# Patient Record
Sex: Female | Born: 1960 | Race: White | Hispanic: No | Smoking: Never smoker
Health system: Southern US, Community
[De-identification: ages and names within clinical notes are randomized; demographics above are authoritative.]

## PROBLEM LIST (undated history)

## (undated) DIAGNOSIS — Z9889 Other specified postprocedural states: Secondary | ICD-10-CM

## (undated) DIAGNOSIS — K589 Irritable bowel syndrome without diarrhea: Secondary | ICD-10-CM

## (undated) DIAGNOSIS — E039 Hypothyroidism, unspecified: Secondary | ICD-10-CM

## (undated) DIAGNOSIS — I10 Essential (primary) hypertension: Secondary | ICD-10-CM

## (undated) DIAGNOSIS — F32A Depression, unspecified: Secondary | ICD-10-CM

## (undated) DIAGNOSIS — R112 Nausea with vomiting, unspecified: Secondary | ICD-10-CM

## (undated) DIAGNOSIS — F329 Major depressive disorder, single episode, unspecified: Secondary | ICD-10-CM

## (undated) HISTORY — DX: Irritable bowel syndrome, unspecified: K58.9

## (undated) HISTORY — DX: Hypothyroidism, unspecified: E03.9

## (undated) HISTORY — DX: Major depressive disorder, single episode, unspecified: F32.9

## (undated) HISTORY — PX: FOOT SURGERY: SHX648

## (undated) HISTORY — DX: Essential (primary) hypertension: I10

## (undated) HISTORY — PX: GASTRIC BYPASS: SHX52

## (undated) HISTORY — PX: HAND SURGERY: SHX662

## (undated) HISTORY — DX: Depression, unspecified: F32.A

---

## 2002-02-09 HISTORY — PX: ESOPHAGOGASTRODUODENOSCOPY: SHX1529

## 2002-02-13 ENCOUNTER — Encounter: Payer: Self-pay | Admitting: Family Medicine

## 2002-02-13 ENCOUNTER — Ambulatory Visit (HOSPITAL_COMMUNITY): Admission: RE | Admit: 2002-02-13 | Discharge: 2002-02-13 | Payer: Self-pay | Admitting: Family Medicine

## 2002-02-17 ENCOUNTER — Encounter (HOSPITAL_COMMUNITY): Admission: RE | Admit: 2002-02-17 | Discharge: 2002-03-19 | Payer: Self-pay | Admitting: Family Medicine

## 2002-02-21 ENCOUNTER — Encounter: Payer: Self-pay | Admitting: Family Medicine

## 2002-03-07 ENCOUNTER — Ambulatory Visit (HOSPITAL_COMMUNITY): Admission: RE | Admit: 2002-03-07 | Discharge: 2002-03-07 | Payer: Self-pay | Admitting: Internal Medicine

## 2007-02-10 HISTORY — PX: CHOLECYSTECTOMY: SHX55

## 2007-08-23 ENCOUNTER — Ambulatory Visit (HOSPITAL_COMMUNITY): Admission: RE | Admit: 2007-08-23 | Discharge: 2007-08-23 | Payer: Self-pay | Admitting: Internal Medicine

## 2007-08-25 ENCOUNTER — Encounter (HOSPITAL_COMMUNITY): Admission: RE | Admit: 2007-08-25 | Discharge: 2007-09-24 | Payer: Self-pay | Admitting: Internal Medicine

## 2007-09-14 ENCOUNTER — Ambulatory Visit (HOSPITAL_COMMUNITY): Admission: RE | Admit: 2007-09-14 | Discharge: 2007-09-14 | Payer: Self-pay | Admitting: General Surgery

## 2007-09-14 ENCOUNTER — Encounter (INDEPENDENT_AMBULATORY_CARE_PROVIDER_SITE_OTHER): Payer: Self-pay | Admitting: General Surgery

## 2007-09-18 ENCOUNTER — Emergency Department (HOSPITAL_COMMUNITY): Admission: EM | Admit: 2007-09-18 | Discharge: 2007-09-18 | Payer: Self-pay | Admitting: Emergency Medicine

## 2010-02-21 ENCOUNTER — Ambulatory Visit (HOSPITAL_COMMUNITY)
Admission: RE | Admit: 2010-02-21 | Discharge: 2010-02-21 | Payer: Self-pay | Source: Home / Self Care | Attending: Family Medicine | Admitting: Family Medicine

## 2010-06-24 NOTE — Op Note (Signed)
NAMELORAL, CAMPI NO.:  [id number]   MEDICAL RECORD NO.:  [id number]          PATIENT TYPE:  AMB   LOCATION:  DAY                           FACILITY:  [facility]   PHYSICIAN:  Tilford Pillar, MD      DATE OF BIRTH:  January 29, 1961   DATE OF PROCEDURE:  09/14/2007  DATE OF DISCHARGE:                               OPERATIVE REPORT   PREOPERATIVE DIAGNOSIS:  Biliary dyskinesia.   POSTOPERATIVE DIAGNOSIS:  Biliary dyskinesia.   PROCEDURE:  Laparoscopic cholecystectomy.   SURGEON:  Tilford Pillar, MD   ANESTHESIA:  General endotracheal, local anesthetic, 0.5% Sensorcaine  plain.   SPECIMEN:  Gallbladder.   ESTIMATED BLOOD LOSS:  Minimal.   INDICATIONS:  The patient is a 50 year old female who presented to my  office with a history of epigastric abdominal pain, nausea, and  vomiting.  She has had extensive workup for her symptomatology and had a  negative right upper quadrant ultrasound, but had a HIDA evaluation  which demonstrated a diminished gallbladder ejection fraction.  The  risks, benefits, and alternatives of a laparoscopic, possible open,  cholecystectomy were discussed at length with the patient including, but  not limited to the risk of bleeding, infection, bile leak, small bowel  injury as well as possibility of common bile duct injury.  The patient's  questions and concerns were addressed and the patient was consented for  the planned procedure.   OPERATION:  The patient was taken to the operating room and was placed  in the supine position on the operating table; at which time, general  anesthetic was administered.  Once the patient was asleep, she was  endotracheally intubated by Anesthesia.  At this point, her abdomen was  prepped with DuraPrep solution and draped in standard fashion.  A stab  incision was created supraumbilically.  Additional dissection down  through the subcuticular tissue was carried out using a Kocher clamp.  It was utilized to  grasp the anterior abdominal wall fascia anteriorly.  A Veress needle was inserted.  Saline drop test was utilized to confirm  intraperitoneal placement and then pneumoperitoneum was initiated.  Once  sufficient pneumoperitoneum was initiated, 11-mm trocar was inserted  over the laparoscope allowing visualization of the trocar entering into  the peritoneum.  At this point, the inner cannula was removed.  The  laparoscope was reinserted.  There was no evidence of any trocar or  Veress needle placement injury.  At this time, the remaining trocars  were placed with a 5-mm trocar in the right lateral abdominal wall and  11-mm trocar in the epigastrium, and 5-mm trocar in the mid portion  between the two 11-mm trocars.  At this point, the fundus of the  gallbladder was grasped and lifted up and over the liver.  Blunt  dissection was utilized to bluntly strip the peritoneal reflection off  the infundibulum of the gallbladder, exposing the cystic duct as we  entered into the infundibulum.  A window was created behind the cystic  duct.  Three Endoclips were placed proximally and one distally, and the  cystic  duct was divided between two most distal clips.  Similarly, the  cystic artery was identified.  A window was created  behind the cystic  artery.  Two clips were placed proximally and one distally.   Cystic artery was divided between two most distal clips.  At this point,  electrocautery was utilized to dissect the gallbladder free from the  gallbladder fossa.  Once, the gallbladder was free and it was placed in  the EndoCatch bag.  There was a small cholecystotomy created during  dissection resulting in a small amount of bile spillage.  Therefore, a  suction irrigator was brought to the field.  It was utilized to aspirate  some bile and irrigated with sterile saline until the returning aspirate  was clear.  The gallbladder __________ EndoCatch bag over the right lobe  of the liver.  The  attention was then turned to closure, a 2-0 Vicryl  was placed using an Endoclose suture passing device to close the 11-mm  trocar site.  With these sutures in place, the gallbladder fossa was  reinspected.  There was no evidence of any hemorrhage. A piece of  Surgicel was placed in the gallbladder fossa and the gallbladder was  removed through the epigastric trocar site in an intact EndoCatch bag,  which was placed in the back table and sent as a permanent specimen to  pathology.  At this point, the trocars were removed.  The Vicryl sutures  secured.  The local anesthetic was instilled.  The interlocking guide  was placed.  The skin edges were reapproximated at all 4 trocar sites  with a 4-0 Monocryl and running subcuticular suture.  The skin was  washed, dried, with moist dry towel.  Benzoin was applied around the  incision.  Half-inch Steri-Strips were placed.  The patient was allowed  to come out of general anesthetic.  She was transferred back to regular  hospital bed in stable condition.  At the conclusion of the procedure,  all instrument, sponge, and needle counts were correct.  The patient  tolerated the procedure well.      Tilford Pillar, MD  Electronically Signed     BZ/MEDQ  D:  09/14/2007  T:  09/15/2007  Job:  [id number]   cc:   [facility]

## 2010-06-24 NOTE — H&P (Signed)
[name], [name] NO.:  [id number]   MEDICAL RECORD NO.:  [id number]          PATIENT TYPE:  AMB   LOCATION:  DAY                           FACILITY:  [facility]   PHYSICIAN:  Tilford Pillar, MD      DATE OF BIRTH:  Jun 16, 1960   DATE OF ADMISSION:  DATE OF DISCHARGE:  LH                              HISTORY & PHYSICAL   CHIEF COMPLAINT:  Nausea and abdominal pain.   HISTORY OF PRESENT ILLNESS:  The patient is a 50 year old female who was  referred to my office by Dr. Elsie Ra and following after a several-year  history of epigastric abdominal pain and nausea.  This is a postprandial  nausea which she develops after a few minutes after eating, describes it  as an epigastric pain, which is sharp, constant, and does not radiate to  any other areas.  It is worsened by eating fatty greasy foods, spicy  foods, as well as green leafy vegetables.  She states that the symptoms  will get somewhat better with Phenergan, particularly the nausea.  She  denies any fever or chills.  She has had no history of bowel change, no  melena, no hematochezia, and no history of jaundice.  She does state  that she was told she had had IBS in the past, although she states that  these symptoms are different than her symptoms she associated with IBS.   PAST MEDICAL HISTORY:  1. Gastroesophageal reflux disease.  2. Hypothyroidism.  3. Chronic nausea.  4. Depression and anxiety.   PAST SURGICAL HISTORY:  Multiple, including multiple surgeries on her  left foot, her right hand, as well as 2 cesarean sections.   MEDICATIONS:  1. Phenergan.  2. Zofran.  3. Lexapro.   ALLERGIES:  She is allergic to LATEX.  She states she is allergic to  CHOCOLATE and SYNTHROID.   SOCIAL HISTORY:  No tobacco use.  No alcohol use.  Occupation does not  involve any heavy lifting.  She has had 2 pregnancies.   PERTINENT FAMILY HISTORY:  She does have a family history of biliary  disease.   REVIEW OF  SYSTEMS:  CONSTITUTIONAL:  Unremarkable.  EYES:  Unremarkable.  EARS, NOSE, AND THROAT:  Unremarkable.  RESPIRATORY:  Unremarkable.  VASCULAR:  Unremarkable.  GASTROINTESTINAL:  Abdominal pain, nausea, and  vomiting as per HPI.  GENITOURINARY:  Unremarkable.  MUSCULOSKELETAL:  Unremarkable.  SKIN:  Unremarkable.  ENDOCRINE:  Unremarkable.  NEUROLOGIC:  Unremarkable.   On physical exam,  GENERAL:  The patient is a morbidly obese female.  She is alert and  oriented x3.  She is not in any acute distress.  HEENT:  Scalp, no deformities and no masses.  Eyes, pupils are equal,  round, and reactive to light.  Extraocular movements are intact.  No  conjunctival pallor or scleral icterus noted.  Oral mucosa is pink.  Normal occlusion.  NECK:  Trachea is midline.  No thyroid nodularity is noted.  No cervical  lymphadenopathy.  PULMONARY:  Unlabored respirations.  No wheezes and no crackles.  She is  clear  to auscultation bilaterally.  CARDIOVASCULAR:  Regular rate and  rhythm.  She has 2+ radial pulses bilaterally and 2+ dorsalis pedis  pulses bilaterally.  ABDOMEN:  Positive bowel sounds.  Abdomen is soft and nontender.  No  hernias.  No masses.  SKIN:  Warm and dry.   PERTINENT LABORATORY AND RADIOGRAPHIC STUDIES:  The patient had a  previous right upper quadrant ultrasound, which did not demonstrate any  stone.  She did have a HIDA scan, which demonstrated a 13.7% ejection  fraction.   ASSESSMENT AND PLAN:  Biliary dyskinesia.  At this point, I had a long  conversation in regards to the patient's symptomatology and radiographic  findings.  At this time, I do have a high suspicion that significant  number of her symptoms are related to a biliary etiology and do think  she would benefit from a cholecystectomy.  The risks, benefits, and  alternatives of laparoscopic, possible open cholecystectomy were  discussed at length with the patient including, but not limited to risk  of bleeding,  infection, bile leak, small-bowel injury, common bile duct  injury, as well as possibility of intraoperative cardiac and pulmonary  event.  In addition, it was discussed with the patient that the need for  general anesthetic to proceed with a laparoscopic operation, the patient  did state she has a family history of difficulty with certain types of  anesthetic, has had family members with adverse reactions to the  anesthetic.  In light of her multiple previous operations, she has never  truly had a general anesthetic and therefore, this was discussed with  the patient as a possible reason that a laparoscopic approach may not be  feasible depending on her evaluation by Anesthesia.  At this point, we  will plan to proceed with a laparoscopic possible open cholecystectomy  at the patient's earliest convenience.      Tilford Pillar, MD  Electronically Signed     BZ/MEDQ  D:  09/08/2007  T:  09/09/2007  Job:  [id number]   cc:   [facility]   Short-Stay Surgery

## 2010-06-27 NOTE — Op Note (Signed)
NAME:  [name], [name]                           ACCOUNT NO.:  [id number]   MEDICAL RECORD NO.:  [id number]                   PATIENT TYPE:  AMB   LOCATION:  DAY                                  FACILITY:  [facility]   PHYSICIAN:  R. Roetta Sessions, M.D.              DATE OF BIRTH:  1960/09/30   DATE OF PROCEDURE:  03/07/2002  DATE OF DISCHARGE:                                 OPERATIVE REPORT   PROCEDURE:  Diagnostic esophagogastroduodenoscopy.   INDICATION FOR PROCEDURE:  The patient is a 50 year old lady with a three-  week history of intermittent epigastric and right upper quadrant abdominal  pain with nausea.  She has had some left leg pain and has been taking four  Advil daily for most of this time, although she tells me three days ago she  stopped taking the Advil and was placed on Prevacid 30 mg orally b.i.d. on  02/28/02 through our office.  She says that her abdominal pain has almost  resolved.  EGD is now being done for the above-mentioned symptoms.  This  approach has been discussed with the patient previously.  The potential  risks, benefits, and alternatives have been reviewed, questions answered,  and she is agreeable.  Please see my dictated H&P of 02/28/02 for more  information.  ASA-2.   DESCRIPTION OF PROCEDURE:  O2 saturation, blood pressure, pulse, and  respirations were monitored throughout the entire procedure.   Conscious sedation:  Versed 4 mg IV, Demerol 75 mg IV in divided doses.   Instrument:  Olympus video chip adult gastroscope.   Findings:  Examination of the tubular esophagus revealed no mucosal  abnormalities.  The EG junction was easily traversed.   Stomach:  The gastric cavity was empty and insufflated well with air.  Thorough examination of the gastric mucosa, including a retroflexed view of  the proximal stomach and esophagogastric junction, demonstrated a small  hiatal hernia only.  The pylorus was patent and easily traversed.   Duodenum:  The bulb  and the second portion appeared normal.   Therapeutic/diagnostic maneuvers performed:  None.   The patient tolerated the procedure well and was reactive in endoscopy.   IMPRESSION:  1. Normal esophagus.  2. Small hiatal hernia, otherwise normal stomach.  3. Normal D1, D2.   I suspect the patient's recent symptoms may well be related to nonsteroidal  anti-inflammatory drug intolerance, although she did not have any  morphological changes in her upper GI tract.   RECOMMENDATIONS:  1. Continue to refrain from taking any Advil or other nonsteroidals.  I have     taken the liberty of giving her a prescription for Darvocet-N 100     tablets, one b.i.d., #30 with one refill for leg pain.  2. Decrease Prevacid to 30 mg once orally in the morning.  3. Follow-up visit with [place] in four weeks to see how she is doing.  [name], M.D.    RMR/MEDQ  D:  03/07/2002  T:  03/07/2002  Job:  [id number]   cc:   Corrie Mckusick, M.D.  [address] [place]. [place]  [place]  [place] [address]  Fax: [phone]

## 2010-06-27 NOTE — Consult Note (Signed)
NAME:  [name], [name]                           ACCOUNT NO.:  [id number]   MEDICAL RECORD NO.:  [id number]                   PATIENT TYPE:   LOCATION:                                       FACILITY:  [facility]   PHYSICIAN:  R. Roetta Sessions, M.D.              DATE OF BIRTH:  May 04, 1960   DATE OF CONSULTATION:  02/28/2002  DATE OF DISCHARGE:                                   CONSULTATION   REASON FOR CONSULTATION:  Abdominal pain, nausea.   HISTORY OF PRESENT ILLNESS:  The patient is a pleasant 50 year old Caucasian  female, a patient of Dr. Corrie Mckusick, who presents today for further  evaluation of abdominal pain and nausea.  The patient states the first of  January she had eaten a fish sandwich at a restaurant; her husband also ate  the same meal.  She woke up early the next morning, had severe upper  abdominal pain associated with nausea but no vomiting; her husband did not  have any of these symptoms.  She took Pepto-Bismol which seemed to ease her  symptoms.  Her abdominal pain was primarily in the epigastric and right  upper quadrant.  Symptoms were intermittent for several days.  She  eventually saw Dr. Phillips Odor several days later, who arranged for workup.  She  had an abdominal ultrasound which revealed a normal gallbladder, common bile  duct measuring 3.5 mm.  There was a left renal cyst, otherwise, exam was  normal.  She had normal LFTs at that time; lipase was slightly elevated at  83.  On February 21, 2002, she underwent HIDA scan with fatty meal challenge.  This revealed normal gallbladder ejection fraction of 51% and no evidence of  cystic duct or common bile duct obstruction.  She had very mild  enterogastric reflux at the end of the fatty meal challenge.  The patient  tells me she actually had to do the HIDA scan on two occasions; the first  occasion, she was unable to drink the Half-and-Half due to nausea; the  second exam, she did not have any reproduction of her  symptoms.  Repeat  blood work on February 20, 2002 revealed normal WBC, hemoglobin, LFTs,  amylase and lipase; she also had negative H. pylori serologies.  She started  taking her husband's Prevacid twice a day; she noticed improvement of her  symptoms.  She continues to eat very bland diet for fear of recurrent  symptoms.  She has had a lot of excessive belching.  She continues to take  Pepto-Bismol p.r.n.  Her stools are dark on Pepto-Bismol.  Denies any bright  red blood per rectum, diarrhea, constipation, dysphagia or odynophagia.  Bowels usually move once daily.   CURRENT MEDICATIONS:  1. Benadryl Cold and Flu two q.a.m., two q.p.m.  2. Advil two tablets daily.  3. Pepto-Bismol p.r.n.  4. Prevacid one b.i.d. (taking husband's prescription).  ALLERGIES:  No known drug allergies.   PAST MEDICAL HISTORY:  Chronic sinusitis/allergies.  Chronic left foot pain.   PAST SURGICAL HISTORY:  Cesarean section x2.   FAMILY HISTORY:  Family history negative for colorectal cancer or chronic GI  illnesses.   SOCIAL HISTORY:  She has been married for eight years and has two children.  She is employed with [employer].  She has never been a smoker.  Denies any  alcohol use.   REVIEW OF SYSTEMS:  Please see HPI for GI.  GENERAL:  Denies any weight  loss.  CARDIOPULMONARY:  Denies any chest pain or shortness of breath.  GENITOURINARY:  Denies any hematuria or dysuria.   PHYSICAL EXAMINATION:  VITAL SIGNS:  Weight 290.  Height 5 feet 10 inches.  Temperature 98.3, blood pressure 140/94, pulse 74.  GENERAL:  Pleasant, morbidly obese Caucasian female in no acute distress.  SKIN:  Skin warm and dry, no jaundice.  HEENT:  Pupils are equal, round and reactive to light.  Conjunctivae are  pink.  Sclerae nonicteric.  Oropharynx mucosa moist and pink.  No lesions,  erythema or exudates.  NECK:  No lymphadenopathy, thyromegaly or carotid bruits.  CHEST:  Lungs are clear to auscultation.  CARDIAC:   Exam reveals a regular rate and rhythm, normal S1 and S2, no  murmurs, rubs, or gallops.  ABDOMEN:  Positive bowel sounds.  Obese but symmetrical and soft.  She has  mild-to-moderate tenderness in the epigastric region to deep palpation.  No  organomegaly or masses.  Negative Murphy's sign.  No guarding.  EXTREMITIES:  No edema.   LABORATORY DATA:  Most current on February 20, 2002:  WBC 5.5, hemoglobin  14.4, hematocrit 45.5, platelets 277,000.  Total bilirubin 0.4, alkaline  phosphatase 106, SGOT 18, SGPT 22, albumin 4.2, amylase 26, lipase 23.   IMPRESSION:  The patient is a pleasant 50 year old lady who has  approximately a three-week history of intermittent epigastric/right upper  quadrant abdominal pain associated with nausea.  Symptoms are not  necessarily related to meals.  She has had no vomiting.  Abdominal  ultrasound and HIDA scan were unremarkable.  She did have a slightly  elevated lipase on one occasion, however, this is most likely nonspecific.  I suspect that her symptoms could be due to poorly controlled acid reflux  disease.  I cannot rule out peptic ulcer disease, given chronic use of  Advil.  It is reassuring that she is feeling better now taking Prevacid.   PLAN:  1. EGD in the near future.  2. Prevacid 30 mg one tablet p.o. daily to b.i.d., #30 samples given.  3. I discussed risks, alternatives and benefits of EGD with patient and she     is agreeable to proceed.   I would like to thank Dr. Phillips Odor for allowing [place] to take part in the care  of this patient.     Tana Coast, Pricilla Larsson, M.D.    LL/MEDQ  D:  02/28/2002  T:  03/01/2002  Job:  [id number]   cc:   Corrie Mckusick, M.D.  [address] [place]. [place]  [place]  [place] [address]  Fax: [phone]

## 2010-11-07 LAB — CBC
HCT: 35.8 — ABNORMAL LOW
Hemoglobin: 11.7 — ABNORMAL LOW
MCV: 78
MCV: 78.2
Platelets: 249
Platelets: 251
RBC: 4.57
RBC: 4.87
WBC: 5.4
WBC: 7.3

## 2010-11-07 LAB — COMPREHENSIVE METABOLIC PANEL
Albumin: 3.4 — ABNORMAL LOW
Alkaline Phosphatase: 111
BUN: 9
CO2: 28
Chloride: 100
GFR calc non Af Amer: 57 — ABNORMAL LOW
Potassium: 4.1
Total Bilirubin: 0.8

## 2010-11-07 LAB — DIFFERENTIAL
Basophils Absolute: 0
Basophils Relative: 0
Eosinophils Relative: 0
Monocytes Absolute: 0.2
Neutro Abs: 6.4

## 2010-11-07 LAB — URINALYSIS, ROUTINE W REFLEX MICROSCOPIC
Bilirubin Urine: NEGATIVE
Hgb urine dipstick: NEGATIVE
Protein, ur: NEGATIVE
Urobilinogen, UA: 0.2

## 2010-11-07 LAB — BASIC METABOLIC PANEL
BUN: 10
Calcium: 9.8
Creatinine, Ser: 1.47 — ABNORMAL HIGH
GFR calc Af Amer: 46 — ABNORMAL LOW
GFR calc non Af Amer: 38 — ABNORMAL LOW

## 2010-11-07 LAB — LIPASE, BLOOD: Lipase: 16

## 2011-03-27 ENCOUNTER — Other Ambulatory Visit (HOSPITAL_COMMUNITY): Payer: Self-pay | Admitting: Family Medicine

## 2011-03-27 DIAGNOSIS — Z139 Encounter for screening, unspecified: Secondary | ICD-10-CM

## 2011-03-30 ENCOUNTER — Ambulatory Visit (HOSPITAL_COMMUNITY)
Admission: RE | Admit: 2011-03-30 | Discharge: 2011-03-30 | Disposition: A | Discharge status: Home / Self Care | Source: Ambulatory Visit | Attending: Family Medicine | Admitting: Family Medicine

## 2011-03-30 DIAGNOSIS — Z139 Encounter for screening, unspecified: Secondary | ICD-10-CM

## 2011-03-30 DIAGNOSIS — Z1231 Encounter for screening mammogram for malignant neoplasm of breast: Secondary | ICD-10-CM | POA: Insufficient documentation

## 2011-04-29 ENCOUNTER — Ambulatory Visit (HOSPITAL_COMMUNITY)
Admission: RE | Admit: 2011-04-29 | Discharge: 2011-04-29 | Disposition: A | Discharge status: Home / Self Care | Source: Ambulatory Visit | Attending: Family Medicine | Admitting: Family Medicine

## 2011-04-29 ENCOUNTER — Other Ambulatory Visit (HOSPITAL_COMMUNITY): Payer: Self-pay | Admitting: Family Medicine

## 2011-04-29 DIAGNOSIS — I517 Cardiomegaly: Secondary | ICD-10-CM | POA: Insufficient documentation

## 2011-04-29 DIAGNOSIS — R079 Chest pain, unspecified: Secondary | ICD-10-CM

## 2011-06-04 ENCOUNTER — Telehealth: Payer: Self-pay

## 2011-06-04 ENCOUNTER — Other Ambulatory Visit: Payer: Self-pay

## 2011-06-04 DIAGNOSIS — Z139 Encounter for screening, unspecified: Secondary | ICD-10-CM

## 2011-06-04 NOTE — Telephone Encounter (Signed)
LMOM to call. ( needs OV first due to meds)

## 2011-06-05 ENCOUNTER — Encounter: Payer: Self-pay | Admitting: Gastroenterology

## 2011-06-08 ENCOUNTER — Ambulatory Visit: Admitting: Gastroenterology

## 2011-06-09 ENCOUNTER — Encounter: Payer: Self-pay | Admitting: Gastroenterology

## 2011-06-09 ENCOUNTER — Ambulatory Visit (INDEPENDENT_AMBULATORY_CARE_PROVIDER_SITE_OTHER): Admitting: Gastroenterology

## 2011-06-09 VITALS — BP 110/70 | HR 75 | Temp 97.2°F | Ht 70.0 in | Wt 311.6 lb

## 2011-06-09 DIAGNOSIS — K589 Irritable bowel syndrome without diarrhea: Secondary | ICD-10-CM | POA: Insufficient documentation

## 2011-06-09 MED ORDER — DICYCLOMINE HCL 20 MG PO TABS: 20.0000 mg | ORAL_TABLET | Freq: Three times a day (TID) | ORAL | Status: DC | PRN

## 2011-06-09 MED ORDER — PEG-KCL-NACL-NASULF-NA ASC-C 100 G PO SOLR: 1.0000 | Freq: Once | ORAL | Status: DC

## 2011-06-09 NOTE — Patient Instructions (Signed)
We have set you up for a colonoscopy with Dr. Darrick Penna in the near future.  I have also sent a prescription for Bentyl to your pharmacy. Take this up to 3 times a day before meals. Please call us if this is working for you.   We will see you back in 3 months.

## 2011-06-09 NOTE — Progress Notes (Signed)
Referring Provider: Colette Ribas, MD Primary Care Physician:  Colette Ribas, MD, MD Primary Gastroenterologist:  Dr. Darrick Penna   Chief Complaint  Patient presents with  . Colonoscopy    HPI:   51 year old presents today for initial screening colonoscopy. Actually in work-up for bariatric surgery at [facility]. Has to go through 6 mos diet/exercise program due to [facility] requirements. Wants the sleeve. Denies any abdominal pain, constipation. Has chronic diarrhea, postprandial.  Unable to eat spicy, fried, greasy foods, yogurt. Will have lower abdominal cramping, like twisting. Started 8 years ago when husband got sick (passed away from [employer]). Max amount of loose stool is 2-3 times per day. No melena, hematochezia.  Past Medical History  Diagnosis Date  . Hypertension   . Hypothyroidism   . IBS (irritable bowel syndrome)   . Depression     started Lexapro when husband passed away    Past Surgical History  Procedure Date  . Foot surgery     x 5 right  . Hand surgery     x2 right  . Cesarean section     x 2  . Cholecystectomy   . Esophagogastroduodenoscopy Jan 2004    RMR: normal esophagus, small hiatal hernia, normal D1, D2    Current Outpatient Prescriptions  Medication Sig Dispense Refill  . ALPRAZolam (XANAX) 0.5 MG tablet Take 0.5 mg by mouth at bedtime as needed.      Marland Kitchen escitalopram (LEXAPRO) 20 MG tablet Take 20 mg by mouth daily.      Marland Kitchen HYDROcodone-acetaminophen (VICODIN) 5-500 MG per tablet Take 1 tablet by mouth every 6 (six) hours as needed.      Marland Kitchen levothyroxine (SYNTHROID, LEVOTHROID) 150 MCG tablet Take 150 mcg by mouth daily.      Marland Kitchen olmesartan-hydrochlorothiazide (BENICAR HCT) 20-12.5 MG per tablet Take 1 tablet by mouth daily.      . promethazine (PHENERGAN) 25 MG tablet Take 25 mg by mouth every 6 (six) hours as needed.      . dicyclomine (BENTYL) 20 MG tablet Take 1 tablet (20 mg total) by mouth 3 (three) times daily as needed. For diarrhea and  cramping  30 tablet  1  . peg 3350 powder (MOVIPREP) 100 G SOLR Take 1 kit (100 g total) by mouth once. As directed Please purchase 1 Fleets enema to use with the prep  1 kit  0    Allergies as of 06/09/2011 - Review Complete 06/09/2011  Allergen Reaction Noted  . Synthroid (levothyroxine sodium) Itching and Swelling 06/09/2011    Family History  Problem Relation Age of Onset  . Colon cancer Neg Hx     History   Social History  . Marital Status: Widowed    Spouse Name: N/A    Number of Children: N/A  . Years of Education: N/A   Occupational History  . Not on file.   Social History Main Topics  . Smoking status: Never Smoker   . Smokeless tobacco: Not on file  . Alcohol Use: No  . Drug Use: No  . Sexually Active: Not on file   Other Topics Concern  . Not on file   Social History Narrative   Husband passed away 8 years ago from [employer].     Review of Systems: Gen: Denies any fever, chills, loss of appetite, fatigue, weight loss. CV: Denies chest pain, heart palpitations, syncope, peripheral edema. Resp: Denies shortness of breath with rest, cough, wheezing GI: Denies dysphagia or odynophagia. Denies hematemesis, fecal incontinence,  or jaundice.  GU : Denies urinary burning, urinary frequency, urinary incontinence.  MS: Denies joint pain, muscle weakness, cramps, limited movement Derm: Denies rash, itching, dry skin Psych: Denies depression, anxiety, confusion or memory loss  Heme: Denies bruising, bleeding, and enlarged lymph nodes.  Physical Exam: BP 110/70  Pulse 75  Temp(Src) 97.2 F (36.2 C) (Temporal)  Ht 5\' 10"  (1.778 m)  Wt 311 lb 9.6 oz (141.341 kg)  BMI 44.71 kg/m2  LMP 04/28/2011 BMI 44 General:   Alert and oriented. Well-developed, well-nourished, pleasant and cooperative. Head:  Normocephalic and atraumatic. Eyes:  Conjunctiva pink, sclera clear, no icterus.    Ears:  Normal auditory acuity. Nose:  No deformity, discharge,  or  lesions. Mouth:  No deformity or lesions, mucosa pink and moist.  Neck:  Supple, without mass or thyromegaly. Lungs:  Clear to auscultation bilaterally, without wheezing, rales, or rhonchi.  Heart:  S1, S2 present without murmurs noted.  Abdomen:  Largely obese, +BS, soft, non-tender and non-distended. Without mass or HSM. No rebound or guarding. No hernias noted. Rectal:  Deferred  Msk:  Symmetrical without gross deformities. Normal posture. Extremities:  Without clubbing or edema. Neurologic:  Alert and  oriented x4;  grossly normal neurologically. Skin:  Intact, warm and dry without significant lesions or rashes Cervical Nodes:  No significant cervical adenopathy. Psych:  Alert and cooperative. Normal mood and affect.

## 2011-06-09 NOTE — Assessment & Plan Note (Signed)
51 year old female with reported hx of IBS-D. Symptoms present prior to chole in 2008. Notes postprandial loose stools, particularly dependent on type of food (greasy, fatty, spicy). No rectal bleeding. +abdominal cramping prior to loose stools. Symptoms consistent with IBS. No wt loss, lack of appetite, no upper GI symptoms. Trial of Bentyl. Proceed with initial screening TCS.   Proceed with colonoscopy in the OR with Propofol due to polypharmacy with Dr. Darrick Penna in the near future. The risks, benefits, and alternatives have been discussed in detail with the patient. They state understanding and desire to proceed.  BMI 44: in work-up for bariatric surgery at [facility]. Reportedly has lost 30 lbs thus far through lifestyle changes.

## 2011-06-09 NOTE — Progress Notes (Signed)
Faxed to PCP

## 2011-06-11 ENCOUNTER — Other Ambulatory Visit: Payer: Self-pay | Admitting: Adult Health

## 2011-06-11 ENCOUNTER — Other Ambulatory Visit (HOSPITAL_COMMUNITY)
Admission: RE | Admit: 2011-06-11 | Discharge: 2011-06-11 | Disposition: A | Discharge status: Home / Self Care | Source: Ambulatory Visit | Attending: Obstetrics and Gynecology | Admitting: Obstetrics and Gynecology

## 2011-06-11 DIAGNOSIS — Z1159 Encounter for screening for other viral diseases: Secondary | ICD-10-CM | POA: Insufficient documentation

## 2011-06-11 DIAGNOSIS — Z01419 Encounter for gynecological examination (general) (routine) without abnormal findings: Secondary | ICD-10-CM | POA: Insufficient documentation

## 2011-06-18 NOTE — Progress Notes (Signed)
TCS JUN 4

## 2011-06-26 ENCOUNTER — Ambulatory Visit: Admit: 2011-06-26 | Admitting: Gastroenterology

## 2011-06-26 SURGERY — COLONOSCOPY
Anesthesia: Moderate Sedation

## 2011-07-02 ENCOUNTER — Encounter (HOSPITAL_COMMUNITY): Payer: Self-pay | Admitting: Pharmacy Technician

## 2011-07-03 NOTE — Patient Instructions (Addendum)
PATIENT INSTRUCTIONS POST-ANESTHESIA  IMMEDIATELY FOLLOWING SURGERY:  Do not drive or operate machinery for the first twenty four hours after surgery.  Do not make any important decisions for twenty four hours after surgery or while taking narcotic pain medications or sedatives.  If you develop intractable nausea and vomiting or a severe headache please notify your doctor immediately.  FOLLOW-UP:  Please make an appointment with your surgeon as instructed. You do not need to follow up with anesthesia unless specifically instructed to do so.  WOUND CARE INSTRUCTIONS (if applicable):  Keep a dry clean dressing on the anesthesia/puncture wound site if there is drainage.  Once the wound has quit draining you may leave it open to air.  Generally you should leave the bandage intact for twenty four hours unless there is drainage.  If the epidural site drains for more than 36-48 hours please call the anesthesia department.  QUESTIONS?:  Please feel free to call your physician or the hospital operator if you have any questions, and they will be happy to assist you.     20 [name]  07/03/2011   Your procedure is scheduled on:   07/14/2011  Report to [facility] at  615  AM.  Call this number if you have problems the morning of surgery: [phone]   Remember:   Do not eat food:After Midnight.  May have clear liquids:until Midnight .    Take these medicines the morning of surgery with A SIP OF WATER: xanax,vicodin,lexapro,synthroid,benicar,phenergan   Do not wear jewelry, make-up or nail polish.  Do not wear lotions, powders, or perfumes. You may wear deodorant.  Do not shave 48 hours prior to surgery. Men may shave face and neck.  Do not bring valuables to the hospital.  Contacts, dentures or bridgework may not be worn into surgery.  Leave suitcase in the car. After surgery it may be brought to your room.  For patients admitted to the hospital, checkout time is 11:00 AM the day of  discharge.   Patients discharged the day of surgery will not be allowed to drive home.  Name and phone number of your driver:family  Special Instructions: N/A   Please read over the following fact sheets that you were given: Pain Booklet, Surgical Site Infection Prevention, Anesthesia Post-op Instructions and Care and Recovery After Surgery Colonoscopy A colonoscopy is an exam to evaluate your entire colon. In this exam, your colon is cleansed. A long fiberoptic tube is inserted through your rectum and into your colon. The fiberoptic scope (endoscope) is a long bundle of enclosed and very flexible fibers. These fibers transmit light to the area examined and send images from that area to your caregiver. Discomfort is usually minimal. You may be given a drug to help you sleep (sedative) during or prior to the procedure. This exam helps to detect lumps (tumors), polyps, inflammation, and areas of bleeding. Your caregiver may also take a small piece of tissue (biopsy) that will be examined under a microscope. LET YOUR CAREGIVER KNOW ABOUT:   Allergies to food or medicine.   Medicines taken, including vitamins, herbs, eyedrops, over-the-counter medicines, and creams.   Use of steroids (by mouth or creams).   Previous problems with anesthetics or numbing medicines.   History of bleeding problems or blood clots.   Previous surgery.   Other health problems, including diabetes and kidney problems.   Possibility of pregnancy, if this applies.  BEFORE THE PROCEDURE   A clear liquid diet may be required  for 2 days before the exam.   Ask your caregiver about changing or stopping your regular medications.   Liquid injections (enemas) or laxatives may be required.   A large amount of electrolyte solution may be given to you to drink over a short period of time. This solution is used to clean out your colon.   You should be present 60 minutes prior to your procedure or as directed by your  caregiver.  AFTER THE PROCEDURE   If you received a sedative or pain relieving medication, you will need to arrange for someone to drive you home.   Occasionally, there is a little blood passed with the first bowel movement. Do not be concerned.  FINDING OUT THE RESULTS OF YOUR TEST Not all test results are available during your visit. If your test results are not back during the visit, make an appointment with your caregiver to find out the results. Do not assume everything is normal if you have not heard from your caregiver or the medical facility. It is important for you to follow up on all of your test results. HOME CARE INSTRUCTIONS   It is not unusual to pass moderate amounts of gas and experience mild abdominal cramping following the procedure. This is due to air being used to inflate your colon during the exam. Walking or a warm pack on your belly (abdomen) may help.   You may resume all normal meals and activities after sedatives and medicines have worn off.   Only take over-the-counter or prescription medicines for pain, discomfort, or fever as directed by your caregiver. Do not use aspirin or blood thinners if a biopsy was taken. Consult your caregiver for medicine usage if biopsies were taken.  SEEK IMMEDIATE MEDICAL CARE IF:   You have a fever.   You pass large blood clots or fill a toilet with blood following the procedure. This may also occur 10 to 14 days following the procedure. This is more likely if a biopsy was taken.   You develop abdominal pain that keeps getting worse and cannot be relieved with medicine.  Document Released: 01/24/2000 Document Revised: 01/15/2011 Document Reviewed: 09/08/2007 [facility] Patient Information 2012 [place], [place].

## 2011-07-07 ENCOUNTER — Encounter (HOSPITAL_COMMUNITY): Payer: Self-pay

## 2011-07-07 ENCOUNTER — Encounter (HOSPITAL_COMMUNITY)
Admission: RE | Admit: 2011-07-07 | Discharge: 2011-07-07 | Disposition: A | Discharge status: Home / Self Care | Source: Ambulatory Visit | Attending: Gastroenterology | Admitting: Gastroenterology

## 2011-07-07 HISTORY — DX: Other specified postprocedural states: R11.2

## 2011-07-07 HISTORY — DX: Other specified postprocedural states: Z98.890

## 2011-07-07 LAB — HEMOGLOBIN AND HEMATOCRIT, BLOOD
HCT: 43.5 % (ref 36.0–46.0)
Hemoglobin: 13.6 g/dL (ref 12.0–15.0)

## 2011-07-07 LAB — BASIC METABOLIC PANEL
CO2: 28 mEq/L (ref 19–32)
Calcium: 10.4 mg/dL (ref 8.4–10.5)
Chloride: 101 mEq/L (ref 96–112)
Glucose, Bld: 100 mg/dL — ABNORMAL HIGH (ref 70–99)
Sodium: 139 mEq/L (ref 135–145)

## 2011-07-10 ENCOUNTER — Other Ambulatory Visit: Payer: Self-pay | Admitting: Obstetrics and Gynecology

## 2011-07-14 ENCOUNTER — Ambulatory Visit (HOSPITAL_COMMUNITY): Admitting: Anesthesiology

## 2011-07-14 ENCOUNTER — Encounter (HOSPITAL_COMMUNITY): Payer: Self-pay | Admitting: *Deleted

## 2011-07-14 ENCOUNTER — Encounter (HOSPITAL_COMMUNITY): Payer: Self-pay | Admitting: Anesthesiology

## 2011-07-14 ENCOUNTER — Ambulatory Visit (HOSPITAL_COMMUNITY)
Admission: RE | Admit: 2011-07-14 | Discharge: 2011-07-14 | Disposition: A | Discharge status: Home / Self Care | Source: Ambulatory Visit | Attending: Gastroenterology | Admitting: Gastroenterology

## 2011-07-14 ENCOUNTER — Encounter (HOSPITAL_COMMUNITY)
Admission: RE | Disposition: A | Discharge status: Home / Self Care | Payer: Self-pay | Source: Ambulatory Visit | Attending: Gastroenterology

## 2011-07-14 DIAGNOSIS — Z1211 Encounter for screening for malignant neoplasm of colon: Secondary | ICD-10-CM | POA: Insufficient documentation

## 2011-07-14 DIAGNOSIS — K648 Other hemorrhoids: Secondary | ICD-10-CM

## 2011-07-14 DIAGNOSIS — Z79899 Other long term (current) drug therapy: Secondary | ICD-10-CM | POA: Insufficient documentation

## 2011-07-14 DIAGNOSIS — I10 Essential (primary) hypertension: Secondary | ICD-10-CM | POA: Insufficient documentation

## 2011-07-14 DIAGNOSIS — K5730 Diverticulosis of large intestine without perforation or abscess without bleeding: Secondary | ICD-10-CM

## 2011-07-14 DIAGNOSIS — Z01812 Encounter for preprocedural laboratory examination: Secondary | ICD-10-CM | POA: Insufficient documentation

## 2011-07-14 SURGERY — COLONOSCOPY WITH PROPOFOL
Anesthesia: Monitor Anesthesia Care

## 2011-07-14 MED ORDER — MIDAZOLAM HCL 5 MG/5ML IJ SOLN
INTRAMUSCULAR | Status: DC | PRN
  Administered 2011-07-14: 2 mg via INTRAVENOUS

## 2011-07-14 MED ORDER — PROPOFOL 10 MG/ML IV BOLUS
INTRAVENOUS | Status: DC | PRN
  Administered 2011-07-14 (×2): 10 mg via INTRAVENOUS

## 2011-07-14 MED ORDER — STERILE WATER FOR IRRIGATION IR SOLN
Status: DC | PRN
  Administered 2011-07-14: 07:00:00

## 2011-07-14 MED ORDER — DEXAMETHASONE SODIUM PHOSPHATE 4 MG/ML IJ SOLN
4.0000 mg | Freq: Once | INTRAMUSCULAR | Status: AC
  Administered 2011-07-14: 4 mg via INTRAVENOUS

## 2011-07-14 MED ORDER — PROPOFOL 10 MG/ML IV EMUL
INTRAVENOUS | Status: AC
  Filled 2011-07-14: qty 20

## 2011-07-14 MED ORDER — ONDANSETRON HCL 4 MG/2ML IJ SOLN
INTRAMUSCULAR | Status: AC
  Administered 2011-07-14: 4 mg via INTRAVENOUS
  Filled 2011-07-14: qty 2

## 2011-07-14 MED ORDER — FENTANYL CITRATE 0.05 MG/ML IJ SOLN: 25.0000 ug | INTRAMUSCULAR | Status: DC | PRN

## 2011-07-14 MED ORDER — GLYCOPYRROLATE 0.2 MG/ML IJ SOLN
INTRAMUSCULAR | Status: AC
  Administered 2011-07-14: 0.2 mg via INTRAVENOUS
  Filled 2011-07-14: qty 1

## 2011-07-14 MED ORDER — MIDAZOLAM HCL 2 MG/2ML IJ SOLN
1.0000 mg | INTRAMUSCULAR | Status: DC | PRN
  Administered 2011-07-14: 2 mg via INTRAVENOUS

## 2011-07-14 MED ORDER — FENTANYL CITRATE 0.05 MG/ML IJ SOLN
INTRAMUSCULAR | Status: AC
  Filled 2011-07-14: qty 2

## 2011-07-14 MED ORDER — ONDANSETRON HCL 4 MG/2ML IJ SOLN
4.0000 mg | Freq: Once | INTRAMUSCULAR | Status: AC
  Administered 2011-07-14: 4 mg via INTRAVENOUS

## 2011-07-14 MED ORDER — FENTANYL CITRATE 0.05 MG/ML IJ SOLN
INTRAMUSCULAR | Status: DC | PRN
  Administered 2011-07-14: 50 ug via INTRAVENOUS

## 2011-07-14 MED ORDER — GLYCOPYRROLATE 0.2 MG/ML IJ SOLN
0.2000 mg | Freq: Once | INTRAMUSCULAR | Status: AC
  Administered 2011-07-14: 0.2 mg via INTRAVENOUS

## 2011-07-14 MED ORDER — MIDAZOLAM HCL 2 MG/2ML IJ SOLN
INTRAMUSCULAR | Status: AC
  Administered 2011-07-14: 2 mg via INTRAVENOUS
  Filled 2011-07-14: qty 2

## 2011-07-14 MED ORDER — LIDOCAINE HCL (PF) 1 % IJ SOLN
INTRAMUSCULAR | Status: AC
  Filled 2011-07-14: qty 5

## 2011-07-14 MED ORDER — ONDANSETRON HCL 4 MG/2ML IJ SOLN: 4.0000 mg | Freq: Once | INTRAMUSCULAR | Status: DC | PRN

## 2011-07-14 MED ORDER — DEXAMETHASONE SODIUM PHOSPHATE 4 MG/ML IJ SOLN
INTRAMUSCULAR | Status: AC
  Administered 2011-07-14: 4 mg via INTRAVENOUS
  Filled 2011-07-14: qty 1

## 2011-07-14 MED ORDER — MIDAZOLAM HCL 2 MG/2ML IJ SOLN
INTRAMUSCULAR | Status: AC
  Filled 2011-07-14: qty 2

## 2011-07-14 MED ORDER — LACTATED RINGERS IV SOLN
INTRAVENOUS | Status: DC
  Administered 2011-07-14: 1000 mL via INTRAVENOUS

## 2011-07-14 MED ORDER — SCOPOLAMINE 1 MG/3DAYS TD PT72
1.0000 | MEDICATED_PATCH | Freq: Once | TRANSDERMAL | Status: DC
  Administered 2011-07-14: 1.5 mg via TRANSDERMAL

## 2011-07-14 MED ORDER — SCOPOLAMINE 1 MG/3DAYS TD PT72
MEDICATED_PATCH | TRANSDERMAL | Status: AC
  Administered 2011-07-14: 1.5 mg via TRANSDERMAL
  Filled 2011-07-14: qty 1

## 2011-07-14 MED ORDER — WATER FOR IRRIGATION, STERILE IR SOLN
Status: DC | PRN
  Administered 2011-07-14: 1000 mL

## 2011-07-14 MED ORDER — PROPOFOL 10 MG/ML IV EMUL
INTRAVENOUS | Status: DC | PRN
  Administered 2011-07-14: 75 ug/kg/min via INTRAVENOUS

## 2011-07-14 SURGICAL SUPPLY — 22 items
ELECT REM PT RETURN 9FT ADLT (ELECTROSURGICAL)
ELECTRODE REM PT RTRN 9FT ADLT (ELECTROSURGICAL) IMPLANT
FCP BXJMBJMB 240X2.8X (CUTTING FORCEPS)
FLOOR PAD 36X40 (MISCELLANEOUS) ×2
FORCEPS BIOP RAD 4 LRG CAP 4 (CUTTING FORCEPS) ×1 IMPLANT
FORCEPS BIOP RJ4 240 W/NDL (CUTTING FORCEPS)
FORCEPS BXJMBJMB 240X2.8X (CUTTING FORCEPS) IMPLANT
INJECTOR/SNARE I SNARE (MISCELLANEOUS) IMPLANT
LUBRICANT JELLY 4.5OZ STERILE (MISCELLANEOUS) ×1 IMPLANT
MANIFOLD NEPTUNE II (INSTRUMENTS) ×1 IMPLANT
NDL SCLEROTHERAPY 25GX240 (NEEDLE) IMPLANT
NEEDLE SCLEROTHERAPY 25GX240 (NEEDLE) IMPLANT
PAD FLOOR 36X40 (MISCELLANEOUS) ×1 IMPLANT
PROBE APC STR FIRE (PROBE) IMPLANT
PROBE INJECTION GOLD (MISCELLANEOUS)
PROBE INJECTION GOLD 7FR (MISCELLANEOUS) IMPLANT
SNARE SHORT THROW 13M SML OVAL (MISCELLANEOUS) ×1 IMPLANT
SYR 50ML LL SCALE MARK (SYRINGE) ×1 IMPLANT
TRAP SPECIMEN MUCOUS 40CC (MISCELLANEOUS) IMPLANT
TUBING ENDO SMARTCAP PENTAX (MISCELLANEOUS) IMPLANT
TUBING IRRIGATION ENDOGATOR (MISCELLANEOUS) ×1 IMPLANT
WATER STERILE IRR 1000ML POUR (IV SOLUTION) ×2 IMPLANT

## 2011-07-14 NOTE — Anesthesia Preprocedure Evaluation (Addendum)
Anesthesia Evaluation  Patient identified by MRN, date of birth, ID band Patient awake    Reviewed: Allergy & Precautions, H&P , NPO status , Patient's Chart, lab work & pertinent test results  History of Anesthesia Complications (+) PONV  Airway Mallampati: I      Dental  (+) Teeth Intact   Pulmonary neg pulmonary ROS,  breath sounds clear to auscultation        Cardiovascular hypertension, Pt. on medications Rhythm:Regular     Neuro/Psych PSYCHIATRIC DISORDERS Depression    GI/Hepatic GERD-  Medicated and Controlled,  Endo/Other  Hypothyroidism Morbid obesity  Renal/GU      Musculoskeletal   Abdominal   Peds  Hematology   Anesthesia Other Findings   Reproductive/Obstetrics                           Anesthesia Physical Anesthesia Plan  ASA: II  Anesthesia Plan: MAC   Post-op Pain Management:    Induction: Intravenous  Airway Management Planned: Simple Face Mask  Additional Equipment:   Intra-op Plan:   Post-operative Plan:   Informed Consent: I have reviewed the patients History and Physical, chart, labs and discussed the procedure including the risks, benefits and alternatives for the proposed anesthesia with the patient or authorized representative who has indicated his/her understanding and acceptance.     Plan Discussed with:   Anesthesia Plan Comments:         Anesthesia Quick Evaluation

## 2011-07-14 NOTE — Discharge Instructions (Signed)
You have small internal hemorrhoids and diverticulosis IN YOUR LEFT COLON.   FOLLOW A HIGH FIBER DIET. AVOID ITEMS THAT CAUSE BLOATING & GAS. SEE INFO BELOW. USE BENTYL AS NEEDED FOR DIARRHEA OR ABDOMINAL CRAMPS.  SEE INFO BELOW REGARDING IBS.  Next colonoscopy in 10 years.   Colonoscopy Care After Read the instructions outlined below and refer to this sheet in the next week. These discharge instructions provide you with general information on caring for yourself after you leave the hospital. While your treatment has been planned according to the most current medical practices available, unavoidable complications occasionally occur. If you have any problems or questions after discharge, call DR. Samaia Iwata, [phone].  ACTIVITY  You may resume your regular activity, but move at a slower pace for the next 24 hours.   Take frequent rest periods for the next 24 hours.   Walking will help get rid of the air and reduce the bloated feeling in your belly (abdomen).   No driving for 24 hours (because of the medicine (anesthesia) used during the test).   You may shower.   Do not sign any important legal documents or operate any machinery for 24 hours (because of the anesthesia used during the test).    NUTRITION  Drink plenty of fluids.   You may resume your normal diet as instructed by your doctor.   Begin with a light meal and progress to your normal diet. Heavy or fried foods are harder to digest and may make you feel sick to your stomach (nauseated).   Avoid alcoholic beverages for 24 hours or as instructed.    MEDICATIONS  You may resume your normal medications.   WHAT YOU CAN EXPECT TODAY  Some feelings of bloating in the abdomen.   Passage of more gas than usual.   Spotting of blood in your stool or on the toilet paper  .  IF YOU HAD POLYPS REMOVED DURING THE COLONOSCOPY:  Eat a soft diet IF YOU HAVE NAUSEA, BLOATING, ABDOMINAL PAIN, OR VOMITING.    FINDING OUT  THE RESULTS OF YOUR TEST Not all test results are available during your visit. DR. Darrick Penna WILL CALL YOU WITHIN 7 DAYS OF YOUR PROCEDUE WITH YOUR RESULTS. Do not assume everything is normal if you have not heard from DR. Adreanna Fickel IN ONE WEEK, CALL HER OFFICE AT [phone].  SEEK IMMEDIATE MEDICAL ATTENTION AND CALL THE OFFICE: [phone] IF:  You have more than a spotting of blood in your stool.   Your belly is swollen (abdominal distention).   You are nauseated or vomiting.   You have a temperature over 101F.   You have abdominal pain or discomfort that is severe or gets worse throughout the day.  High-Fiber Diet A high-fiber diet changes your normal diet to include more whole grains, legumes, fruits, and vegetables. Changes in the diet involve replacing refined carbohydrates with unrefined foods. The calorie level of the diet is essentially unchanged. The Dietary Reference Intake (recommended amount) for adult males is 38 grams per day. For adult females, it is 25 grams per day. Pregnant and lactating women should consume 28 grams of fiber per day. Fiber is the intact part of a plant that is not broken down during digestion. Functional fiber is fiber that has been isolated from the plant to provide a beneficial effect in the body. PURPOSE  Increase stool bulk.   Ease and regulate bowel movements.   Lower cholesterol.  INDICATIONS THAT YOU NEED MORE FIBER  Constipation and  hemorrhoids.   Uncomplicated diverticulosis (intestine condition) and irritable bowel syndrome.   Weight management.   As a protective measure against hardening of the arteries (atherosclerosis), diabetes, and cancer.   GUIDELINES FOR INCREASING FIBER IN THE DIET  Start adding fiber to the diet slowly. A gradual increase of about 5 more grams (2 slices of whole-wheat bread, 2 servings of most fruits or vegetables, or 1 bowl of high-fiber cereal) per day is best. Too rapid an increase in fiber may result in  constipation, flatulence, and bloating.   Drink enough water and fluids to keep your urine clear or pale yellow. Water, juice, or caffeine-free drinks are recommended. Not drinking enough fluid may cause constipation.   Eat a variety of high-fiber foods rather than one type of fiber.   Try to increase your intake of fiber through using high-fiber foods rather than fiber pills or supplements that contain small amounts of fiber.   The goal is to change the types of food eaten. Do not supplement your present diet with high-fiber foods, but replace foods in your present diet.  INCLUDE A VARIETY OF FIBER SOURCES  Replace refined and processed grains with whole grains, canned fruits with fresh fruits, and incorporate other fiber sources. White rice, white breads, and most bakery goods contain little or no fiber.   Brown whole-grain rice, buckwheat oats, and many fruits and vegetables are all good sources of fiber. These include: broccoli, Brussels sprouts, cabbage, cauliflower, beets, sweet potatoes, white potatoes (skin on), carrots, tomatoes, eggplant, squash, berries, fresh fruits, and dried fruits.   Cereals appear to be the richest source of fiber. Cereal fiber is found in whole grains and bran. Bran is the fiber-rich outer coat of cereal grain, which is largely removed in refining. In whole-grain cereals, the bran remains. In breakfast cereals, the largest amount of fiber is found in those with "bran" in their names. The fiber content is sometimes indicated on the label.   You may need to include additional fruits and vegetables each day.   In baking, for 1 cup white flour, you may use the following substitutions:   1 cup whole-wheat flour minus 2 tablespoons.   1/2 cup white flour plus 1/2 cup whole-wheat flour.    Irritable Bowel Syndrome (Spastic Colon) Irritable Bowel Syndrome (IBS) is caused by a disturbance of normal bowel function. Other terms used are spastic colon, mucous  colitis, and irritable colon. It does not require surgery, nor does it lead to cancer. There is no cure for IBS. But with proper diet, stress reduction, and medication, you will find that your problems (symptoms) will gradually disappear or improve. IBS is a common digestive disorder. It usually appears in late adolescence or early adulthood. Women develop it twice as often as men.  CAUSES After food has been digested and absorbed in the small intestine, waste material is moved into the colon (large intestine). In the colon, water and salts are absorbed from the undigested products coming from the small intestine. The remaining residue, or fecal material, is held for elimination. Under normal circumstances, gentle, rhythmic contractions on the bowel walls push the fecal material along the colon towards the rectum. In IBS, however, these contractions are irregular and poorly coordinated. The fecal material is either retained too long, resulting in constipation, or expelled too soon, producing diarrhea.  SYMPTOMS  The most common symptom of IBS is pain. It is typically in the lower left side of the belly (abdomen). But it may occur anywhere  in the abdomen. It can be felt as heartburn, backache, or even as a dull pain in the arms or shoulders. The pain comes from excessive bowel-muscle spasms and from the buildup of gas and fecal material in the colon. This pain:  Can range from sharp belly (abdominal) cramps to a dull, continuous ache.   Usually worsens soon after eating.   Is typically relieved by having a bowel movement or passing gas.  Abdominal pain is usually accompanied by constipation. But it may also produce diarrhea. The diarrhea typically occurs right after a meal or upon arising in the morning. The stools are typically soft and watery. They are often flecked with secretions (mucus).  Other symptoms of IBS include:  Bloating.  Loss of appetite.   Heartburn.  Feeling sick to your stomach   (nausea).   Belching  Vomiting   Gas.  IBS may also cause a number of symptoms that are unrelated to the digestive system:  Fatigue.  Headaches.   Anxiety  Shortness of breath   Difficulty in concentrating.  Dizziness.   These symptoms tend to come and go.  TREATMENT A number of medications are available to help correct bowel function and/or relieve bowel spasms and abdominal pain. Among the drugs available are:  Mild, non-irritating laxatives for severe constipation and to help restore normal bowel habits.   Specific anti-diarrheal medications (BENTYL) to treat severe or prolonged diarrhea.   Anti-spasmodic agents to relieve intestinal cramps.   HOME CARE INSTRUCTIONS   Avoid foods that are high in fat or oils. Some examples ZOX:WRUEA cream, butter, frankfurters, sausage, and other fatty meats.   Avoid foods that have a laxative effect, such as fruit, fruit juice, and dairy products.   Cut out carbonated drinks, chewing gum, and "gassy" foods, such as beans and cabbage. This may help relieve bloating and belching.   Bran taken with plenty of liquids may help relieve constipation.   Keep track of what foods seem to trigger your symptoms.   Avoid emotionally charged situations or circumstances that produce anxiety.   Start or continue exercising.   Get plenty of rest and sleep.    Diverticulosis Diverticulosis is a common condition that develops when small pouches (diverticula) form in the wall of the colon. The risk of diverticulosis increases with age. It happens more often in people who eat a low-fiber diet. Most individuals with diverticulosis have no symptoms. Those individuals with symptoms usually experience belly (abdominal) pain, constipation, or loose stools (diarrhea).  HOME CARE INSTRUCTIONS  Increase the amount of fiber in your diet as directed by your caregiver or dietician. This may reduce symptoms of diverticulosis.   Drink at least 6 to 8 glasses  of water each day to prevent constipation.   Try not to strain when you have a bowel movement.   Avoiding nuts and seeds to prevent complications is still an uncertain benefit.       FOODS HAVING HIGH FIBER CONTENT INCLUDE:  Fruits. Apple, peach, pear, tangerine, raisins, prunes.   Vegetables. Brussels sprouts, asparagus, broccoli, cabbage, carrot, cauliflower, romaine lettuce, spinach, summer squash, tomato, winter squash, zucchini.   Starchy Vegetables. Baked beans, kidney beans, lima beans, split peas, lentils, potatoes (with skin).   Grains. Whole wheat bread, brown rice, bran flake cereal, plain oatmeal, white rice, shredded wheat, bran muffins.    SEEK IMMEDIATE MEDICAL CARE IF:  You develop increasing pain or severe bloating.   You have an oral temperature above 101F.   You develop  vomiting or bowel movements that are bloody or black.   Hemorrhoids Hemorrhoids are dilated (enlarged) veins around the rectum. Sometimes clots will form in the veins. This makes them swollen and painful. These are called thrombosed hemorrhoids. Causes of hemorrhoids include:  Constipation.   Straining to have a bowel movement.   HEAVY LIFTING HOME CARE INSTRUCTIONS  Eat a well balanced diet and drink 6 to 8 glasses of water every day to avoid constipation. You may also use a bulk laxative.   Avoid straining to have bowel movements.   Keep anal area dry and clean.   Do not use a donut shaped pillow or sit on the toilet for long periods. This increases blood pooling and pain.   Move your bowels when your body has the urge; this will require less straining and will decrease pain and pressure.

## 2011-07-14 NOTE — H&P (Signed)
Primary Care Physician:  Colette Ribas, MD, MD Primary Gastroenterologist:  Dr. Darrick Penna  Pre-Procedure History & Physical: HPI:  [name] is a 51 y.o. female here for COLON CANCER SCREENING.   Past Medical History  Diagnosis Date  . Hypertension   . Hypothyroidism   . IBS (irritable bowel syndrome)   . Depression     started Lexapro when husband passed away  . PONV (postoperative nausea and vomiting)     Past Surgical History  Procedure Date  . Foot surgery     x 5 right  . Hand surgery     x2 right  . Cesarean section     x 2  . Esophagogastroduodenoscopy Jan 2004    RMR: normal esophagus, small hiatal hernia, normal D1, D2  . Cholecystectomy 2009    [facility]    Prior to Admission medications   Medication Sig Start Date End Date Taking? Authorizing Provider  ALPRAZolam Prudy Feeler) 0.5 MG tablet Take 0.5 mg by mouth 4 (four) times daily as needed. For anxiety and sleep   Yes Historical Provider, MD  dicyclomine (BENTYL) 20 MG tablet Take 1 tablet (20 mg total) by mouth 3 (three) times daily as needed. For diarrhea and cramping 06/09/11 06/08/12 Yes Nira Retort, NP  diphenhydrAMINE (BENADRYL) 12.5 MG chewable tablet Chew 37.5 mg by mouth every 6 (six) hours as needed. For sinus irritation   Yes Historical Provider, MD  escitalopram (LEXAPRO) 20 MG tablet Take 20 mg by mouth every morning.    Yes Historical Provider, MD  HYDROcodone-acetaminophen (VICODIN) 5-500 MG per tablet Take 1 tablet by mouth every 4 (four) hours as needed. For pain   Yes Historical Provider, MD  levothyroxine (SYNTHROID, LEVOTHROID) 150 MCG tablet Take 150 mcg by mouth daily before breakfast.   Yes Historical Provider, MD  olmesartan-hydrochlorothiazide (BENICAR HCT) 20-12.5 MG per tablet Take 1 tablet by mouth every morning.   Yes Historical Provider, MD  promethazine (PHENERGAN) 25 MG tablet Take 25 mg by mouth every 6 (six) hours as needed. For nausea   Yes Historical Provider, MD    Allergies as of  06/09/2011 - Review Complete 06/09/2011  Allergen Reaction Noted  . Synthroid (levothyroxine sodium) Itching and Swelling 06/09/2011    Family History  Problem Relation Age of Onset  . Colon cancer Neg Hx     History   Social History  . Marital Status: Widowed    Spouse Name: N/A    Number of Children: N/A  . Years of Education: N/A   Occupational History  . Not on file.   Social History Main Topics  . Smoking status: Never Smoker   . Smokeless tobacco: Not on file  . Alcohol Use: No  . Drug Use: No  . Sexually Active: Not on file   Other Topics Concern  . Not on file   Social History Narrative   Husband passed away 8 years ago from [employer].     Review of Systems: See HPI, otherwise negative ROS   Physical Exam: BP 109/60  Pulse 114  Temp(Src) 97.8 F (36.6 C) (Oral)  Resp 20  Ht 5\' 10"  (1.778 m)  Wt 302 lb (136.986 kg)  BMI 43.33 kg/m2  SpO2 96%  LMP 07/10/2011 General:   Alert,  pleasant and cooperative in NAD Head:  Normocephalic and atraumatic. Neck:  Supple;  Lungs:  Clear throughout to auscultation.    Heart:  Regular rate and rhythm. Abdomen:  Soft, nontender and nondistended. Normal bowel  sounds, without guarding, and without rebound.   Neurologic:  Alert and  oriented x4;  grossly normal neurologically.  Impression/Plan:     SCREENING  Plan:  1. TCS TODAY

## 2011-07-14 NOTE — Op Note (Signed)
[facility] [address] [place], [place]  [address]  COLONOSCOPY PROCEDURE REPORT  PATIENT:  [name], [name]  MR#:  [id number] BIRTHDATE:  May 08, 1960, 51 yrs. old  GENDER:  female  ENDOSCOPIST:  Jonette Eva, MD REF. BY:  Assunta Found, M.D. ASSISTANT:  PROCEDURE DATE:  07/14/2011 PROCEDURE:  Colonoscopy 19147  INDICATIONS:  SCREENING  MEDICATIONS:   MAC sedation, administered by CRNA  DESCRIPTION OF PROCEDURE:    Physical exam was performed. Informed consent was obtained from the patient after explaining the benefits, risks, and alternatives to procedure.  The patient was connected to monitor and placed in left lateral position. Continuous oxygen was provided by nasal cannula and IV medicine administered through an indwelling cannula.  After administration of sedation and rectal exam, the patient's rectum was intubated and the  colonoscope was advanced under direct visualization to the cecum.  The scope was removed slowly by carefully examining the color, texture, anatomy, and integrity mucosa on the way out. The patient was recovered in endoscopy and discharged home in satisfactory condition. <<PROCEDUREIMAGES>>  FINDINGS:  FREQUENT Diverticula were found DESCENDING & SIGMOID COLON. SMALL Internal Hemorrhoids were found.  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  n. eSIGNED:   Aland Chestnutt at 07/14/2011 10:12 AM  Laureen Ochs, [phone]

## 2011-07-14 NOTE — Transfer of Care (Signed)
Immediate Anesthesia Transfer of Care Note  Patient: [name]  Procedure(s) Performed: Procedure(s) (LRB): COLONOSCOPY WITH PROPOFOL (N/A)  Patient Location: PACU  Anesthesia Type: MAC  Level of Consciousness: awake, alert  and oriented  Airway & Oxygen Therapy: Patient Spontanous Breathing  Post-op Assessment: Report given to PACU RN  Post vital signs: Reviewed and stable  Complications: No apparent anesthesia complications

## 2011-07-14 NOTE — Anesthesia Postprocedure Evaluation (Signed)
  Anesthesia Post-op Note  Patient: [name]  Procedure(s) Performed: Procedure(s) (LRB): COLONOSCOPY WITH PROPOFOL (N/A)  Patient Location: PACU  Anesthesia Type: MAC  Level of Consciousness: awake, alert  and oriented  Airway and Oxygen Therapy: Patient Spontanous Breathing  Post-op Pain: none  Post-op Assessment: Post-op Vital signs reviewed, Patient's Cardiovascular Status Stable, Respiratory Function Stable, Patent Airway and No signs of Nausea or vomiting  Post-op Vital Signs: Reviewed and stable  Complications: No apparent anesthesia complications

## 2011-07-31 ENCOUNTER — Encounter (HOSPITAL_COMMUNITY): Payer: Self-pay | Admitting: Pharmacy Technician

## 2011-08-03 NOTE — Patient Instructions (Addendum)
20 [name]  08/03/2011   Your procedure is scheduled on:  08/11/11  Report to [facility] at 08:40 AM.  Call this number if you have problems the morning of surgery: [phone]:   Do not eat or drink:After Midnight.   Take these medicines the morning of surgery with A SIP OF WATER: Lexapro, Levothyroxine and Benicar.   Do not wear jewelry, make-up or nail polish.  Do not wear lotions, powders, or perfumes. You may wear deodorant.  Do not shave 48 hours prior to surgery. Men may shave face and neck.  Do not bring valuables to the hospital.  Contacts, dentures or bridgework may not be worn into surgery.   Patients discharged the day of surgery will not be allowed to drive home.   Special Instructions: CHG Shower Use Special Wash: 1/2 bottle night before surgery and 1/2 bottle morning of surgery.   Please read over the following fact sheets that you were given: Pain Booklet, Coughing and Deep Breathing, MRSA Information, Surgical Site Infection Prevention, Anesthesia Post-op Instructions and Care and Recovery After Surgery    Hysterectomy Information  A hysterectomy is a procedure where your uterus is surgically removed. It will no longer be possible to have menstrual periods or to become pregnant. The tubes and ovaries can be removed (bilateral salpingo-oopherectomy) during this surgery as well.  REASONS FOR A HYSTERECTOMY  Persistent, abnormal bleeding.   Lasting (chronic) pelvic pain or infection.   The lining of the uterus (endometrium) starts growing outside the uterus (endometriosis).   The endometrium starts growing in the muscle of the uterus (adenomyosis).   The uterus falls down into the vagina (pelvic organ prolapse).   Symptomatic uterine fibroids.   Precancerous cells.   Cervical cancer or uterine cancer.  TYPES OF HYSTERECTOMIES  Supracervical hysterectomy. This type removes the top part of the uterus, but not the cervix.   Total hysterectomy. This type  removes the uterus and cervix.   Radical hysterectomy. This type removes the uterus, cervix, and the fibrous tissue that holds the uterus in place in the pelvis (parametrium).  WAYS A HYSTERECTOMY CAN BE PERFORMED  Abdominal hysterectomy. A large surgical cut (incision) is made in the abdomen. The uterus is removed through this incision.   Vaginal hysterectomy. An incision is made in the vagina. The uterus is removed through this incision. There are no abdominal incisions.   Conventional laparoscopic hysterectomy. A thin, lighted tube with a camera (laparoscope) is inserted into 3 or 4 small incisions in the abdomen. The uterus is cut into small pieces. The small pieces are removed through the incisions, or they are removed through the vagina.   Laparoscopic assisted vaginal hysterectomy (LAVH). Three or four small incisions are made in the abdomen. Part of the surgery is performed laparoscopically and part vaginally. The uterus is removed through the vagina.   Robot-assisted laparoscopic hysterectomy. A laparoscope is inserted into 3 or 4 small incisions in the abdomen. A computer-controlled device is used to give the surgeon a 3D image. This allows for more precise movements of surgical instruments. The uterus is cut into small pieces and removed through the incisions or removed through the vagina.  RISKS OF HYSTERECTOMY   Bleeding and risk of blood transfusion. Tell your caregiver if you do not want to receive any blood products.   Blood clots in the legs or lung.   Infection.   Injury to surrounding organs.   Anesthesia problems or side effects.   Conversion to  an abdominal hysterectomy.  WHAT TO EXPECT AFTER A HYSTERECTOMY  You will be given pain medicine.   You will need to have someone with you for the first 3 to 5 days after you go home.   You will need to follow up with your surgeon in 2 to 4 weeks after surgery to evaluate your progress.   You may have early menopause  symptoms like hot flashes, night sweats, and insomnia.   If you had a hysterectomy for a problem that was not a cancer or a condition that could lead to cancer, then you no longer need Pap tests. However, even if you no longer need a Pap test, a regular exam is a good idea to make sure no other problems are starting.  Document Released: 07/22/2000 Document Revised: 01/15/2011 Document Reviewed: 09/06/2010 [facility] Patient Information 2012 [place], [place].   PATIENT INSTRUCTIONS POST-ANESTHESIA  IMMEDIATELY FOLLOWING SURGERY:  Do not drive or operate machinery for the first twenty four hours after surgery.  Do not make any important decisions for twenty four hours after surgery or while taking narcotic pain medications or sedatives.  If you develop intractable nausea and vomiting or a severe headache please notify your doctor immediately.  FOLLOW-UP:  Please make an appointment with your surgeon as instructed. You do not need to follow up with anesthesia unless specifically instructed to do so.  WOUND CARE INSTRUCTIONS (if applicable):  Keep a dry clean dressing on the anesthesia/puncture wound site if there is drainage.  Once the wound has quit draining you may leave it open to air.  Generally you should leave the bandage intact for twenty four hours unless there is drainage.  If the epidural site drains for more than 36-48 hours please call the anesthesia department.  QUESTIONS?:  Please feel free to call your physician or the hospital operator if you have any questions, and they will be happy to assist you.

## 2011-08-04 ENCOUNTER — Encounter (HOSPITAL_COMMUNITY): Payer: Self-pay

## 2011-08-04 ENCOUNTER — Other Ambulatory Visit: Payer: Self-pay | Admitting: Obstetrics and Gynecology

## 2011-08-04 ENCOUNTER — Encounter (HOSPITAL_COMMUNITY)
Admission: RE | Admit: 2011-08-04 | Discharge: 2011-08-04 | Disposition: A | Discharge status: Home / Self Care | Source: Ambulatory Visit | Attending: Obstetrics and Gynecology | Admitting: Obstetrics and Gynecology

## 2011-08-04 LAB — TYPE AND SCREEN: ABO/RH(D): A POS

## 2011-08-04 LAB — BASIC METABOLIC PANEL
BUN: 9 mg/dL (ref 6–23)
Chloride: 101 mEq/L (ref 96–112)
GFR calc Af Amer: 60 mL/min — ABNORMAL LOW (ref >90)
Glucose, Bld: 99 mg/dL (ref 70–99)
Potassium: 4.2 mEq/L (ref 3.5–5.1)
Sodium: 141 mEq/L (ref 135–145)

## 2011-08-04 LAB — CBC
HCT: 44.8 % (ref 36.0–46.0)
Hemoglobin: 14.3 g/dL (ref 12.0–15.0)
MCHC: 31.9 g/dL (ref 30.0–36.0)
RBC: 5.11 MIL/uL (ref 3.87–5.11)

## 2011-08-04 MED ORDER — MAGNESIUM CITRATE PO SOLN: 300.0000 mL | Freq: Once | ORAL | Status: DC

## 2011-08-11 ENCOUNTER — Encounter (HOSPITAL_COMMUNITY): Payer: Self-pay | Admitting: Anesthesiology

## 2011-08-11 ENCOUNTER — Encounter (HOSPITAL_COMMUNITY): Payer: Self-pay | Admitting: *Deleted

## 2011-08-11 ENCOUNTER — Encounter (HOSPITAL_COMMUNITY)
Admission: RE | Disposition: A | Discharge status: Home / Self Care | Payer: Self-pay | Source: Ambulatory Visit | Attending: Obstetrics and Gynecology

## 2011-08-11 ENCOUNTER — Inpatient Hospital Stay (HOSPITAL_COMMUNITY)
Admission: RE | Admit: 2011-08-11 | Discharge: 2011-08-16 | DRG: 742 | Disposition: A | Discharge status: Home / Self Care | Source: Ambulatory Visit | Attending: Obstetrics and Gynecology | Admitting: Obstetrics and Gynecology

## 2011-08-11 ENCOUNTER — Inpatient Hospital Stay (HOSPITAL_COMMUNITY): Admitting: Anesthesiology

## 2011-08-11 DIAGNOSIS — F329 Major depressive disorder, single episode, unspecified: Secondary | ICD-10-CM | POA: Diagnosis present

## 2011-08-11 DIAGNOSIS — IMO0002 Reserved for concepts with insufficient information to code with codable children: Secondary | ICD-10-CM | POA: Diagnosis not present

## 2011-08-11 DIAGNOSIS — F3289 Other specified depressive episodes: Secondary | ICD-10-CM | POA: Diagnosis present

## 2011-08-11 DIAGNOSIS — Z6841 Body Mass Index (BMI) 40.0 and over, adult: Secondary | ICD-10-CM

## 2011-08-11 DIAGNOSIS — E039 Hypothyroidism, unspecified: Secondary | ICD-10-CM | POA: Diagnosis present

## 2011-08-11 DIAGNOSIS — D649 Anemia, unspecified: Secondary | ICD-10-CM | POA: Diagnosis not present

## 2011-08-11 DIAGNOSIS — I10 Essential (primary) hypertension: Secondary | ICD-10-CM | POA: Diagnosis present

## 2011-08-11 DIAGNOSIS — Y836 Removal of other organ (partial) (total) as the cause of abnormal reaction of the patient, or of later complication, without mention of misadventure at the time of the procedure: Secondary | ICD-10-CM | POA: Diagnosis not present

## 2011-08-11 DIAGNOSIS — K589 Irritable bowel syndrome without diarrhea: Secondary | ICD-10-CM | POA: Diagnosis present

## 2011-08-11 DIAGNOSIS — N8502 Endometrial intraepithelial neoplasia [EIN]: Secondary | ICD-10-CM

## 2011-08-11 DIAGNOSIS — I959 Hypotension, unspecified: Secondary | ICD-10-CM | POA: Diagnosis not present

## 2011-08-11 HISTORY — PX: SCAR REVISION: SHX5285

## 2011-08-11 HISTORY — PX: ABDOMINAL HYSTERECTOMY: SHX81

## 2011-08-11 HISTORY — PX: SALPINGOOPHORECTOMY: SHX82

## 2011-08-11 LAB — URINALYSIS, ROUTINE W REFLEX MICROSCOPIC
Ketones, ur: NEGATIVE mg/dL
Nitrite: NEGATIVE
Specific Gravity, Urine: 1.01 (ref 1.005–1.030)
pH: 5.5 (ref 5.0–8.0)

## 2011-08-11 SURGERY — HYSTERECTOMY, ABDOMINAL
Anesthesia: General | Site: Abdomen | Wound class: Contaminated

## 2011-08-11 MED ORDER — FENTANYL CITRATE 0.05 MG/ML IJ SOLN
INTRAMUSCULAR | Status: AC
  Administered 2011-08-11: 50 ug via INTRAVENOUS
  Filled 2011-08-11: qty 2

## 2011-08-11 MED ORDER — PROPOFOL 10 MG/ML IV EMUL
INTRAVENOUS | Status: DC | PRN
  Administered 2011-08-11: 150 mg via INTRAVENOUS
  Administered 2011-08-11: 20 mg via INTRAVENOUS

## 2011-08-11 MED ORDER — KETOROLAC TROMETHAMINE 30 MG/ML IJ SOLN
30.0000 mg | Freq: Four times a day (QID) | INTRAMUSCULAR | Status: AC
  Filled 2011-08-11 (×3): qty 1

## 2011-08-11 MED ORDER — KETOROLAC TROMETHAMINE 30 MG/ML IJ SOLN
30.0000 mg | Freq: Four times a day (QID) | INTRAMUSCULAR | Status: AC
  Administered 2011-08-11 – 2011-08-13 (×5): 30 mg via INTRAVENOUS
  Filled 2011-08-11 (×2): qty 1

## 2011-08-11 MED ORDER — NALOXONE HCL 0.4 MG/ML IJ SOLN: 0.4000 mg | INTRAMUSCULAR | Status: DC | PRN

## 2011-08-11 MED ORDER — HYDROMORPHONE 0.3 MG/ML IV SOLN
INTRAVENOUS | Status: DC
  Administered 2011-08-11: 2.1 mg via INTRAVENOUS
  Administered 2011-08-11 – 2011-08-12 (×2): 0.3 mg via INTRAVENOUS
  Administered 2011-08-12: 0.6 mg via INTRAVENOUS
  Administered 2011-08-12: 0.3 mg via INTRAVENOUS
  Administered 2011-08-12: 1.2 mg via INTRAVENOUS
  Administered 2011-08-12: 0.6 mg via INTRAVENOUS

## 2011-08-11 MED ORDER — LACTATED RINGERS IV SOLN
INTRAVENOUS | Status: DC
  Administered 2011-08-11: 12:00:00 via INTRAVENOUS
  Administered 2011-08-11: 1000 mL via INTRAVENOUS

## 2011-08-11 MED ORDER — MIDAZOLAM HCL 2 MG/2ML IJ SOLN
1.0000 mg | INTRAMUSCULAR | Status: DC | PRN
  Administered 2011-08-11: 2 mg via INTRAVENOUS

## 2011-08-11 MED ORDER — PANTOPRAZOLE SODIUM 40 MG IV SOLR
40.0000 mg | Freq: Every day | INTRAVENOUS | Status: DC
  Administered 2011-08-11 – 2011-08-13 (×3): 40 mg via INTRAVENOUS
  Filled 2011-08-11 (×3): qty 40

## 2011-08-11 MED ORDER — ONDANSETRON HCL 4 MG/2ML IJ SOLN
4.0000 mg | Freq: Four times a day (QID) | INTRAMUSCULAR | Status: DC | PRN
  Administered 2011-08-14: 4 mg via INTRAVENOUS

## 2011-08-11 MED ORDER — FENTANYL CITRATE 0.05 MG/ML IJ SOLN
25.0000 ug | INTRAMUSCULAR | Status: DC | PRN
  Administered 2011-08-11 (×3): 50 ug via INTRAVENOUS

## 2011-08-11 MED ORDER — 0.9 % SODIUM CHLORIDE (POUR BTL) OPTIME
TOPICAL | Status: DC | PRN
  Administered 2011-08-11: 2000 mL

## 2011-08-11 MED ORDER — GLYCOPYRROLATE 0.2 MG/ML IJ SOLN
INTRAMUSCULAR | Status: DC | PRN
  Administered 2011-08-11: 0.4 mg via INTRAVENOUS
  Administered 2011-08-11: 0.2 mg via INTRAVENOUS

## 2011-08-11 MED ORDER — ROCURONIUM BROMIDE 100 MG/10ML IV SOLN
INTRAVENOUS | Status: DC | PRN
  Administered 2011-08-11 (×2): 10 mg via INTRAVENOUS
  Administered 2011-08-11: 50 mg via INTRAVENOUS
  Administered 2011-08-11 (×2): 10 mg via INTRAVENOUS

## 2011-08-11 MED ORDER — ROCURONIUM BROMIDE 50 MG/5ML IV SOLN
INTRAVENOUS | Status: AC
  Filled 2011-08-11: qty 1

## 2011-08-11 MED ORDER — ONDANSETRON HCL 4 MG/2ML IJ SOLN: 4.0000 mg | Freq: Once | INTRAMUSCULAR | Status: DC | PRN

## 2011-08-11 MED ORDER — NEOSTIGMINE METHYLSULFATE 1 MG/ML IJ SOLN
INTRAMUSCULAR | Status: DC | PRN
  Administered 2011-08-11: 2 mg via INTRAVENOUS

## 2011-08-11 MED ORDER — DOCUSATE SODIUM 100 MG PO CAPS
100.0000 mg | ORAL_CAPSULE | Freq: Two times a day (BID) | ORAL | Status: DC
  Administered 2011-08-12 – 2011-08-14 (×5): 100 mg via ORAL
  Filled 2011-08-11 (×5): qty 1

## 2011-08-11 MED ORDER — IRBESARTAN 150 MG PO TABS
150.0000 mg | ORAL_TABLET | Freq: Every day | ORAL | Status: DC
  Administered 2011-08-12: 150 mg via ORAL
  Filled 2011-08-11 (×2): qty 1

## 2011-08-11 MED ORDER — OLMESARTAN MEDOXOMIL-HCTZ 20-12.5 MG PO TABS: 1.0000 | ORAL_TABLET | Freq: Every morning | ORAL | Status: DC

## 2011-08-11 MED ORDER — ZOLPIDEM TARTRATE 5 MG PO TABS: 5.0000 mg | ORAL_TABLET | Freq: Every evening | ORAL | Status: DC | PRN

## 2011-08-11 MED ORDER — FENTANYL CITRATE 0.05 MG/ML IJ SOLN
INTRAMUSCULAR | Status: AC
  Filled 2011-08-11: qty 2

## 2011-08-11 MED ORDER — SCOPOLAMINE 1 MG/3DAYS TD PT72
1.0000 | MEDICATED_PATCH | Freq: Once | TRANSDERMAL | Status: DC
  Administered 2011-08-11: 1.5 mg via TRANSDERMAL

## 2011-08-11 MED ORDER — KCL IN DEXTROSE-NACL 20-5-0.45 MEQ/L-%-% IV SOLN
INTRAVENOUS | Status: DC
  Administered 2011-08-11: 125 mL/h via INTRAVENOUS
  Administered 2011-08-11: 1000 mL via INTRAVENOUS
  Administered 2011-08-12: 08:00:00 via INTRAVENOUS

## 2011-08-11 MED ORDER — SODIUM CHLORIDE 0.9 % IJ SOLN: 9.0000 mL | INTRAMUSCULAR | Status: DC | PRN

## 2011-08-11 MED ORDER — ONDANSETRON HCL 4 MG/2ML IJ SOLN: 4.0000 mg | Freq: Four times a day (QID) | INTRAMUSCULAR | Status: DC | PRN

## 2011-08-11 MED ORDER — HYDROMORPHONE 0.3 MG/ML IV SOLN
INTRAVENOUS | Status: AC
  Filled 2011-08-11: qty 25

## 2011-08-11 MED ORDER — GLYCOPYRROLATE 0.2 MG/ML IJ SOLN
INTRAMUSCULAR | Status: AC
  Filled 2011-08-11: qty 1

## 2011-08-11 MED ORDER — LIDOCAINE HCL (CARDIAC) 10 MG/ML IV SOLN
INTRAVENOUS | Status: DC | PRN
  Administered 2011-08-11: 10 mg via INTRAVENOUS

## 2011-08-11 MED ORDER — PROPOFOL 10 MG/ML IV EMUL
INTRAVENOUS | Status: AC
  Filled 2011-08-11: qty 20

## 2011-08-11 MED ORDER — HYDROCHLOROTHIAZIDE 12.5 MG PO CAPS
12.5000 mg | ORAL_CAPSULE | Freq: Every day | ORAL | Status: DC
  Administered 2011-08-12: 12.5 mg via ORAL
  Filled 2011-08-11 (×2): qty 1

## 2011-08-11 MED ORDER — ONDANSETRON HCL 4 MG PO TABS: 4.0000 mg | ORAL_TABLET | Freq: Four times a day (QID) | ORAL | Status: DC | PRN

## 2011-08-11 MED ORDER — ALPRAZOLAM 0.5 MG PO TABS: 0.5000 mg | ORAL_TABLET | Freq: Four times a day (QID) | ORAL | Status: DC | PRN

## 2011-08-11 MED ORDER — LEVOTHYROXINE SODIUM 75 MCG PO TABS
150.0000 ug | ORAL_TABLET | Freq: Every day | ORAL | Status: DC
  Administered 2011-08-12 – 2011-08-14 (×3): 150 ug via ORAL
  Filled 2011-08-11 (×3): qty 2

## 2011-08-11 MED ORDER — DEXAMETHASONE SODIUM PHOSPHATE 4 MG/ML IJ SOLN
4.0000 mg | Freq: Once | INTRAMUSCULAR | Status: AC
  Administered 2011-08-11: 4 mg via INTRAVENOUS

## 2011-08-11 MED ORDER — KETOROLAC TROMETHAMINE 30 MG/ML IJ SOLN: 30.0000 mg | Freq: Once | INTRAMUSCULAR | Status: DC

## 2011-08-11 MED ORDER — NEOSTIGMINE METHYLSULFATE 1 MG/ML IJ SOLN
INTRAMUSCULAR | Status: AC
  Filled 2011-08-11: qty 10

## 2011-08-11 MED ORDER — OXYCODONE-ACETAMINOPHEN 5-325 MG PO TABS
1.0000 | ORAL_TABLET | ORAL | Status: DC | PRN
  Administered 2011-08-13: 1 via ORAL
  Administered 2011-08-13: 2 via ORAL
  Administered 2011-08-13: 1 via ORAL
  Administered 2011-08-13 – 2011-08-14 (×3): 2 via ORAL
  Filled 2011-08-11 (×2): qty 2
  Filled 2011-08-11: qty 1
  Filled 2011-08-11: qty 2
  Filled 2011-08-11: qty 1
  Filled 2011-08-11: qty 2

## 2011-08-11 MED ORDER — DIPHENHYDRAMINE HCL 12.5 MG/5ML PO ELIX: 12.5000 mg | ORAL_SOLUTION | Freq: Four times a day (QID) | ORAL | Status: DC | PRN

## 2011-08-11 MED ORDER — ESCITALOPRAM OXALATE 10 MG PO TABS
20.0000 mg | ORAL_TABLET | Freq: Every morning | ORAL | Status: DC
  Administered 2011-08-12 – 2011-08-14 (×3): 20 mg via ORAL
  Filled 2011-08-11: qty 2
  Filled 2011-08-11 (×2): qty 1
  Filled 2011-08-11: qty 2

## 2011-08-11 MED ORDER — MIDAZOLAM HCL 2 MG/2ML IJ SOLN
INTRAMUSCULAR | Status: AC
  Administered 2011-08-11: 2 mg via INTRAVENOUS
  Filled 2011-08-11: qty 2

## 2011-08-11 MED ORDER — FENTANYL CITRATE 0.05 MG/ML IJ SOLN
INTRAMUSCULAR | Status: DC | PRN
  Administered 2011-08-11 (×4): 50 ug via INTRAVENOUS

## 2011-08-11 MED ORDER — ONDANSETRON HCL 4 MG/2ML IJ SOLN
INTRAMUSCULAR | Status: AC
  Administered 2011-08-11: 4 mg via INTRAVENOUS
  Filled 2011-08-11: qty 2

## 2011-08-11 MED ORDER — SCOPOLAMINE 1 MG/3DAYS TD PT72
MEDICATED_PATCH | TRANSDERMAL | Status: AC
  Administered 2011-08-11: 1.5 mg via TRANSDERMAL
  Filled 2011-08-11: qty 1

## 2011-08-11 MED ORDER — CEFAZOLIN SODIUM-DEXTROSE 2-3 GM-% IV SOLR
2.0000 g | INTRAVENOUS | Status: AC
  Administered 2011-08-11: 2 g via INTRAVENOUS

## 2011-08-11 MED ORDER — ONDANSETRON HCL 4 MG/2ML IJ SOLN
4.0000 mg | Freq: Once | INTRAMUSCULAR | Status: AC
  Administered 2011-08-11: 4 mg via INTRAVENOUS

## 2011-08-11 MED ORDER — SODIUM CHLORIDE 0.9 % IJ SOLN
INTRAMUSCULAR | Status: AC
  Administered 2011-08-11: 21:00:00
  Filled 2011-08-11: qty 3

## 2011-08-11 MED ORDER — DIPHENHYDRAMINE HCL 50 MG/ML IJ SOLN: 12.5000 mg | Freq: Four times a day (QID) | INTRAMUSCULAR | Status: DC | PRN

## 2011-08-11 MED ORDER — CEFAZOLIN SODIUM-DEXTROSE 2-3 GM-% IV SOLR
INTRAVENOUS | Status: AC
  Filled 2011-08-11: qty 50

## 2011-08-11 MED ORDER — DEXAMETHASONE SODIUM PHOSPHATE 4 MG/ML IJ SOLN
INTRAMUSCULAR | Status: AC
  Administered 2011-08-11: 4 mg via INTRAVENOUS
  Filled 2011-08-11: qty 1

## 2011-08-11 SURGICAL SUPPLY — 68 items
APL SKNCLS STERI-STRIP NONHPOA (GAUZE/BANDAGES/DRESSINGS) ×2
APPLIER CLIP 11 MED OPEN (CLIP)
APPLIER CLIP 13 LRG OPEN (CLIP)
APR CLP LRG 13 20 CLIP (CLIP)
APR CLP MED 11 20 MLT OPN (CLIP)
BAG HAMPER (MISCELLANEOUS) ×3 IMPLANT
BENZOIN TINCTURE PRP APPL 2/3 (GAUZE/BANDAGES/DRESSINGS) ×1 IMPLANT
BRR ADH 6X5 SEPRAFILM 1 SHT (MISCELLANEOUS)
CELLS DAT CNTRL 66122 CELL SVR (MISCELLANEOUS) IMPLANT
CLIP APPLIE 11 MED OPEN (CLIP) IMPLANT
CLIP APPLIE 13 LRG OPEN (CLIP) IMPLANT
CLOTH BEACON ORANGE TIMEOUT ST (SAFETY) ×3 IMPLANT
COVER LIGHT HANDLE STERIS (MISCELLANEOUS) ×6 IMPLANT
DRAPE WARM FLUID 44X44 (DRAPE) ×3 IMPLANT
DRESSING TELFA 8X3 (GAUZE/BANDAGES/DRESSINGS) ×4 IMPLANT
ELECT REM PT RETURN 9FT ADLT (ELECTROSURGICAL) ×3
ELECTRODE REM PT RTRN 9FT ADLT (ELECTROSURGICAL) ×2 IMPLANT
EVACUATOR DRAINAGE 10X20 100CC (DRAIN) IMPLANT
EVACUATOR SILICONE 100CC (DRAIN) ×3
FORMALIN 10 PREFIL 480ML (MISCELLANEOUS) ×2 IMPLANT
GLOVE BIOGEL PI IND STRL 6.5 (GLOVE) IMPLANT
GLOVE BIOGEL PI IND STRL 7.0 (GLOVE) IMPLANT
GLOVE BIOGEL PI INDICATOR 6.5 (GLOVE) ×1
GLOVE BIOGEL PI INDICATOR 7.0 (GLOVE) ×2
GLOVE ECLIPSE 9.0 STRL (GLOVE) ×3 IMPLANT
GLOVE INDICATOR STER SZ 9 (GLOVE) ×3 IMPLANT
GLOVE SKINSENSE 9.0 STRL ORNG (GLOVE) ×1 IMPLANT
GLOVE SKINSENSE NS SZ6.5 (GLOVE) ×3
GLOVE SKINSENSE NS SZ7.0 (GLOVE) ×1
GLOVE SKINSENSE STRL SZ6.5 (GLOVE) IMPLANT
GLOVE SKINSENSE STRL SZ7.0 (GLOVE) IMPLANT
GOWN STRL REIN 3XL LVL4 (GOWN DISPOSABLE) ×3 IMPLANT
GOWN STRL REIN XL XLG (GOWN DISPOSABLE) ×7 IMPLANT
INST SET MAJOR GENERAL (KITS) ×3 IMPLANT
KIT ROOM TURNOVER APOR (KITS) ×3 IMPLANT
MANIFOLD NEPTUNE II (INSTRUMENTS) ×3 IMPLANT
NS IRRIG 1000ML POUR BTL (IV SOLUTION) ×6 IMPLANT
PACK ABDOMINAL MAJOR (CUSTOM PROCEDURE TRAY) ×3 IMPLANT
PAD ABD 5X9 TENDERSORB (GAUZE/BANDAGES/DRESSINGS) ×2 IMPLANT
PAD ARMBOARD 7.5X6 YLW CONV (MISCELLANEOUS) ×3 IMPLANT
RETRACTOR WND ALEXIS 18 MED (MISCELLANEOUS) IMPLANT
RETRACTOR WND ALEXIS 25 LRG (MISCELLANEOUS) IMPLANT
RTRCTR WOUND ALEXIS 18CM MED (MISCELLANEOUS)
RTRCTR WOUND ALEXIS 25CM LRG (MISCELLANEOUS)
SEPRAFILM MEMBRANE 5X6 (MISCELLANEOUS) IMPLANT
SET BASIN LINEN APH (SET/KITS/TRAYS/PACK) ×3 IMPLANT
SOL PREP PROV IODINE SCRUB 4OZ (MISCELLANEOUS) ×3 IMPLANT
SPONGE DRAIN TRACH 4X4 STRL 2S (GAUZE/BANDAGES/DRESSINGS) ×1 IMPLANT
SPONGE GAUZE 4X4 12PLY (GAUZE/BANDAGES/DRESSINGS) ×3 IMPLANT
SPONGE LAP 18X18 X RAY DECT (DISPOSABLE) IMPLANT
STRIP CLOSURE SKIN 1/2X4 (GAUZE/BANDAGES/DRESSINGS) ×5 IMPLANT
SUT CHROMIC 0 CT 1 (SUTURE) ×30 IMPLANT
SUT CHROMIC 2 0 CT 1 (SUTURE) ×3 IMPLANT
SUT CHROMIC GUT BROWN 0 54 (SUTURE) IMPLANT
SUT CHROMIC GUT BROWN 0 54IN (SUTURE)
SUT ETHILON 3 0 FSL (SUTURE) IMPLANT
SUT PDS AB CT VIOLET #0 27IN (SUTURE) IMPLANT
SUT PLAIN CT 1/2CIR 2-0 27IN (SUTURE) ×7 IMPLANT
SUT PROLENE 0 CT 1 30 (SUTURE) ×6 IMPLANT
SUT VIC AB 0 CT1 27 (SUTURE)
SUT VIC AB 0 CT1 27XBRD ANTBC (SUTURE) IMPLANT
SUT VIC AB 2-0 CT1 27 (SUTURE)
SUT VIC AB 2-0 CT1 TAPERPNT 27 (SUTURE) IMPLANT
SUT VICRYL 4 0 KS 27 (SUTURE) ×5 IMPLANT
TOWEL BLUE STERILE X RAY DET (MISCELLANEOUS) ×3 IMPLANT
TOWEL OR 17X26 4PK STRL BLUE (TOWEL DISPOSABLE) ×3 IMPLANT
TRAY FOLEY BAG SILVER LF 14FR (CATHETERS) ×1 IMPLANT
TRAY FOLEY CATH 14FR (SET/KITS/TRAYS/PACK) ×2 IMPLANT

## 2011-08-11 NOTE — H&P (Signed)
[name] is an 51 y.o. female. She is admitted for total abdominal hysterectomy bilateral salpingo-oophorectomy with removal of cervix for atypical endometrial hyperplasia. She experienced postmenopausal bleeding in May, had ultrasound showing endometrial thickening with endometrial biopsy being reported as " at least complex atypical hyperplasia. "Hysterectomy with removal of tubes and ovaries is planned.  Pertinent Gynecological History: Menses: post-menopausal Bleeding: post menopausal bleeding Contraception: post menopausal status DES exposure: unknown Blood transfusions: none Sexually transmitted diseases: no past history Previous GYN Procedures: Endometrial biopsy  Last mammogram:  Date:  Last pap: normal Date: 06/11/2011 OB History: G2, P2   Menstrual History: Menarche age: 80 No LMP recorded.    Past Medical History  Diagnosis Date  . Hypertension   . Hypothyroidism   . IBS (irritable bowel syndrome)   . Depression     started Lexapro when husband passed away  . PONV (postoperative nausea and vomiting)     Past Surgical History  Procedure Date  . Foot surgery     x 5 right  . Hand surgery     x2 right  . Cesarean section     x 2  . Esophagogastroduodenoscopy Jan 2004    RMR: normal esophagus, small hiatal hernia, normal D1, D2  . Cholecystectomy 2009    [facility]    Family History  Problem Relation Age of Onset  . Colon cancer Neg Hx     Social History:  reports that she has never smoked. She does not have any smokeless tobacco history on file. She reports that she does not drink alcohol or use illicit drugs.  Allergies:  Allergies  Allergen Reactions  . Bee Pollen Anaphylaxis  . Chocolate Other (See Comments)    Reaction: causes facial blistering,inside mouth  . Nsaids Other (See Comments)    vomiting  . Synthroid (Levothyroxine Sodium) Itching and Swelling    Patient has a reaction to brand name synthroid only  . Latex Rash  . Neosporin  (Neomycin-Bacitracin Zn-Polymyx) Rash    No prescriptions prior to admission   pharmacy [facility]   ROS nonsmoker, nondrinker denies recreational drugs  There were no vitals taken for this visit. Physical Exam Physical Examination: General appearance - alert, well appearing, and in no distress and overweight Mental status - alert, oriented to person, place, and time, normal mood, behavior, speech, dress, motor activity, and thought processes Neck - supple, no significant adenopathy Chest - clear to auscultation, no wheezes, rales or rhonchi, symmetric air entry Heart - normal rate and regular rhythm Abdomen - soft, nontender, nondistended, no masses or organomegaly Obese without masses. Well-healed Pfannenstiel incision in the skin crease. Will excise this old scar with wide move of adjacent fatty tissues for improved pelvic access and surgical healing Pelvic - VULVA: normal appearing vulva with no masses, tenderness or lesions, perforated for a patient age , VAGINA: normal appearing vagina with normal color and discharge, no lesions, atrophic, CERVIX: normal appearing cervix without discharge or lesions, UTERUS: uterus is normal size, shape, consistency and nontender, retroverted, ADNEXA: normal adnexa in size, nontender and no masses Extremities - peripheral pulses normal, no pedal edema, no clubbing or cyanosis  No results found for this or any previous visit (from the past 24 hour(s)). CBC    Component Value Date/Time   WBC 5.1 08/04/2011 1000   RBC 5.11 08/04/2011 1000   HGB 14.3 08/04/2011 1000   HCT 44.8 08/04/2011 1000   PLT 226 08/04/2011 1000  MCV 87.7 08/04/2011 1000   MCH 28.0 08/04/2011 1000   MCHC 31.9 08/04/2011 1000   RDW 14.3 08/04/2011 1000   LYMPHSABS 0.7 09/18/2007 1530   MONOABS 0.2 09/18/2007 1530   EOSABS 0.0 09/18/2007 1530   BASOSABS 0.0 09/18/2007 1530   BMET    Component Value Date/Time   NA 141 08/04/2011 1000    K 4.2 08/04/2011 1000   CL 101 08/04/2011 1000   CO2 31 08/04/2011 1000   GLUCOSE 99 08/04/2011 1000   BUN 9 08/04/2011 1000   CREATININE 1.18* 08/04/2011 1000   CALCIUM 10.4 08/04/2011 1000   GFRNONAA 52* 08/04/2011 1000   GFRAA 60* 08/04/2011 1000     No results found.  Assessment/Plan: Complex endometrial hyperplasia, atypical Plan: Total bowel hysterectomy bilateral salpingo-oophorectomy 08/11/2011           Wide excision of old cicatrix (partial panniculectomy)   Jorje Vanatta V 08/11/2011, 7:09 AM

## 2011-08-11 NOTE — Op Note (Signed)
Procedure: total abdominal hysterectomy bilateral salpingo-oophorectomy Patient was taken operating room prepped and draped for lower abdominal surgery, timeout conducted and antibiotics administered. The patient did not have a preoperative bowel prep. Transverse 60 cm long incision was made from anterior superior iliac crest across to the opposite side, removing a 60 cm long by 12 cm wide ellipse of skin and underlying fatty tissues generous layer of intact fatty tissue was left above the overlying fascia. The lateral one third of this incision was closed with subcutaneous 20 plain and subcuticular 4-0 Vicryl. The midline was then opened in a Pelosi type incision, exposing the uterus which was. Anterior and easily visible. Bowel was packed away using Balfour retractor, tapes and moistened laparotomy tape.Marland Kitchen Uterus was taken down, or first ligating and transecting round ligaments, exposing the infundibulopelvic ligaments with some difficulty, and then be sure the ureter was more well out of the way crossclamping the IP ligaments and doubly ligating these pedicles. Then vessels were skeletonized. The old C-section scar and on the anterior lower uterine segment required careful sharp dissection. There was an opening into the old was C-section scar defect that developed during this dissection. Next line Interestingly, the large endometrial polyp tried to protrude through this defect but did not affect the surgery otherwise. Clamping cutting and transecting the uterine vessels with curved Heaney clamp, suture ligature and Metzenbaum scissor transection was performed. Upper and lower cardinal ligaments were serially clamped cut and suture ligated using straight Heaney clamps and similar surgical technique. Upon reaching the level of the cervix, a stab incision in the anterior cervicovaginal fornix could be performed and the cervix amputated off the cuff. Was then closed, using Aldridge stitches at each lateral  vaginal angle and interrupted 0 chromic sutures across the cuff itself. Throughout the case the patient's tissues were extremely fragile and fatty. Hemostasis was confirmed, pedicles were inspected, pelvis irrigated and laparotomy equipment removed. Anterior peritoneum was closed with 2 chromic, fascia closed with running 0 Prolene, subcutaneous tissues drained with a suprapubic 10 mm  JP drain, allowed to exit through a stab incision in the right lower quadrant skin, then the subcutaneous tissues reapproximated with 20 plain and 4-0 Vicryl subcuticular closure the skin completed the procedure. estimated blood loss 150 cc sponge and needle counts correct.

## 2011-08-11 NOTE — Brief Op Note (Signed)
08/11/2011  1:11 PM  PATIENT:  [name]  51 y.o. female  PRE-OPERATIVE DIAGNOSIS:Atypical endometrial Hyperplasia, postmenopausal bleeding,  Skin breakdown due to obesity  POST-OPERATIVE DIAGNOSIS:Atypical endometrial hyperplasia, postmenopausal bleeding, skin Breakdown due to obesity PROCEDURE:  Procedure(s) (LRB): HYSTERECTOMY ABDOMINAL (N/A) SCAR REVISION (N/A)(partial Panniculectomy SALPINGO OOPHERECTOMY (Bilateral)  SURGEON:  Surgeon(s) and Role:    * [name], MD - Primary  PHYSICIAN ASSISTANT:   ASSISTANTS: d[place], RN-FA   ANESTHESIA:   general  EBL:  Total I/O In: 1900 [I.V.:1900] Out: 450 [Urine:300; Blood:150]  BLOOD ADMINISTERED:none  DRAINS: Foley catheter in bladder, 10 mm Jackson-Pratt drain in subcutaneous fatty space   LOCAL MEDICATIONS USED:  NONE  SPECIMEN:  Source of Specimen:  Cervix uterus tubes and ovaries  DISPOSITION OF SPECIMEN:  PATHOLOGY  COUNTS:  YES  TOURNIQUET:  * No tourniquets in log *  DICTATION: .Dragon Dictation  PLAN OF CARE: Admit to inpatient   PATIENT DISPOSITION:  PACU - hemodynamically stable.   Delay start of Pharmacological VTE agent (>24hrs) due to surgical blood loss or risk of bleeding: not applicable

## 2011-08-11 NOTE — Anesthesia Preprocedure Evaluation (Signed)
Anesthesia Evaluation  Patient identified by MRN, date of birth, ID band Patient awake    Reviewed: Allergy & Precautions, H&P , NPO status , Patient's Chart, lab work & pertinent test results  History of Anesthesia Complications (+) PONV  Airway Mallampati: I      Dental  (+) Teeth Intact   Pulmonary neg pulmonary ROS,  breath sounds clear to auscultation        Cardiovascular hypertension, Pt. on medications Rhythm:Regular Rate:Normal     Neuro/Psych PSYCHIATRIC DISORDERS Depression    GI/Hepatic GERD-  Medicated and Controlled,  Endo/Other  Hypothyroidism Morbid obesity  Renal/GU      Musculoskeletal   Abdominal (+) + obese,   Peds  Hematology   Anesthesia Other Findings   Reproductive/Obstetrics                           Anesthesia Physical Anesthesia Plan  ASA: II  Anesthesia Plan: General   Post-op Pain Management:    Induction: Intravenous, Rapid sequence and Cricoid pressure planned  Airway Management Planned: Oral ETT  Additional Equipment:   Intra-op Plan:   Post-operative Plan: Extubation in OR  Informed Consent: I have reviewed the patients History and Physical, chart, labs and discussed the procedure including the risks, benefits and alternatives for the proposed anesthesia with the patient or authorized representative who has indicated his/her understanding and acceptance.     Plan Discussed with:   Anesthesia Plan Comments:         Anesthesia Quick Evaluation

## 2011-08-11 NOTE — Anesthesia Postprocedure Evaluation (Signed)
Anesthesia Post Note  Patient: [name]  Procedure(s) Performed: Procedure(s) (LRB): HYSTERECTOMY ABDOMINAL (N/A) SCAR REVISION (N/A) SALPINGO OOPHERECTOMY (Bilateral)  Anesthesia type: General  Patient location: PACU  Post pain: Pain level controlled  Post assessment: Post-op Vital signs reviewed, Patient's Cardiovascular Status Stable, Respiratory Function Stable, Patent Airway, No signs of Nausea or vomiting and Pain level controlled  Last Vitals:  Filed Vitals:   08/11/11 1330  BP: 106/61  Pulse: 76  Temp: 36.6 C  Resp: 18    Post vital signs: Reviewed and stable  Level of consciousness: awake and alert   Complications: No apparent anesthesia complications

## 2011-08-11 NOTE — Anesthesia Procedure Notes (Signed)
Procedure Name: Intubation Date/Time: 08/11/2011 10:44 AM Performed by: Franco Nones Pre-anesthesia Checklist: Patient identified, Patient being monitored, Timeout performed, Emergency Drugs available and Suction available Patient Re-evaluated:Patient Re-evaluated prior to inductionOxygen Delivery Method: Circle System Utilized Preoxygenation: Pre-oxygenation with 100% oxygen Intubation Type: IV induction, Rapid sequence and Cricoid Pressure applied Laryngoscope Size: Miller and 2 Grade View: Grade I Tube type: Oral Tube size: 7.0 mm Number of attempts: 1 Airway Equipment and Method: stylet Placement Confirmation: ETT inserted through vocal cords under direct vision,  positive ETCO2 and breath sounds checked- equal and bilateral Secured at: 22 cm Tube secured with: Tape Dental Injury: Teeth and Oropharynx as per pre-operative assessment

## 2011-08-11 NOTE — Transfer of Care (Signed)
Immediate Anesthesia Transfer of Care Note  Patient: [name]  Procedure(s) Performed: Procedure(s) (LRB): HYSTERECTOMY ABDOMINAL (N/A) SCAR REVISION (N/A) SALPINGO OOPHERECTOMY (Bilateral)  Patient Location: PACU  Anesthesia Type: General  Level of Consciousness: awake  Airway & Oxygen Therapy: Patient Spontanous Breathing and non-rebreather face mask  Post-op Assessment: Report given to PACU RN, Post -op Vital signs reviewed and stable and Patient moving all extremities  Post vital signs: Reviewed and stable  Complications: No apparent anesthesia complications

## 2011-08-12 ENCOUNTER — Encounter (HOSPITAL_COMMUNITY): Payer: Self-pay | Admitting: Obstetrics and Gynecology

## 2011-08-12 LAB — BASIC METABOLIC PANEL
BUN: 12 mg/dL (ref 6–23)
CO2: 30 mEq/L (ref 19–32)
Chloride: 101 mEq/L (ref 96–112)
Glucose, Bld: 121 mg/dL — ABNORMAL HIGH (ref 70–99)
Potassium: 4.6 mEq/L (ref 3.5–5.1)
Sodium: 138 mEq/L (ref 135–145)

## 2011-08-12 LAB — CBC
HCT: 40.2 % (ref 36.0–46.0)
Hemoglobin: 12.7 g/dL (ref 12.0–15.0)
MCHC: 31.6 g/dL (ref 30.0–36.0)
RBC: 4.56 MIL/uL (ref 3.87–5.11)

## 2011-08-12 MED ORDER — LACTATED RINGERS IV BOLUS (SEPSIS)
1000.0000 mL | Freq: Once | INTRAVENOUS | Status: AC
  Administered 2011-08-12: 1000 mL via INTRAVENOUS

## 2011-08-12 NOTE — Progress Notes (Signed)
1 Day Post-Op Procedure(s) (LRB): HYSTERECTOMY ABDOMINAL (N/A) SCAR REVISION (N/A) SALPINGO OOPHERECTOMY (Bilateral)  Subjective: Patient reports incisional pain, tolerating PO, + flatus and no problems voiding.    Objective: I have reviewed patient's vital signs and labs.  General: alert, cooperative and no distress Resp: clear to auscultation bilaterally GI: soft, non-tender; bowel sounds normal; no masses,  no organomegaly and incision: intact and Some bloody fluid in the Jackson-Pratt drain Extremities: Homans sign is negative, no sign of DVT  Assessment: s/p Procedure(s) (LRB): HYSTERECTOMY ABDOMINAL (N/A) SCAR REVISION (N/A) SALPINGO OOPHERECTOMY (Bilateral): stable and progressing well  Plan: Advance diet Discontinue IV fluids  LOS: 1 day    Leeandre Nordling V 08/12/2011, 9:17 AM

## 2011-08-12 NOTE — Care Management Note (Unsigned)
    Page 1 of 1   08/12/2011     11:45:04 AM   CARE MANAGEMENT NOTE 08/12/2011  Patient:  [name], [name]   Account Number:  [id number]  Date Initiated:  08/12/2011  Documentation initiated by:  Sharrie Rothman  Subjective/Objective Assessment:   Pt admitted from home s/p abd hysterectomy. Pt lives with her children but plans to go stay with her sister for a while at discharge. Pt is independent with ADL's.     Action/Plan:   No CM needs noted.   Anticipated DC Date:  08/13/2011   Anticipated DC Plan:  HOME/SELF CARE      DC Planning Services  CM consult      Choice offered to / List presented to:             Status of service:  Completed, signed off Medicare Important Message given?   (If response is "NO", the following Medicare IM given date fields will be blank) Date Medicare IM given:   Date Additional Medicare IM given:    Discharge Disposition:    Per UR Regulation:    If discussed at Long Length of Stay Meetings, dates discussed:    Comments:  08/12/11 1144 Arlyss Queen, RN BSN CM

## 2011-08-12 NOTE — Progress Notes (Signed)
UR Chart Review Completed  

## 2011-08-12 NOTE — Anesthesia Postprocedure Evaluation (Signed)
Anesthesia Post Note  Patient: [name]  Procedure(s) Performed: Procedure(s) (LRB): HYSTERECTOMY ABDOMINAL (N/A) SCAR REVISION (N/A) SALPINGO OOPHERECTOMY (Bilateral)  Anesthesia type: General  Patient location: 331  Post pain: Pain level controlled  Post assessment: Post-op Vital signs reviewed, Patient's Cardiovascular Status Stable, Respiratory Function Stable, Patent Airway, No signs of Nausea or vomiting and Pain level controlled   Post vital signs: Reviewed and stable  Level of consciousness: awake and alert   Complications: No apparent anesthesia complications

## 2011-08-12 NOTE — Addendum Note (Signed)
Addendum  created 08/12/11 0836 by Franco Nones, CRNA   Modules edited:Notes Section

## 2011-08-13 LAB — CBC WITH DIFFERENTIAL/PLATELET
Basophils Relative: 0 % (ref 0–1)
Eosinophils Absolute: 0.1 10*3/uL (ref 0.0–0.7)
Eosinophils Relative: 2 % (ref 0–5)
HCT: 31.8 % — ABNORMAL LOW (ref 36.0–46.0)
Hemoglobin: 10.3 g/dL — ABNORMAL LOW (ref 12.0–15.0)
Lymphs Abs: 1.7 10*3/uL (ref 0.7–4.0)
MCH: 28.6 pg (ref 26.0–34.0)
MCHC: 32.4 g/dL (ref 30.0–36.0)
MCV: 88.3 fL (ref 78.0–100.0)
Monocytes Absolute: 0.6 10*3/uL (ref 0.1–1.0)
Monocytes Relative: 8 % (ref 3–12)
Neutrophils Relative %: 66 % (ref 43–77)
RBC: 3.6 MIL/uL — ABNORMAL LOW (ref 3.87–5.11)

## 2011-08-13 NOTE — Progress Notes (Signed)
2 Days Post-Op Procedure(s) (LRB): HYSTERECTOMY ABDOMINAL (N/A) SCAR REVISION (N/A) SALPINGO OOPHERECTOMY (Bilateral)  Subjective: Patient reports no problems voiding.she feels well this am but had low Bp's without suspicion of acute blood loss last nite. No pain, + bowel fxn   Objective: I have reviewed patient's vital signs, intake and output and medications.  General: alert, cooperative and no distress Abdomen soft. Some ecchymosis on the right side. No mas efficet   Assessment: s/p Procedure(s) (LRB): HYSTERECTOMY ABDOMINAL (N/A) SCAR REVISION (N/A) SALPINGO OOPHERECTOMY (Bilateral): INcisional bleedingl  Check hgb. Keep til Friday as inpt. Plan: Advance diet Encourage ambulation Clear liquids  LOS: 2 days    [name] V 08/13/2011, 11:32 AM

## 2011-08-13 NOTE — Progress Notes (Signed)
D- Pt's BP 88/54 HR=92.  Pt denies complaints related to hypotension.  Abdomen remains soft but tender due to surgical site.  Dressing has old drainage noted around JP drain site.   A-Dr. Despina Hidden notified via phone of low BP and he gave new orders for LR 1,069ml bolus to be given and to discontinue dilaudid PCA.  LR bolus given at 2226 via pump at 934ml/hr.  Pt was ambulated to bathroom to void after bolus completed and denied s/s of hypotension such as dizziness. Upon assisting patient back to bed her BP was 107/68 at 2358.  BP at 0050 was 90/50.  BP 83/58 at 0130 and  I notified Dr. Despina Hidden again about her BP and no further orders were given.  BP at 0220 was 107/75. R- This am patient's BP is 105/65.  Pt voices pain in abdomen and Percocet 1 tablet was given.  Pt in bed and denies complaints at this time.  Nursing staff to continue to monitor.

## 2011-08-14 ENCOUNTER — Encounter (HOSPITAL_COMMUNITY)
Admission: RE | Disposition: A | Discharge status: Home / Self Care | Payer: Self-pay | Source: Ambulatory Visit | Attending: Obstetrics and Gynecology

## 2011-08-14 ENCOUNTER — Encounter (HOSPITAL_COMMUNITY): Payer: Self-pay | Admitting: Anesthesiology

## 2011-08-14 ENCOUNTER — Inpatient Hospital Stay (HOSPITAL_COMMUNITY): Admitting: Anesthesiology

## 2011-08-14 ENCOUNTER — Encounter (HOSPITAL_COMMUNITY): Payer: Self-pay | Admitting: *Deleted

## 2011-08-14 ENCOUNTER — Ambulatory Visit: Admit: 2011-08-14 | Payer: Self-pay | Admitting: Obstetrics and Gynecology

## 2011-08-14 DIAGNOSIS — IMO0002 Reserved for concepts with insufficient information to code with codable children: Secondary | ICD-10-CM | POA: Diagnosis present

## 2011-08-14 HISTORY — PX: HEMATOMA EVACUATION: SHX5118

## 2011-08-14 LAB — CBC
MCH: 28.6 pg (ref 26.0–34.0)
MCHC: 32.8 g/dL (ref 30.0–36.0)
MCV: 87.2 fL (ref 78.0–100.0)
Platelets: 192 10*3/uL (ref 150–400)
RBC: 3.36 MIL/uL — ABNORMAL LOW (ref 3.87–5.11)

## 2011-08-14 SURGERY — EVACUATION HEMATOMA
Anesthesia: General | Site: Abdomen | Wound class: Clean

## 2011-08-14 MED ORDER — MIDAZOLAM HCL 2 MG/2ML IJ SOLN
INTRAMUSCULAR | Status: AC
  Filled 2011-08-14: qty 2

## 2011-08-14 MED ORDER — DEXAMETHASONE SODIUM PHOSPHATE 4 MG/ML IJ SOLN
INTRAMUSCULAR | Status: AC
  Filled 2011-08-14: qty 1

## 2011-08-14 MED ORDER — DOCUSATE SODIUM 100 MG PO CAPS
100.0000 mg | ORAL_CAPSULE | Freq: Two times a day (BID) | ORAL | Status: DC
  Administered 2011-08-14 – 2011-08-16 (×4): 100 mg via ORAL
  Filled 2011-08-14 (×4): qty 1

## 2011-08-14 MED ORDER — SCOPOLAMINE 1 MG/3DAYS TD PT72
1.0000 | MEDICATED_PATCH | Freq: Once | TRANSDERMAL | Status: DC
  Administered 2011-08-14: 1.5 mg via TRANSDERMAL
  Filled 2011-08-14: qty 1

## 2011-08-14 MED ORDER — SODIUM CHLORIDE 0.9 % IV SOLN
INTRAVENOUS | Status: DC
  Administered 2011-08-14: 200 mL/h via INTRAVENOUS

## 2011-08-14 MED ORDER — ONDANSETRON HCL 4 MG/2ML IJ SOLN
INTRAMUSCULAR | Status: AC
  Filled 2011-08-14: qty 2

## 2011-08-14 MED ORDER — NEOSTIGMINE METHYLSULFATE 1 MG/ML IJ SOLN
INTRAMUSCULAR | Status: AC
  Filled 2011-08-14: qty 10

## 2011-08-14 MED ORDER — LACTATED RINGERS IV SOLN
INTRAVENOUS | Status: DC
  Administered 2011-08-14 (×2): via INTRAVENOUS

## 2011-08-14 MED ORDER — CEFAZOLIN SODIUM-DEXTROSE 2-3 GM-% IV SOLR
INTRAVENOUS | Status: AC
  Filled 2011-08-14: qty 50

## 2011-08-14 MED ORDER — SCOPOLAMINE 1 MG/3DAYS TD PT72
MEDICATED_PATCH | TRANSDERMAL | Status: AC
  Filled 2011-08-14: qty 1

## 2011-08-14 MED ORDER — CEFAZOLIN SODIUM 1-5 GM-% IV SOLN
INTRAVENOUS | Status: AC
  Filled 2011-08-14: qty 50

## 2011-08-14 MED ORDER — MIDAZOLAM HCL 2 MG/2ML IJ SOLN
1.0000 mg | INTRAMUSCULAR | Status: DC | PRN
  Administered 2011-08-14: 2 mg via INTRAVENOUS

## 2011-08-14 MED ORDER — NEOSTIGMINE METHYLSULFATE 1 MG/ML IJ SOLN
INTRAMUSCULAR | Status: DC | PRN
  Administered 2011-08-14: 2 mg via INTRAVENOUS

## 2011-08-14 MED ORDER — ONDANSETRON HCL 4 MG/2ML IJ SOLN: 4.0000 mg | Freq: Four times a day (QID) | INTRAMUSCULAR | Status: DC | PRN

## 2011-08-14 MED ORDER — SIMETHICONE 80 MG PO CHEW: 80.0000 mg | CHEWABLE_TABLET | Freq: Four times a day (QID) | ORAL | Status: DC | PRN

## 2011-08-14 MED ORDER — FENTANYL CITRATE 0.05 MG/ML IJ SOLN
25.0000 ug | INTRAMUSCULAR | Status: DC | PRN
  Administered 2011-08-14: 50 ug via INTRAVENOUS
  Administered 2011-08-14 (×2): 25 ug via INTRAVENOUS

## 2011-08-14 MED ORDER — EPHEDRINE SULFATE 50 MG/ML IJ SOLN
INTRAMUSCULAR | Status: AC
  Filled 2011-08-14: qty 1

## 2011-08-14 MED ORDER — FENTANYL CITRATE 0.05 MG/ML IJ SOLN
INTRAMUSCULAR | Status: AC
  Administered 2011-08-14: 25 ug via INTRAVENOUS
  Filled 2011-08-14: qty 2

## 2011-08-14 MED ORDER — SUCCINYLCHOLINE CHLORIDE 20 MG/ML IJ SOLN
INTRAMUSCULAR | Status: DC | PRN
  Administered 2011-08-14: 200 mg via INTRAVENOUS

## 2011-08-14 MED ORDER — ROCURONIUM BROMIDE 100 MG/10ML IV SOLN
INTRAVENOUS | Status: DC | PRN
  Administered 2011-08-14: 15 mg via INTRAVENOUS

## 2011-08-14 MED ORDER — ONDANSETRON HCL 4 MG/2ML IJ SOLN
4.0000 mg | Freq: Once | INTRAMUSCULAR | Status: AC
  Administered 2011-08-14: 4 mg via INTRAVENOUS

## 2011-08-14 MED ORDER — CEFAZOLIN SODIUM 1-5 GM-% IV SOLN
INTRAVENOUS | Status: DC | PRN
  Administered 2011-08-14: 2 g via INTRAVENOUS
  Administered 2011-08-14: 1 g via INTRAVENOUS

## 2011-08-14 MED ORDER — HYDROMORPHONE HCL PF 1 MG/ML IJ SOLN
0.2000 mg | INTRAMUSCULAR | Status: DC | PRN
  Filled 2011-08-14: qty 1

## 2011-08-14 MED ORDER — METOCLOPRAMIDE HCL 10 MG PO TABS: 10.0000 mg | ORAL_TABLET | Freq: Once | ORAL | Status: DC

## 2011-08-14 MED ORDER — CEFAZOLIN SODIUM-DEXTROSE 2-3 GM-% IV SOLR: 2.0000 g | INTRAVENOUS | Status: DC

## 2011-08-14 MED ORDER — 0.9 % SODIUM CHLORIDE (POUR BTL) OPTIME
TOPICAL | Status: DC | PRN
  Administered 2011-08-14: 1000 mL

## 2011-08-14 MED ORDER — ONDANSETRON HCL 4 MG/2ML IJ SOLN: 4.0000 mg | Freq: Once | INTRAMUSCULAR | Status: DC | PRN

## 2011-08-14 MED ORDER — FENTANYL CITRATE 0.05 MG/ML IJ SOLN
INTRAMUSCULAR | Status: DC | PRN
  Administered 2011-08-14 (×2): 50 ug via INTRAVENOUS

## 2011-08-14 MED ORDER — PANTOPRAZOLE SODIUM 40 MG PO TBEC
40.0000 mg | DELAYED_RELEASE_TABLET | Freq: Every day | ORAL | Status: DC
  Administered 2011-08-14 – 2011-08-16 (×3): 40 mg via ORAL
  Filled 2011-08-14 (×4): qty 1

## 2011-08-14 MED ORDER — ROCURONIUM BROMIDE 50 MG/5ML IV SOLN
INTRAVENOUS | Status: AC
  Filled 2011-08-14: qty 1

## 2011-08-14 MED ORDER — SODIUM CHLORIDE 0.9 % IV SOLN
INTRAVENOUS | Status: DC
  Administered 2011-08-14 – 2011-08-15 (×2): via INTRAVENOUS

## 2011-08-14 MED ORDER — OXYCODONE-ACETAMINOPHEN 5-325 MG PO TABS
1.0000 | ORAL_TABLET | ORAL | Status: DC | PRN
  Administered 2011-08-14: 1 via ORAL
  Administered 2011-08-15: 2 via ORAL
  Administered 2011-08-15 – 2011-08-16 (×5): 1 via ORAL
  Filled 2011-08-14 (×2): qty 1
  Filled 2011-08-14: qty 2
  Filled 2011-08-14 (×4): qty 1

## 2011-08-14 MED ORDER — DEXAMETHASONE SODIUM PHOSPHATE 4 MG/ML IJ SOLN
4.0000 mg | Freq: Once | INTRAMUSCULAR | Status: AC
  Administered 2011-08-14: 4 mg via INTRAVENOUS

## 2011-08-14 MED ORDER — SUCCINYLCHOLINE CHLORIDE 20 MG/ML IJ SOLN
INTRAMUSCULAR | Status: AC
  Filled 2011-08-14: qty 1

## 2011-08-14 MED ORDER — CEFAZOLIN SODIUM-DEXTROSE 2-3 GM-% IV SOLR
2.0000 g | Freq: Four times a day (QID) | INTRAVENOUS | Status: AC
  Administered 2011-08-14 – 2011-08-15 (×3): 2 g via INTRAVENOUS
  Filled 2011-08-14 (×3): qty 50

## 2011-08-14 MED ORDER — LIDOCAINE HCL (PF) 1 % IJ SOLN
INTRAMUSCULAR | Status: AC
  Filled 2011-08-14: qty 5

## 2011-08-14 MED ORDER — PROPOFOL 10 MG/ML IV BOLUS
INTRAVENOUS | Status: DC | PRN
  Administered 2011-08-14: 180 mg via INTRAVENOUS

## 2011-08-14 MED ORDER — SODIUM CHLORIDE 0.9 % IJ SOLN
INTRAMUSCULAR | Status: AC
  Filled 2011-08-14: qty 3

## 2011-08-14 MED ORDER — EPHEDRINE SULFATE 50 MG/ML IJ SOLN
INTRAMUSCULAR | Status: DC | PRN
  Administered 2011-08-14 (×2): 10 mg via INTRAVENOUS

## 2011-08-14 MED ORDER — ZOLPIDEM TARTRATE 5 MG PO TABS: 5.0000 mg | ORAL_TABLET | Freq: Every evening | ORAL | Status: DC | PRN

## 2011-08-14 MED ORDER — CEFAZOLIN SODIUM 1-5 GM-% IV SOLN: 1.0000 g | INTRAVENOUS | Status: DC

## 2011-08-14 MED ORDER — PROPOFOL 10 MG/ML IV EMUL
INTRAVENOUS | Status: AC
  Filled 2011-08-14: qty 20

## 2011-08-14 MED ORDER — MIDAZOLAM HCL 5 MG/5ML IJ SOLN
INTRAMUSCULAR | Status: DC | PRN
  Administered 2011-08-14 (×2): 2 mg via INTRAVENOUS

## 2011-08-14 MED ORDER — ONDANSETRON HCL 4 MG PO TABS: 4.0000 mg | ORAL_TABLET | Freq: Four times a day (QID) | ORAL | Status: DC | PRN

## 2011-08-14 SURGICAL SUPPLY — 43 items
APL SKNCLS STERI-STRIP NONHPOA (GAUZE/BANDAGES/DRESSINGS) ×1
BAG HAMPER (MISCELLANEOUS) ×2 IMPLANT
BENZOIN TINCTURE PRP APPL 2/3 (GAUZE/BANDAGES/DRESSINGS) ×1 IMPLANT
CLOTH BEACON ORANGE TIMEOUT ST (SAFETY) ×2 IMPLANT
COVER LIGHT HANDLE STERIS (MISCELLANEOUS) ×4 IMPLANT
DECANTER SPIKE VIAL GLASS SM (MISCELLANEOUS) ×1 IMPLANT
DRESSING TELFA 8X3 (GAUZE/BANDAGES/DRESSINGS) ×1 IMPLANT
ELECT REM PT RETURN 9FT ADLT (ELECTROSURGICAL) ×2
ELECTRODE REM PT RTRN 9FT ADLT (ELECTROSURGICAL) ×1 IMPLANT
EVACUATOR DRAINAGE 10X20 100CC (DRAIN) ×1 IMPLANT
EVACUATOR SILICONE 100CC (DRAIN) ×2
GLOVE ECLIPSE 9.0 STRL (GLOVE) ×2 IMPLANT
GLOVE INDICATOR 7.0 STRL GRN (GLOVE) ×2 IMPLANT
GLOVE INDICATOR STER SZ 9 (GLOVE) ×2 IMPLANT
GLOVE SKINSENSE STRL SZ6.0 (GLOVE) ×1 IMPLANT
GLOVE SS PI 9.0 STRL (GLOVE) ×1 IMPLANT
GOWN STRL REIN 3XL LVL4 (GOWN DISPOSABLE) ×2 IMPLANT
GOWN STRL REIN XL XLG (GOWN DISPOSABLE) ×3 IMPLANT
INST SET MAJOR GENERAL (KITS) ×2 IMPLANT
KIT REMOVER STAPLE SKIN (MISCELLANEOUS) ×1 IMPLANT
KIT ROOM TURNOVER APOR (KITS) ×2 IMPLANT
MANIFOLD NEPTUNE II (INSTRUMENTS) ×2 IMPLANT
NS IRRIG 1000ML POUR BTL (IV SOLUTION) ×2 IMPLANT
PACK ABDOMINAL MAJOR (CUSTOM PROCEDURE TRAY) ×2 IMPLANT
PAD ABD 5X9 TENDERSORB (GAUZE/BANDAGES/DRESSINGS) ×2 IMPLANT
PAD ARMBOARD 7.5X6 YLW CONV (MISCELLANEOUS) ×2 IMPLANT
SET BASIN LINEN APH (SET/KITS/TRAYS/PACK) ×2 IMPLANT
SOL PREP PROV IODINE SCRUB 4OZ (MISCELLANEOUS) ×2 IMPLANT
SPONGE GAUZE 4X4 12PLY (GAUZE/BANDAGES/DRESSINGS) ×2 IMPLANT
SPONGE LAP 18X18 X RAY DECT (DISPOSABLE) ×1 IMPLANT
STAPLER VISISTAT 35W (STAPLE) ×2 IMPLANT
STRIP CLOSURE SKIN 1/2X4 (GAUZE/BANDAGES/DRESSINGS) ×2 IMPLANT
SUT CHROMIC 0 CT 1 (SUTURE) ×2 IMPLANT
SUT CHROMIC 2 0 CT 1 (SUTURE) IMPLANT
SUT ETHILON 3 0 FSL (SUTURE) ×2 IMPLANT
SUT PLAIN CT 1/2CIR 2-0 27IN (SUTURE) ×6 IMPLANT
SUT PROLENE 0 CT 1 30 (SUTURE) IMPLANT
SUT VIC AB 0 CT1 27 (SUTURE)
SUT VIC AB 0 CT1 27XBRD ANTBC (SUTURE) IMPLANT
SUT VIC AB 2-0 CT2 27 (SUTURE) IMPLANT
SUT VIC AB 3-0 SH 27 (SUTURE)
SUT VIC AB 3-0 SH 27X BRD (SUTURE) IMPLANT
SUT VICRYL 4 0 KS 27 (SUTURE) ×1 IMPLANT

## 2011-08-14 NOTE — Anesthesia Preprocedure Evaluation (Signed)
Anesthesia Evaluation  Patient identified by MRN, date of birth, ID band Patient awake    Reviewed: Allergy & Precautions, H&P , NPO status , Patient's Chart, lab work & pertinent test results  History of Anesthesia Complications (+) PONV  Airway Mallampati: I      Dental  (+) Teeth Intact   Pulmonary neg pulmonary ROS,  breath sounds clear to auscultation        Cardiovascular hypertension, Pt. on medications Rhythm:Regular Rate:Normal     Neuro/Psych PSYCHIATRIC DISORDERS Depression    GI/Hepatic GERD-  Medicated and Controlled,  Endo/Other  Hypothyroidism Morbid obesity  Renal/GU      Musculoskeletal   Abdominal (+) + obese,   Peds  Hematology   Anesthesia Other Findings   Reproductive/Obstetrics                           Anesthesia Physical Anesthesia Plan  ASA: II  Anesthesia Plan: General   Post-op Pain Management:    Induction: Intravenous and Cricoid pressure planned  Airway Management Planned: Oral ETT  Additional Equipment:   Intra-op Plan:   Post-operative Plan: Extubation in OR  Informed Consent: I have reviewed the patients History and Physical, chart, labs and discussed the procedure including the risks, benefits and alternatives for the proposed anesthesia with the patient or authorized representative who has indicated his/her understanding and acceptance.     Plan Discussed with:   Anesthesia Plan Comments:         Anesthesia Quick Evaluation

## 2011-08-14 NOTE — Op Note (Signed)
The operative details included in brief operative

## 2011-08-14 NOTE — Transfer of Care (Signed)
Immediate Anesthesia Transfer of Care Note  Patient: [name]  Procedure(s) Performed: Procedure(s) (LRB): EVACUATION HEMATOMA (N/A)  Patient Location: PACU  Anesthesia Type: General  Level of Consciousness: awake, alert  and patient cooperative  Airway & Oxygen Therapy: Patient Spontanous Breathing and Patient connected to face mask oxygen  Post-op Assessment: Report given to PACU RN, Post -op Vital signs reviewed and stable and Patient moving all extremities  Post vital signs: Reviewed and stable  Complications: No apparent anesthesia complications

## 2011-08-14 NOTE — Progress Notes (Signed)
3 Days Post-Op Procedure(s) (LRB): HYSTERECTOMY ABDOMINAL (N/A) SCAR REVISION (N/A) SALPINGO OOPHERECTOMY (Bilateral)  Subjective: Patient reports incisional pain.  A burning sensation that has persisted since the second day when she experienced burning while moving to bathroom. Pt was hypotensive that evening and her Hgb has dropped significantly in comparison to operative blood loss estimate of 150 cc  Objective: I have reviewed patient's vital signs and labs.  General: alert, cooperative and mild distress GI: incision: opened on floor sufficiently to identify a very large hematoma deep in midportion of incision primarily on the patient's right side, warranting return to OR for adequate evacuation  Assessment: s/p Procedure(s) (LRB): HYSTERECTOMY ABDOMINAL (N/A) SCAR REVISION (N/A) SALPINGO OOPHERECTOMY (Bilateral): stable and postop wound hematoma  Plan: NPO To OR later today.  Pt ate last at 8 am.  Will likely perform around 2 pm.  LOS: 3 days    Jetaime Pinnix V 08/14/2011, 10:10 AM

## 2011-08-14 NOTE — Progress Notes (Signed)
Pt O2 sats 78%-85% on room air, O2 applied via nasal cannula at 2lpm, O2 sats increased to 94% Dr. Emelda Fear made aware.

## 2011-08-14 NOTE — OR Nursing (Signed)
Voided   Time once .  Appr. 100cc yellow urine

## 2011-08-14 NOTE — Anesthesia Postprocedure Evaluation (Signed)
  Anesthesia Post-op Note  Patient: [name]  Procedure(s) Performed: Procedure(s) (LRB): EVACUATION HEMATOMA (N/A)  Patient Location: PACU  Anesthesia Type: General  Level of Consciousness: awake, alert , oriented and patient cooperative  Airway and Oxygen Therapy: Patient Spontanous Breathing  Post-op Pain: 4 /10, mild  Post-op Assessment: Post-op Vital signs reviewed, Patient's Cardiovascular Status Stable, Respiratory Function Stable, Patent Airway, No signs of Nausea or vomiting and Pain level controlled  Post-op Vital Signs: Reviewed and stable  Complications: No apparent anesthesia complications

## 2011-08-14 NOTE — Anesthesia Procedure Notes (Signed)
Procedure Name: Intubation Date/Time: 08/14/2011 2:25 PM Performed by: Despina Hidden Pre-anesthesia Checklist: Emergency Drugs available, Patient identified, Patient being monitored and Suction available Patient Re-evaluated:Patient Re-evaluated prior to inductionOxygen Delivery Method: Circle system utilized Preoxygenation: Pre-oxygenation with 100% oxygen Intubation Type: IV induction, Cricoid Pressure applied and Rapid sequence Ventilation: Mask ventilation without difficulty Laryngoscope Size: Mac and 3 Grade View: Grade I Tube type: Oral Number of attempts: 1 Airway Equipment and Method: Stylet Placement Confirmation: ETT inserted through vocal cords under direct vision,  positive ETCO2 and breath sounds checked- equal and bilateral Secured at: 22 cm Tube secured with: Tape Dental Injury: Teeth and Oropharynx as per pre-operative assessment

## 2011-08-14 NOTE — Brief Op Note (Signed)
08/11/2011 - 08/14/2011  3:59 PM  PATIENT:  [name]  51 y.o. female  PRE-OPERATIVE DIAGNOSIS:  Hematoma, wound   POST-OPERATIVE DIAGNOSIS:  Hematoma, abdominal wound, subcutaneous  PROCEDURE:  Procedure(s) (LRB): EVACUATION HEMATOMA (N/A)  SURGEON:  Surgeon(s) and Role:    * [name], MD - Primary  PHYSICIAN ASSISTANT:   ASSISTANTS: none   ANESTHESIA:   general  EBL:  Total I/O In: 1560 [P.O.:360; I.V.:1200] Out: 1330 [Urine:1250; Drains:30; Blood:50]  BLOOD ADMINISTERED:none  DRAINS: (sub cutaneous space x 2) Jackson-Pratt drain(s) with closed bulb suction in the subcutaneous space   LOCAL MEDICATIONS USED:  NONE  SPECIMEN:  No Specimen  DISPOSITION OF SPECIMEN:  N/A  COUNTS:  YES  TOURNIQUET:  * No tourniquets in log *  DICTATION: .Dragon Dictation patient was taken operating room prepped and draped for lower surgery. The timeout was conducted and procedure confirmed and since 3 g IV administered. The old incision was opened after prepping and draping, a large amount of diffuse clot identified extending from the right end of the incision to just left of the midline, with serous blood extending in the deep tissues all the way to the left extent of the incision we opened the incision over the right two thirds, and released all the subcutaneous and subcuticular stitches. Generous evacuation of clots was performed and then irrigation and removal of additional clots performed. Careful inspection of the surfaces were performed to try to identify any ongoing bleeding. None was identifiable. Additional irrigation was performed. Is second subcutaneous Jackson-Pratt drain was placed in a from the suprapubic area extended and extended into the left side of the incision. The subcutaneous tissues were then closed with large bulk sutures of 20 plain pulling the subcutaneous tissue together to the midline using 2 separate layers of sutures. The subcuticular 4-0 Vicryl was again  used to close the skin and Steri-Strips applied and applied, pressure dressing, an abdominal binder placed. Patient to recovery room in stable condition will be kept on antibiotics x48 hours due to risk factors, free opened incision, extensive infiltration of subcutaneous fatty tissue with blood that could not be completely removed. Sponge and needle counts correct PLAN OF CARE: Admit to inpatient   PATIENT DISPOSITION:  PACU - hemodynamically stable.   Delay start of Pharmacological VTE agent (>24hrs) due to surgical blood loss or risk of bleeding: not applicable

## 2011-08-15 LAB — CBC
Hemoglobin: 9.3 g/dL — ABNORMAL LOW (ref 12.0–15.0)
MCH: 28.2 pg (ref 26.0–34.0)
MCHC: 32.1 g/dL (ref 30.0–36.0)
MCV: 87.9 fL (ref 78.0–100.0)

## 2011-08-15 LAB — BASIC METABOLIC PANEL
Calcium: 9.3 mg/dL (ref 8.4–10.5)
Creatinine, Ser: 0.93 mg/dL (ref 0.50–1.10)
GFR calc non Af Amer: 69 mL/min — ABNORMAL LOW (ref >90)
Glucose, Bld: 129 mg/dL — ABNORMAL HIGH (ref 70–99)
Sodium: 138 mEq/L (ref 135–145)

## 2011-08-15 MED ORDER — LEVOTHYROXINE SODIUM 75 MCG PO TABS: 150.0000 ug | ORAL_TABLET | Freq: Every day | ORAL | Status: DC

## 2011-08-15 MED ORDER — LEVOTHYROXINE SODIUM 75 MCG PO TABS
150.0000 ug | ORAL_TABLET | Freq: Every day | ORAL | Status: DC
  Administered 2011-08-15 – 2011-08-16 (×2): 150 ug via ORAL
  Filled 2011-08-15 (×2): qty 2

## 2011-08-15 MED ORDER — SODIUM CHLORIDE 0.9 % IJ SOLN
3.0000 mL | Freq: Two times a day (BID) | INTRAMUSCULAR | Status: DC
  Administered 2011-08-15 – 2011-08-16 (×3): 3 mL via INTRAVENOUS
  Filled 2011-08-15 (×3): qty 3

## 2011-08-15 MED ORDER — CIPROFLOXACIN HCL 250 MG PO TABS
500.0000 mg | ORAL_TABLET | Freq: Two times a day (BID) | ORAL | Status: DC
  Administered 2011-08-15 – 2011-08-16 (×3): 500 mg via ORAL
  Filled 2011-08-15: qty 2
  Filled 2011-08-15: qty 1
  Filled 2011-08-15 (×2): qty 2

## 2011-08-15 MED ORDER — ESCITALOPRAM OXALATE 10 MG PO TABS
20.0000 mg | ORAL_TABLET | Freq: Every day | ORAL | Status: DC
  Administered 2011-08-15 – 2011-08-16 (×2): 20 mg via ORAL
  Filled 2011-08-15 (×2): qty 2

## 2011-08-15 NOTE — Anesthesia Postprocedure Evaluation (Signed)
  Anesthesia Post-op Note  Patient: [name]  Procedure(s) Performed: Procedure(s) (LRB): EVACUATION HEMATOMA (N/A)  Patient Location: room 331  Anesthesia Type: General  Level of Consciousness: awake, alert , oriented and patient cooperative  Airway and Oxygen Therapy: Patient Spontanous Breathing  Post-op Pain: none  Post-op Assessment: Post-op Vital signs reviewed, Patient's Cardiovascular Status Stable, Respiratory Function Stable, Patent Airway and Pain level controlled  Post-op Vital Signs: Reviewed and stable  Complications: No apparent anesthesia complications

## 2011-08-15 NOTE — Progress Notes (Signed)
1 Day Post-Op Procedure(s) (LRB): EVACUATION HEMATOMA (N/A)  Subjective: Patient reports incisional pain and tolerating PO. Pain is minimal, "not really none" but pt hasn't been out of bed yet.    Objective: I have reviewed patient's vital signs, labs and pathology from original surgery is pending.  General: alert, cooperative, no distress, morbidly obese and comfortable, with SaO2 satisfactory on room air GI: normal findings: soft, non-tender and incision: clean, dry and JP drain  producing pink blood tinged fluid Lab Results  Component Value Date   HGB 9.3* 08/15/2011    Assessment: s/p Procedure(s) (LRB): EVACUATION HEMATOMA (N/A): stable, tolerating diet and anemia  Plan: Advance diet Discontinue IV fluids discontinue foley.  Start PO antibiotics as precaution due to reoperation  and size of wound  LOS: 4 days    Samule Life V 08/15/2011, 9:38 AM

## 2011-08-15 NOTE — Progress Notes (Addendum)
Dr. Despina Hidden notified that during shift change, Dr. Emelda Fear told day RN that he planned to change pain medication to Toradol to decrease sedation, due to decreased O2 sats earlier this afternoon. Sats have been appropriate on 2L of O2. Dr. Despina Hidden said to proceed with giving Percocet for tonight since no order was placed for Toradol, due to recent hematoma. Sheryn Bison

## 2011-08-15 NOTE — Addendum Note (Signed)
Addendum  created 08/15/11 1309 by Michelena Culmer J Tehran Rabenold, CRNA   Modules edited:Notes Section    

## 2011-08-16 MED ORDER — OXYCODONE-ACETAMINOPHEN 5-325 MG PO TABS: 1.0000 | ORAL_TABLET | ORAL | Status: AC | PRN

## 2011-08-16 MED ORDER — DSS 100 MG PO CAPS: 100.0000 mg | ORAL_CAPSULE | Freq: Two times a day (BID) | ORAL | Status: AC

## 2011-08-16 MED ORDER — CIPROFLOXACIN HCL 500 MG PO TABS: 500.0000 mg | ORAL_TABLET | Freq: Two times a day (BID) | ORAL | Status: AC

## 2011-08-16 NOTE — Progress Notes (Signed)
Pt with orders to be discharged. Discharge instructions given, pt verbalized understanding. Pt return demonstration on emptying JP drains. Pt in stable condition upon discharge. Pt left via private vehicle with family.

## 2011-08-16 NOTE — Plan of Care (Signed)
Problem: Phase II Progression Outcomes Goal: Pain controlled Outcome: Progressing Pts pain has been no greater than 4/10 today. Pt is currently receiving 1 percocet when she complains of pain, this is effective.

## 2011-08-16 NOTE — Discharge Summary (Signed)
Physician Discharge Summary   Patient ID: [name] [id number] 51 y.o. 03-13-1960  Admit date: 08/11/2011  Discharge date and time: No discharge date for patient encounter. 08/16/2011  Admitting Physician: [name], MD   Discharge Physician: Emelda Fear  Admit Diagnosis:  Atypical Endometrial Hyperplasia, Morbid O'besity, Chronic Hypertension  Discharge Diagnoses: Atypical Endometrial Hyperplasia(final Pathology pending), Postoperative wound Hematoma, Postoperative Anemia,    Admission Condition: good  Discharged Condition: good  Indication for Admission: surgery  Hospital Course: [name] is an 51 y.o. female. She is admitted for total abdominal hysterectomy bilateral salpingo-oophorectomy with removal of cervix for atypical endometrial hyperplasia. She experienced postmenopausal bleeding in May, had ultrasound showing endometrial thickening with endometrial biopsy being reported as " at least complex atypical hyperplasia. "Hysterectomy with removal of tubes and ovaries is planned.  3 Days Post-Op Procedure(s) (LRB):  HYSTERECTOMY ABDOMINAL (N/A)  SCAR REVISION (N/A)  SALPINGO OOPHERECTOMY (Bilateral)  Subjective:  Patient reports incisional pain. A burning sensation that has persisted since the second day when she experienced burning while moving to bathroom. Pt was hypotensive that evening and her Hgb has dropped significantly in comparison to operative blood loss estimate of 150 cc  GI: incision: opened on floor sufficiently to identify a very large hematoma deep in midportion of incision primarily on the patient's right side, warranting return to OR for adequate evacuation  ): stable and postop wound hematoma   Postoperative wound hematoma was evacuated on postop day 3 and a second Jackson-Pratt drain placed in the subcutaneous space after careful evacuation of as much of the hematoma and ecchymosis as possible she received 24 hours of prophylactic antibiotics and  decision was made to convert promptly to oral antibiotics due to significant increased risk of subsequent infection. This will be continued as an outpatient for 7 days. White count remained normal throughout this procedure with hemoglobin 14.3 pre-operatively, 12.7 postop day 1 dropping to 9.6 before evacuation of hematoma and 9.3 post- evacuation of hematoma. Patient was afebrile with minimal pain complaints and considered stable for discharge on 08/16/2011 for followup [facility] Blood pressures remained low normal during the postoperative days, and blood pressure medicines will be held until postop visit. She has home blood pressure monitoring capabilities and is advised to start the blood pressure medicines when systolic reaches 086 mm mercury systolic or 90 diastolic   Consults: None  Significant Diagnostic Studies: labs: pathology pending from hysterectomy Lab Results  Component Value Date   WBC 5.6 08/15/2011   HGB 9.3* 08/15/2011   HCT 29.0* 08/15/2011   MCV 87.9 08/15/2011   PLT 207 08/15/2011    Treatments: surgery: 08/11/2011 total abdominal hysterectomy partial panniculectomy with placement of Jackson-Pratt drain Surgery 08/14/2011 evacuation of wound hematoma   Discharge Exam: General appearance: alert and no distress Abdomen: abnormal findings:  obese and Intact wound with Jackson-Pratt drains in place and effectively draining pink fluid nonpurulent. Abdominal binder in place Extremities: Homans sign is negative, no sign of DVT  Disposition: 01-Home or Self Care  Patient Instructions:  Medication List  As of 08/16/2011  8:28 AM   ASK your doctor about these medications         ALPRAZolam 0.5 MG tablet   Commonly known as: XANAX   Take 0.5 mg by mouth 4 (four) times daily as needed. For anxiety and sleep      dicyclomine 20 MG tablet   Commonly known as: BENTYL   Take 1 tablet (20 mg total)  by mouth 3 (three) times daily as needed. For diarrhea and cramping       diphenhydrAMINE 12.5 MG chewable tablet   Commonly known as: BENADRYL   Chew 37.5 mg by mouth every 6 (six) hours as needed. For sinus irritation      escitalopram 20 MG tablet   Commonly known as: LEXAPRO   Take 20 mg by mouth every morning.      HYDROcodone-acetaminophen 5-500 MG per tablet   Commonly known as: VICODIN   Take 1 tablet by mouth every 4 (four) hours as needed. For pain      levothyroxine 150 MCG tablet   Commonly known as: SYNTHROID, LEVOTHROID   Take 150 mcg by mouth daily before breakfast.      olmesartan-hydrochlorothiazide 20-12.5 MG per tablet   Commonly known as: BENICAR HCT   Take 1 tablet by mouth every morning.      promethazine 25 MG tablet   Commonly known as: PHENERGAN   Take 25 mg by mouth every 6 (six) hours as needed. For nausea           Activity: no driving for 2 weeks, no driving for 3 weeks and no sex for 6 weeks Diet: regular diet Wound Care: reinforce dressing PRN and Daily and 2 Jackson-Pratt drains, use abdominal binder when out of bed  Follow-up with Dulcemaria Bula in 5 day.  SignedTilda Burrow 08/16/2011 8:28 AM

## 2011-08-18 ENCOUNTER — Encounter (HOSPITAL_COMMUNITY): Payer: Self-pay | Admitting: Obstetrics and Gynecology

## 2011-10-22 ENCOUNTER — Other Ambulatory Visit (HOSPITAL_COMMUNITY): Payer: Self-pay | Admitting: Family Medicine

## 2011-10-22 ENCOUNTER — Ambulatory Visit (HOSPITAL_COMMUNITY)
Admission: RE | Admit: 2011-10-22 | Discharge: 2011-10-22 | Disposition: A | Discharge status: Home / Self Care | Source: Ambulatory Visit | Attending: Family Medicine | Admitting: Family Medicine

## 2011-10-22 DIAGNOSIS — M545 Low back pain, unspecified: Secondary | ICD-10-CM | POA: Insufficient documentation

## 2011-10-26 ENCOUNTER — Ambulatory Visit (HOSPITAL_COMMUNITY)
Admission: RE | Admit: 2011-10-26 | Discharge: 2011-10-26 | Disposition: A | Discharge status: Home / Self Care | Source: Ambulatory Visit | Attending: Family Medicine | Admitting: Family Medicine

## 2011-10-26 DIAGNOSIS — M545 Low back pain, unspecified: Secondary | ICD-10-CM | POA: Insufficient documentation

## 2011-10-26 DIAGNOSIS — M25559 Pain in unspecified hip: Secondary | ICD-10-CM | POA: Insufficient documentation

## 2011-10-26 DIAGNOSIS — M5126 Other intervertebral disc displacement, lumbar region: Secondary | ICD-10-CM | POA: Insufficient documentation

## 2015-06-19 ENCOUNTER — Emergency Department (HOSPITAL_COMMUNITY): Payer: Worker's Compensation

## 2015-06-19 ENCOUNTER — Emergency Department (HOSPITAL_COMMUNITY)
Admission: EM | Admit: 2015-06-19 | Discharge: 2015-06-19 | Disposition: A | Discharge status: Home / Self Care | Payer: Worker's Compensation | Attending: Emergency Medicine | Admitting: Emergency Medicine

## 2015-06-19 ENCOUNTER — Encounter (HOSPITAL_COMMUNITY): Payer: Self-pay

## 2015-06-19 DIAGNOSIS — S60211A Contusion of right wrist, initial encounter: Secondary | ICD-10-CM | POA: Diagnosis not present

## 2015-06-19 DIAGNOSIS — I10 Essential (primary) hypertension: Secondary | ICD-10-CM | POA: Insufficient documentation

## 2015-06-19 DIAGNOSIS — Z79899 Other long term (current) drug therapy: Secondary | ICD-10-CM | POA: Insufficient documentation

## 2015-06-19 DIAGNOSIS — S60212A Contusion of left wrist, initial encounter: Secondary | ICD-10-CM | POA: Insufficient documentation

## 2015-06-19 DIAGNOSIS — E039 Hypothyroidism, unspecified: Secondary | ICD-10-CM | POA: Diagnosis not present

## 2015-06-19 DIAGNOSIS — Y939 Activity, unspecified: Secondary | ICD-10-CM | POA: Insufficient documentation

## 2015-06-19 DIAGNOSIS — S8001XA Contusion of right knee, initial encounter: Secondary | ICD-10-CM | POA: Diagnosis not present

## 2015-06-19 DIAGNOSIS — W109XXA Fall (on) (from) unspecified stairs and steps, initial encounter: Secondary | ICD-10-CM | POA: Insufficient documentation

## 2015-06-19 DIAGNOSIS — S63502A Unspecified sprain of left wrist, initial encounter: Secondary | ICD-10-CM | POA: Diagnosis not present

## 2015-06-19 DIAGNOSIS — F329 Major depressive disorder, single episode, unspecified: Secondary | ICD-10-CM | POA: Diagnosis not present

## 2015-06-19 DIAGNOSIS — T07XXXA Unspecified multiple injuries, initial encounter: Secondary | ICD-10-CM

## 2015-06-19 DIAGNOSIS — S8002XA Contusion of left knee, initial encounter: Secondary | ICD-10-CM | POA: Diagnosis not present

## 2015-06-19 DIAGNOSIS — Y9229 Other specified public building as the place of occurrence of the external cause: Secondary | ICD-10-CM | POA: Insufficient documentation

## 2015-06-19 DIAGNOSIS — Y999 Unspecified external cause status: Secondary | ICD-10-CM | POA: Insufficient documentation

## 2015-06-19 DIAGNOSIS — S5001XA Contusion of right elbow, initial encounter: Secondary | ICD-10-CM | POA: Insufficient documentation

## 2015-06-19 DIAGNOSIS — S6992XA Unspecified injury of left wrist, hand and finger(s), initial encounter: Secondary | ICD-10-CM | POA: Diagnosis present

## 2015-06-19 MED ORDER — OXYCODONE-ACETAMINOPHEN 5-325 MG PO TABS: 2.0000 | ORAL_TABLET | ORAL | Status: DC | PRN

## 2015-06-19 MED ORDER — HYDROCODONE-ACETAMINOPHEN 5-325 MG PO TABS: 2.0000 | ORAL_TABLET | Freq: Once | ORAL | Status: DC

## 2015-06-19 NOTE — Discharge Instructions (Signed)
Contusion A contusion is a deep bruise. Contusions are the result of a blunt injury to tissues and muscle fibers under the skin. The injury causes bleeding under the skin. The skin overlying the contusion may turn blue, purple, or yellow. Minor injuries will give you a painless contusion, but more severe contusions may stay painful and swollen for a few weeks.  CAUSES  This condition is usually caused by a blow, trauma, or direct force to an area of the body. SYMPTOMS  Symptoms of this condition include:  Swelling of the injured area.  Pain and tenderness in the injured area.  Discoloration. The area may have redness and then turn blue, purple, or yellow. DIAGNOSIS  This condition is diagnosed based on a physical exam and medical history. An X-ray, CT scan, or MRI may be needed to determine if there are any associated injuries, such as broken bones (fractures). TREATMENT  Specific treatment for this condition depends on what area of the body was injured. In general, the best treatment for a contusion is resting, icing, applying pressure to (compression), and elevating the injured area. This is often called the RICE strategy. Over-the-counter anti-inflammatory medicines may also be recommended for pain control.  HOME CARE INSTRUCTIONS   Rest the injured area.  If directed, apply ice to the injured area:  Put ice in a plastic bag.  Place a towel between your skin and the bag.  Leave the ice on for 20 minutes, 2-3 times per day.  If directed, apply light compression to the injured area using an elastic bandage. Make sure the bandage is not wrapped too tightly. Remove and reapply the bandage as directed by your health care provider.  If possible, raise (elevate) the injured area above the level of your heart while you are sitting or lying down.  Take over-the-counter and prescription medicines only as told by your health care provider. SEEK MEDICAL CARE IF:  Your symptoms do not  improve after several days of treatment.  Your symptoms get worse.  You have difficulty moving the injured area. SEEK IMMEDIATE MEDICAL CARE IF:   You have severe pain.  You have numbness in a hand or foot.  Your hand or foot turns pale or cold.   This information is not intended to replace advice given to you by your health care provider. Make sure you discuss any questions you have with your health care provider.   Document Released: 11/05/2004 Document Revised: 10/17/2014 Document Reviewed: 06/13/2014 Elsevier Interactive Patient Education 2016 [employer] Wrist Pain There are many things that can cause wrist pain. Some common causes include:  An injury to the wrist area, such as a sprain, strain, or fracture.  Overuse of the joint.  A condition that causes increased pressure on a nerve in the wrist (carpal tunnel syndrome).  Wear and tear of the joints that occurs with aging (osteoarthritis).  A variety of other types of arthritis. Sometimes, the cause of wrist pain is not known. The pain often goes away when you follow your health care provider's instructions for relieving pain at home. If your wrist pain continues, tests may need to be done to diagnose your condition. HOME CARE INSTRUCTIONS Pay attention to any changes in your symptoms. Take these actions to help with your pain:  Rest the wrist area for at least 48 hours or as told by your health care provider.  If directed, apply ice to the injured area:  Put ice in a plastic bag.  Place a  towel between your skin and the bag.  Leave the ice on for 20 minutes, 2-3 times per day.  Keep your arm raised (elevated) above the level of your heart while you are sitting or lying down.  If a splint or elastic bandage has been applied, use it as told by your health care provider.  Remove the splint or bandage only as told by your health care provider.  Loosen the splint or bandage if your fingers become numb or have a  tingling feeling, or if they turn cold or blue.  Take over-the-counter and prescription medicines only as told by your health care provider.  Keep all follow-up visits as told by your health care provider. This is important. SEEK MEDICAL CARE IF:  Your pain is not helped by treatment.  Your pain gets worse. SEEK IMMEDIATE MEDICAL CARE IF:  Your fingers become swollen.  Your fingers turn white, very red, or cold and blue.  Your fingers are numb or have a tingling feeling.  You have difficulty moving your fingers.   This information is not intended to replace advice given to you by your health care provider. Make sure you discuss any questions you have with your health care provider.   Document Released: 11/05/2004 Document Revised: 10/17/2014 Document Reviewed: 06/13/2014 Elsevier Interactive Patient Education [employer]

## 2015-06-19 NOTE — ED Notes (Signed)
Patient verbalizes understanding of discharge instructions, prescriptions, home care and follow up care. Patient out of department at this time. 

## 2015-06-19 NOTE — ED Notes (Signed)
Pt says missed a step coming out of her building yesterday and c/o pain to both wrists, knees, and neck.  Bruising to both wrists and abrasion to r elbow.

## 2015-06-19 NOTE — ED Provider Notes (Signed)
CSN: [id number]     Arrival date & time 06/19/15  1533 History   First MD Initiated Contact with Patient 06/19/15 1628     Chief Complaint  Patient presents with  . Fall     (Consider location/radiation/quality/duration/timing/severity/associated sxs/prior Treatment) Patient is a 55 y.o. female presenting with fall. The history is provided by the patient. No language interpreter was used.  Fall This is a new problem. The current episode started today. The problem occurs constantly. The problem has been gradually worsening. Associated symptoms include joint swelling and myalgias. Nothing aggravates the symptoms. She has tried nothing for the symptoms. The treatment provided moderate relief.  Pt fell yesterday at work.  Pt hit both knees, both wrist and right elbow.    Past Medical History  Diagnosis Date  . Hypertension   . Hypothyroidism   . IBS (irritable bowel syndrome)   . Depression     started Lexapro when husband passed away  . PONV (postoperative nausea and vomiting)    Past Surgical History  Procedure Laterality Date  . Foot surgery      x 5 right  . Hand surgery      x2 right  . Cesarean section      x 2  . Esophagogastroduodenoscopy  Jan 2004    RMR: normal esophagus, small hiatal hernia, normal D1, D2  . Cholecystectomy  2009    [facility]  . Abdominal hysterectomy  08/11/2011    Procedure: HYSTERECTOMY ABDOMINAL;  Surgeon: Tilda BurrowJohn V Ferguson, MD;  Location: [facility];  Service: Gynecology;  Laterality: N/A;  . Scar revision  08/11/2011    Procedure: SCAR REVISION;  Surgeon: Tilda BurrowJohn V Ferguson, MD;  Location: [facility];  Service: Gynecology;  Laterality: N/A;  Excision of Wide Cicatrix  . Salpingoophorectomy  08/11/2011    Procedure: SALPINGO OOPHERECTOMY;  Surgeon: Tilda BurrowJohn V Ferguson, MD;  Location: [facility];  Service: Gynecology;  Laterality: Bilateral;  . Hematoma evacuation  08/14/2011    Procedure: EVACUATION HEMATOMA;  Surgeon: Tilda BurrowJohn V Ferguson, MD;  Location: [facility];  Service: Gynecology;   Laterality: N/A;  . Gastric bypass     Family History  Problem Relation Age of Onset  . Colon cancer Neg Hx    Social History  Substance Use Topics  . Smoking status: Never Smoker   . Smokeless tobacco: None  . Alcohol Use: No   OB History    No data available     Review of Systems  Musculoskeletal: Positive for myalgias and joint swelling.  Skin: Positive for wound.  All other systems reviewed and are negative.     Allergies  Bee pollen; Chocolate; Nsaids; Synthroid; Latex; and Neosporin  Home Medications   Prior to Admission medications   Medication Sig Start Date End Date Taking? Authorizing Provider  ALPRAZolam Prudy Feeler(XANAX) 0.5 MG tablet Take 0.5 mg by mouth 4 (four) times daily as needed. For anxiety and sleep    Historical Provider, MD  dicyclomine (BENTYL) 20 MG tablet Take 1 tablet (20 mg total) by mouth 3 (three) times daily as needed. For diarrhea and cramping 06/09/11 06/08/12  Nira RetortAnna W Sams, NP  diphenhydrAMINE (BENADRYL) 12.5 MG chewable tablet Chew 37.5 mg by mouth every 6 (six) hours as needed. For sinus irritation    Historical Provider, MD  escitalopram (LEXAPRO) 20 MG tablet Take 20 mg by mouth every morning.     Historical Provider, MD  levothyroxine (SYNTHROID, LEVOTHROID) 150 MCG tablet Take 150 mcg by mouth daily before breakfast.  Historical Provider, MD  olmesartan-hydrochlorothiazide (BENICAR HCT) 20-12.5 MG per tablet Take 1 tablet by mouth every morning.    Historical Provider, MD  promethazine (PHENERGAN) 25 MG tablet Take 25 mg by mouth every 6 (six) hours as needed. For nausea    Historical Provider, MD   BP 123/70 mmHg  Pulse 87  Temp(Src) 98.7 F (37.1 C) (Oral)  Resp 20  Ht  (1.778 m)  Wt 72.576 kg  BMI 22.96 kg/m2  SpO2 100%  LMP 07/04/2011 Physical Exam  Constitutional: She is oriented to person, place, and time. She appears well-developed and well-nourished.  Musculoskeletal: She exhibits tenderness.  Swollen bruised bilat  wrist,  Bruised bilat knees  Bruised right elbow  From nv and ns intact  Neurological: She is alert and oriented to person, place, and time. She has normal reflexes.  Skin:  Bruised wrists, bruised knees  Bruised elbows,    Psychiatric: She has a normal mood and affect.  Nursing note and vitals reviewed.   ED Course  Procedures (including critical care time) Labs Review Labs Reviewed - No data to display  Imaging Review Dg Wrist Complete Left  06/19/2015  CLINICAL DATA:  Fall yesterday at work with left wrist pain. Initial encounter. EXAM: LEFT WRIST - COMPLETE 3+ VIEW COMPARISON:  None. FINDINGS: There is no evidence of fracture or dislocation. No acute soft tissue finding. Osteopenic appearance. IMPRESSION: Negative for fracture or dislocation. Electronically Signed   By: Marnee Spring M.D.   On: 06/19/2015 16:29   I have personally reviewed and evaluated these images and lab results as part of my medical decision-making.   EKG Interpretation None      MDM Pt placed in a wrist splint Rx for  Percocet Follow up with Dr. Melvyn Novas for recheck in 1 week.    Final diagnoses:  Multiple contusions  Sprain of left wrist, initial encounter     Meds ordered this encounter  Medications  . DISCONTD: HYDROcodone-acetaminophen (NORCO/VICODIN) 5-325 MG per tablet 2 tablet    Sig:   . levothyroxine (SYNTHROID, LEVOTHROID) 125 MCG tablet    Sig: Take 125 mcg by mouth daily before breakfast.   . diphenhydrAMINE (BENADRYL) 25 mg capsule    Sig: Take 25 mg by mouth 3 (three) times daily as needed for itching or allergies.  . Multiple Vitamin (MULTIVITAMIN WITH MINERALS) TABS tablet    Sig: Take 2 tablets by mouth daily.  . Cyanocobalamin (B-12 PO)    Sig: Take 1 tablet by mouth daily.  Marland Kitchen thiamine (VITAMIN B-1) 100 MG tablet    Sig: Take 100 mg by mouth daily.  Marland Kitchen CALCIUM PO    Sig: Take 1 tablet by mouth daily.  Marland Kitchen BIOTIN PO    Sig: Take 1 tablet by mouth daily.  Marland Kitchen CRANBERRY FRUIT PO     Sig: Take 1 tablet by mouth daily.  Marland Kitchen oxyCODONE-acetaminophen (PERCOCET/ROXICET) 5-325 MG tablet    Sig: Take 2 tablets by mouth every 4 (four) hours as needed for severe pain.    Dispense:  10 tablet    Refill:  0    Order Specific Question:  Supervising Provider    Answer:  Eber Hong [[id number]]     Lonia Skinner [place], PA-C 06/19/15 1727

## 2015-08-28 NOTE — ED Provider Notes (Signed)
Medical screening examination/treatment/procedure(s) were performed by non-physician practitioner and as supervising physician I was immediately available for consultation/collaboration.   EKG Interpretation None       Jacalyn LefevreJulie Gwendolen Hewlett, MD 08/28/15 [phone]

## 2017-06-13 IMAGING — DX DG WRIST COMPLETE 3+V*L*
4 series · 4 of 4 positions shown · non-contrast
Comparison: None.

CLINICAL DATA: Fall yesterday at work with left wrist pain. Initial
encounter.

EXAM:
LEFT WRIST - COMPLETE 3+ VIEW

[wrist pa (1 of 2)]
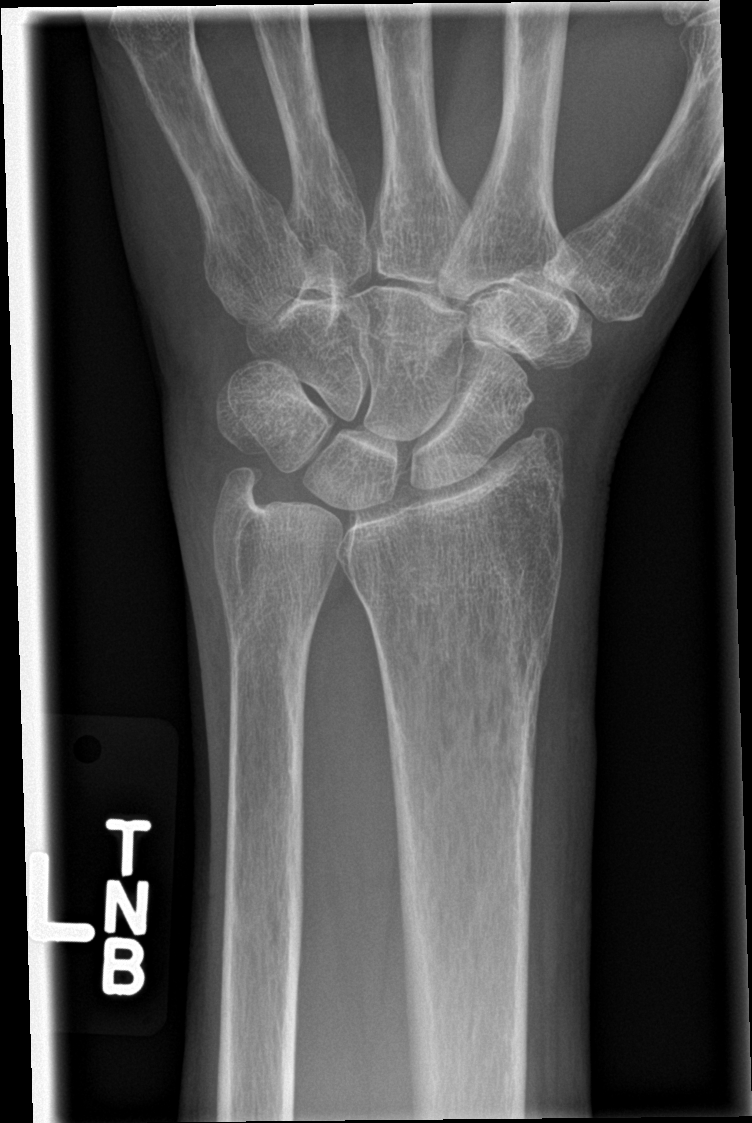

[wrist obl]
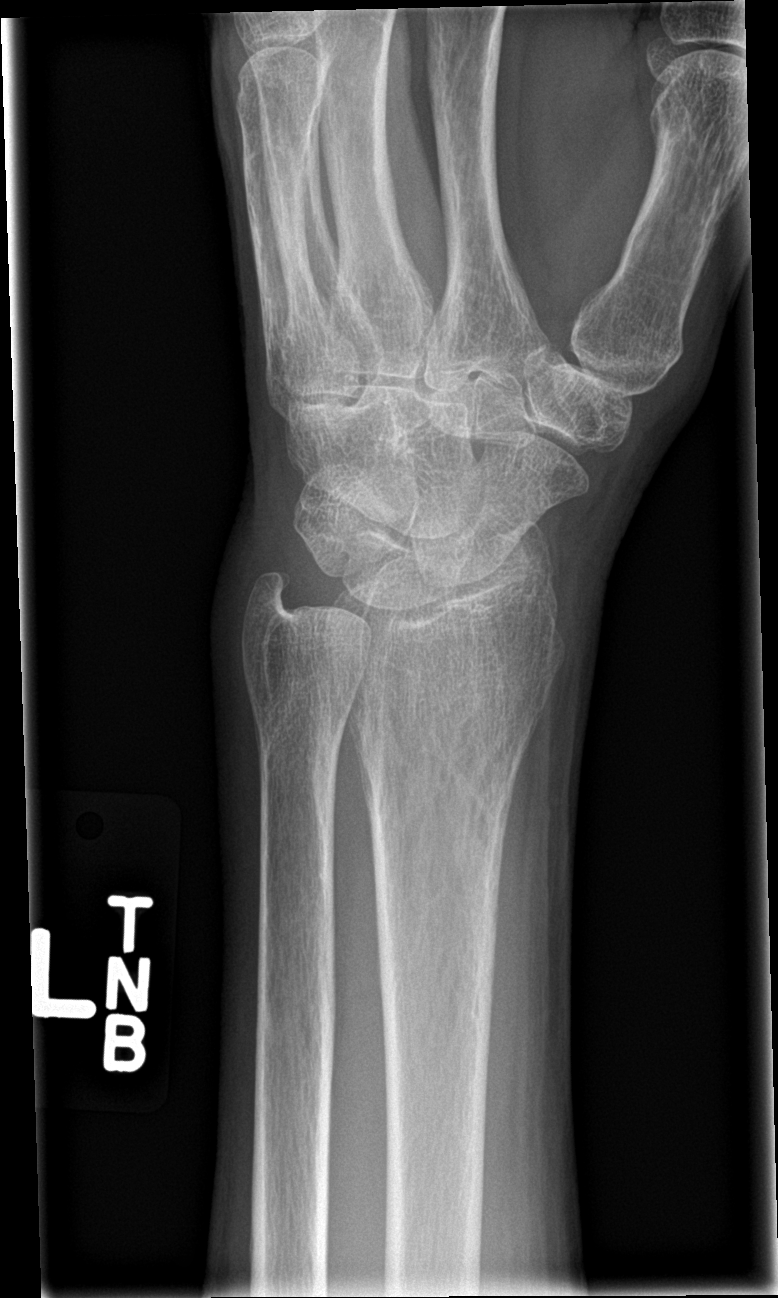

[wrist lat]
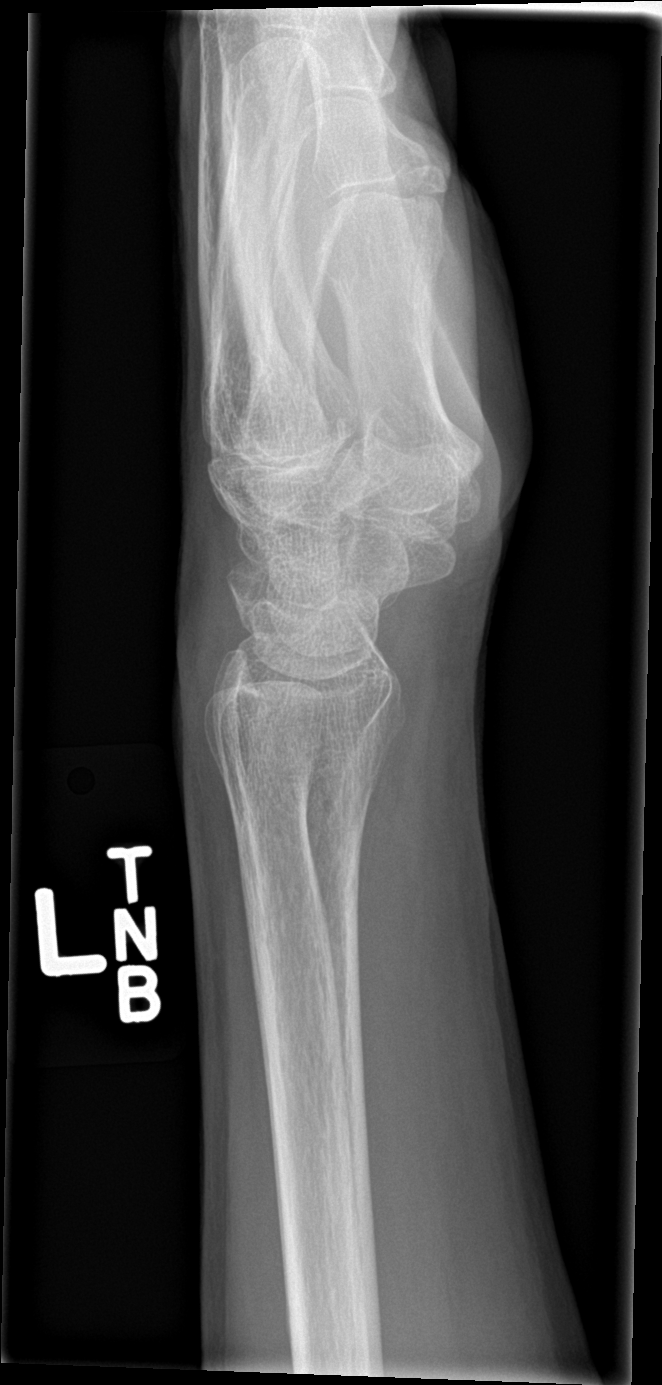

[wrist pa (2 of 2)]
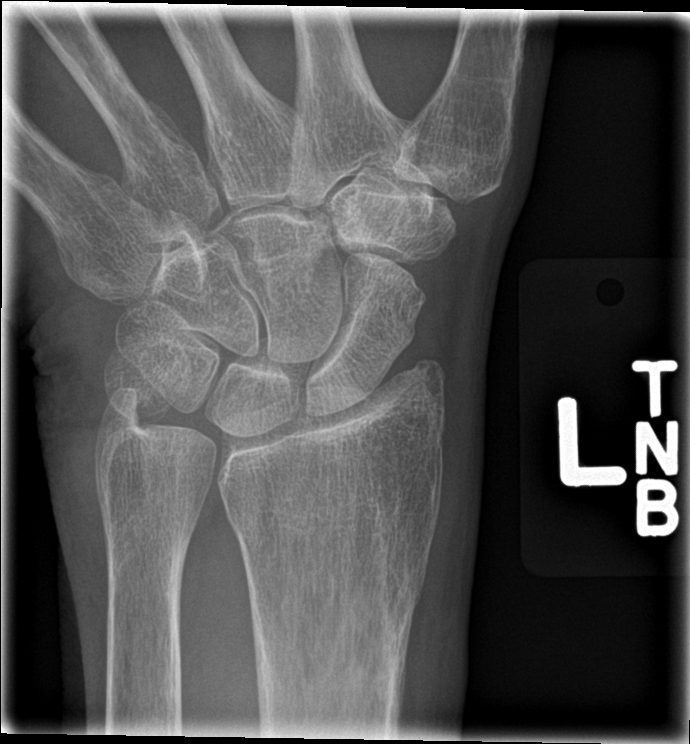

[4 of 4 positions shown; findings below may reference images not displayed]

FINDINGS: There is no evidence of fracture or dislocation. No acute soft
tissue finding. Osteopenic appearance.
IMPRESSION: Negative for fracture or dislocation.

## 2017-08-09 ENCOUNTER — Emergency Department (HOSPITAL_COMMUNITY)
Admission: EM | Admit: 2017-08-09 | Discharge: 2017-08-09 | Disposition: A | Discharge status: Home / Self Care | Attending: Emergency Medicine | Admitting: Emergency Medicine

## 2017-08-09 ENCOUNTER — Other Ambulatory Visit: Payer: Self-pay

## 2017-08-09 ENCOUNTER — Encounter (HOSPITAL_COMMUNITY): Payer: Self-pay | Admitting: Emergency Medicine

## 2017-08-09 DIAGNOSIS — I10 Essential (primary) hypertension: Secondary | ICD-10-CM | POA: Insufficient documentation

## 2017-08-09 DIAGNOSIS — L255 Unspecified contact dermatitis due to plants, except food: Secondary | ICD-10-CM | POA: Diagnosis not present

## 2017-08-09 DIAGNOSIS — R21 Rash and other nonspecific skin eruption: Secondary | ICD-10-CM | POA: Diagnosis present

## 2017-08-09 DIAGNOSIS — Z91040 Latex allergy status: Secondary | ICD-10-CM | POA: Insufficient documentation

## 2017-08-09 DIAGNOSIS — E039 Hypothyroidism, unspecified: Secondary | ICD-10-CM | POA: Diagnosis not present

## 2017-08-09 DIAGNOSIS — Z79899 Other long term (current) drug therapy: Secondary | ICD-10-CM | POA: Insufficient documentation

## 2017-08-09 MED ORDER — HYDROXYZINE HCL 25 MG PO TABS: 25.0000 mg | ORAL_TABLET | Freq: Four times a day (QID) | ORAL | 0 refills | Status: DC

## 2017-08-09 MED ORDER — DEXAMETHASONE SODIUM PHOSPHATE 4 MG/ML IJ SOLN
10.0000 mg | Freq: Once | INTRAMUSCULAR | Status: AC
  Administered 2017-08-09: 10 mg via INTRAMUSCULAR
  Filled 2017-08-09: qty 3

## 2017-08-09 MED ORDER — PREDNISONE 10 MG PO TABS: ORAL_TABLET | ORAL | 0 refills | Status: DC

## 2017-08-09 NOTE — ED Provider Notes (Signed)
[facility] EMERGENCY DEPARTMENT Provider Note   CSN: [id number] Arrival date & time: 08/09/17  1551     History   Chief Complaint Chief Complaint  Patient presents with  . Rash    HPI JESSIA KIEF is a 57 y.o. female.  HPI   ALAYJAH BOEHRINGER is a 57 y.o. female who presents to the Emergency Department complaining of itching and rash for 2 days.  She states that she noticed a red rash to her legs that began after she was outside pulling some weeds.  She states since that time, she has noticed the rash has spread to her arms chest back and abdomen.  She reports severe itching that is worse with exposure to heat.  She is tried multiple over-the-counter remedies including Benadryl without relief.  She denies drainage, blisters, swelling, shortness of breath or difficulty swallowing or facial pain or swelling.     Past Medical History:  Diagnosis Date  . Depression    started Lexapro when husband passed away  . Hypertension   . Hypothyroidism   . IBS (irritable bowel syndrome)   . PONV (postoperative nausea and vomiting)     Patient Active Problem List   Diagnosis Date Noted  . IBS (irritable bowel syndrome) 06/09/2011    Past Surgical History:  Procedure Laterality Date  . ABDOMINAL HYSTERECTOMY  08/11/2011   Procedure: HYSTERECTOMY ABDOMINAL;  Surgeon: [name], MD;  Location: [facility];  Service: Gynecology;  Laterality: N/A;  . CESAREAN SECTION     x 2  . CHOLECYSTECTOMY  2009   [facility]  . ESOPHAGOGASTRODUODENOSCOPY  Jan 2004   RMR: normal esophagus, small hiatal hernia, normal D1, D2  . FOOT SURGERY     x 5 right  . GASTRIC BYPASS    . HAND SURGERY     x2 right  . HEMATOMA EVACUATION  08/14/2011   Procedure: EVACUATION HEMATOMA;  Surgeon: [name], MD;  Location: [facility];  Service: Gynecology;  Laterality: N/A;  . SALPINGOOPHORECTOMY  08/11/2011   Procedure: SALPINGO OOPHERECTOMY;  Surgeon: [name], MD;  Location: [facility];  Service: Gynecology;   Laterality: Bilateral;  . SCAR REVISION  08/11/2011   Procedure: SCAR REVISION;  Surgeon: [name], MD;  Location: [facility];  Service: Gynecology;  Laterality: N/A;  Excision of Wide Cicatrix     OB History   None      Home Medications    Prior to Admission medications   Medication Sig Start Date End Date Taking? Authorizing Provider  ALPRAZolam Prudy Feeler) 0.5 MG tablet Take 0.5 mg by mouth at bedtime. *May take up to 4 times daily as needed For anxiety and sleep    [provider]  BIOTIN PO Take 1 tablet by mouth daily.    [provider]  CALCIUM PO Take 1 tablet by mouth daily.    [provider]  CRANBERRY FRUIT PO Take 1 tablet by mouth daily.    [provider]  Cyanocobalamin (B-12 PO) Take 1 tablet by mouth daily.    [provider]  dicyclomine (BENTYL) 20 MG tablet Take 1 tablet (20 mg total) by mouth 3 (three) times daily as needed. For diarrhea and cramping 06/09/11 06/19/15  Gelene Mink, NP  diphenhydrAMINE (BENADRYL) 25 mg capsule Take 25 mg by mouth 3 (three) times daily as needed for itching or allergies.    [provider]  hydrOXYzine (ATARAX/VISTARIL) 25 MG tablet Take 1 tablet (25 mg  total) by mouth every 6 (six) hours. May cause drowiness 08/09/17   Janaisa Birkland, PA-C  levothyroxine (SYNTHROID, LEVOTHROID) 125 MCG tablet Take 125 mcg by mouth daily before breakfast.  06/11/15   [provider]  Multiple Vitamin (MULTIVITAMIN WITH MINERALS) TABS tablet Take 2 tablets by mouth daily.    [provider]  oxyCODONE-acetaminophen (PERCOCET/ROXICET) 5-325 MG tablet Take 2 tablets by mouth every 4 (four) hours as needed for severe pain. 06/19/15   Elson AreasSofia, Leslie K, PA-C  predniSONE (DELTASONE) 10 MG tablet Take 6 tablets day one, 5 tablets day two, 4 tablets day three, 3 tablets day four, 2 tablets day five, then 1 tablet day six 08/09/17   Miroslav Gin, PA-C  promethazine (PHENERGAN) 25 MG tablet Take 25  mg by mouth every 6 (six) hours as needed. For nausea    [provider]  thiamine (VITAMIN B-1) 100 MG tablet Take 100 mg by mouth daily.    [provider]    Family History Family History  Problem Relation Age of Onset  . Colon cancer Neg Hx     Social History Social History   Tobacco Use  . Smoking status: Never Smoker  . Smokeless tobacco: Never Used  Substance Use Topics  . Alcohol use: No  . Drug use: No     Allergies   Bee pollen; Hydrocodone-acetaminophen; Chocolate; Nsaids; Synthroid [levothyroxine sodium]; Latex; and Neosporin [neomycin-bacitracin zn-polymyx]   Review of Systems Review of Systems  Constitutional: Negative for chills and fever.  HENT: Negative for facial swelling, sore throat and trouble swallowing.   Respiratory: Negative for chest tightness, shortness of breath and wheezing.   Musculoskeletal: Negative for arthralgias, neck pain and neck stiffness.  Skin: Positive for rash. Negative for wound.  Neurological: Negative for weakness, numbness and headaches.  All other systems reviewed and are negative.    Physical Exam Updated Vital Signs BP (!) 155/98 (BP Location: Right Arm)   Pulse (!) 108   Temp 97.9 F (36.6 C) (Oral)   Resp 20   Ht 5\' 10"  (1.778 m)   Wt 72.6 kg (160 lb)   LMP 08/14/2011   SpO2 100%   BMI 22.96 kg/m   Physical Exam  Constitutional: She appears well-developed and well-nourished. No distress.  HENT:  Head: Normocephalic and atraumatic.  Mouth/Throat: Uvula is midline, oropharynx is clear and moist and mucous membranes are normal. No oral lesions. No uvula swelling.  Neck: Normal range of motion. Neck supple.  Cardiovascular: Normal rate, regular rhythm and intact distal pulses.  No murmur heard. Pulmonary/Chest: Effort normal and breath sounds normal. No respiratory distress.  Musculoskeletal: Normal range of motion. She exhibits no edema or tenderness.  Lymphadenopathy:    She has no  cervical adenopathy.  Neurological: She is alert. No sensory deficit. She exhibits normal muscle tone.  Skin: Skin is warm. Capillary refill takes less than 2 seconds. Rash noted. There is erythema.  Scattered, erythematous maculopapular rash to most of the body.  No edema, pustules or vesicles.   Face, feet and hands are spared.  Multiple excoriations  Psychiatric:  Patient is anxious appearing  Nursing note and vitals reviewed.    ED Treatments / Results  Labs (all labs ordered are listed, but only abnormal results are displayed) Labs Reviewed - No data to display  EKG None  Radiology No results found.  Procedures Procedures (including critical care time)  Medications Ordered in ED Medications  dexamethasone (DECADRON) injection 10 mg (has no administration  in time range)     Initial Impression / Assessment and Plan / ED Course  I have reviewed the triage vital signs and the nursing notes.  Pertinent labs & imaging results that were available during my care of the patient were reviewed by me and considered in my medical decision making (see chart for details).     Patient with diffuse rash that appears consistent with plant dermatitis.  Several areas are excoriated but no clinical signs concerning for cellulitis.  Patient agrees to treatment plan with steroids and Vistaril for itching.  Return precautions discussed.  Final Clinical Impressions(s) / ED Diagnoses   Final diagnoses:  Plant dermatitis    ED Discharge Orders        Ordered    predniSONE (DELTASONE) 10 MG tablet     08/09/17 1742    hydrOXYzine (ATARAX/VISTARIL) 25 MG tablet  Every 6 hours     08/09/17 1742       Pauline Aus, PA-C 08/10/17 1618    Margarita Grizzle, MD 08/11/17 1458

## 2017-08-09 NOTE — ED Triage Notes (Signed)
Pt c/o rash/itching 2 day ago. Pt states has tried multiple otc meds with no relief . Pt has marks from scratching hard and open areas from scratching. Pt anxious.

## 2017-08-09 NOTE — Discharge Instructions (Addendum)
Start the prednisone tomorrow.  You may take the vistaril as directed this evening. This medication may cause drowsiness.  Follow-up with your primary provider or return here if not improving in 2-3 days

## 2020-01-10 ENCOUNTER — Emergency Department (HOSPITAL_COMMUNITY)

## 2020-01-10 ENCOUNTER — Encounter (HOSPITAL_COMMUNITY): Payer: Self-pay

## 2020-01-10 ENCOUNTER — Inpatient Hospital Stay (HOSPITAL_COMMUNITY)
Admission: EM | Admit: 2020-01-10 | Discharge: 2020-01-12 | DRG: 812 | Disposition: A | Discharge status: Home / Self Care | Attending: Family Medicine | Admitting: Family Medicine

## 2020-01-10 DIAGNOSIS — Z90710 Acquired absence of both cervix and uterus: Secondary | ICD-10-CM | POA: Diagnosis not present

## 2020-01-10 DIAGNOSIS — Z8711 Personal history of peptic ulcer disease: Secondary | ICD-10-CM | POA: Diagnosis not present

## 2020-01-10 DIAGNOSIS — F32A Depression, unspecified: Secondary | ICD-10-CM | POA: Diagnosis present

## 2020-01-10 DIAGNOSIS — K922 Gastrointestinal hemorrhage, unspecified: Secondary | ICD-10-CM | POA: Diagnosis present

## 2020-01-10 DIAGNOSIS — E039 Hypothyroidism, unspecified: Secondary | ICD-10-CM | POA: Diagnosis present

## 2020-01-10 DIAGNOSIS — K589 Irritable bowel syndrome without diarrhea: Secondary | ICD-10-CM | POA: Diagnosis present

## 2020-01-10 DIAGNOSIS — Z886 Allergy status to analgesic agent status: Secondary | ICD-10-CM | POA: Diagnosis not present

## 2020-01-10 DIAGNOSIS — I10 Essential (primary) hypertension: Secondary | ICD-10-CM | POA: Diagnosis present

## 2020-01-10 DIAGNOSIS — R195 Other fecal abnormalities: Secondary | ICD-10-CM | POA: Diagnosis present

## 2020-01-10 DIAGNOSIS — D649 Anemia, unspecified: Secondary | ICD-10-CM | POA: Diagnosis present

## 2020-01-10 DIAGNOSIS — I471 Supraventricular tachycardia: Secondary | ICD-10-CM | POA: Diagnosis present

## 2020-01-10 DIAGNOSIS — Z79899 Other long term (current) drug therapy: Secondary | ICD-10-CM | POA: Diagnosis not present

## 2020-01-10 DIAGNOSIS — Z79890 Hormone replacement therapy (postmenopausal): Secondary | ICD-10-CM

## 2020-01-10 DIAGNOSIS — Z885 Allergy status to narcotic agent status: Secondary | ICD-10-CM | POA: Diagnosis not present

## 2020-01-10 DIAGNOSIS — K648 Other hemorrhoids: Secondary | ICD-10-CM | POA: Diagnosis present

## 2020-01-10 DIAGNOSIS — Z91018 Allergy to other foods: Secondary | ICD-10-CM | POA: Diagnosis not present

## 2020-01-10 DIAGNOSIS — Z9884 Bariatric surgery status: Secondary | ICD-10-CM

## 2020-01-10 DIAGNOSIS — Z20822 Contact with and (suspected) exposure to covid-19: Secondary | ICD-10-CM | POA: Diagnosis present

## 2020-01-10 DIAGNOSIS — K921 Melena: Secondary | ICD-10-CM | POA: Diagnosis present

## 2020-01-10 DIAGNOSIS — Z91040 Latex allergy status: Secondary | ICD-10-CM

## 2020-01-10 DIAGNOSIS — E559 Vitamin D deficiency, unspecified: Secondary | ICD-10-CM | POA: Diagnosis present

## 2020-01-10 DIAGNOSIS — Z9049 Acquired absence of other specified parts of digestive tract: Secondary | ICD-10-CM | POA: Diagnosis not present

## 2020-01-10 DIAGNOSIS — D509 Iron deficiency anemia, unspecified: Principal | ICD-10-CM | POA: Diagnosis present

## 2020-01-10 DIAGNOSIS — K259 Gastric ulcer, unspecified as acute or chronic, without hemorrhage or perforation: Secondary | ICD-10-CM

## 2020-01-10 DIAGNOSIS — K5730 Diverticulosis of large intestine without perforation or abscess without bleeding: Secondary | ICD-10-CM | POA: Diagnosis present

## 2020-01-10 DIAGNOSIS — E569 Vitamin deficiency, unspecified: Secondary | ICD-10-CM | POA: Diagnosis present

## 2020-01-10 DIAGNOSIS — E538 Deficiency of other specified B group vitamins: Secondary | ICD-10-CM | POA: Diagnosis present

## 2020-01-10 DIAGNOSIS — K317 Polyp of stomach and duodenum: Secondary | ICD-10-CM | POA: Diagnosis present

## 2020-01-10 DIAGNOSIS — R000 Tachycardia, unspecified: Secondary | ICD-10-CM | POA: Diagnosis not present

## 2020-01-10 DIAGNOSIS — R55 Syncope and collapse: Secondary | ICD-10-CM | POA: Diagnosis present

## 2020-01-10 DIAGNOSIS — F411 Generalized anxiety disorder: Secondary | ICD-10-CM | POA: Diagnosis present

## 2020-01-10 DIAGNOSIS — Z91030 Bee allergy status: Secondary | ICD-10-CM

## 2020-01-10 DIAGNOSIS — Z90721 Acquired absence of ovaries, unilateral: Secondary | ICD-10-CM | POA: Diagnosis not present

## 2020-01-10 DIAGNOSIS — R945 Abnormal results of liver function studies: Secondary | ICD-10-CM

## 2020-01-10 DIAGNOSIS — R7989 Other specified abnormal findings of blood chemistry: Secondary | ICD-10-CM

## 2020-01-10 HISTORY — DX: Gastric ulcer, unspecified as acute or chronic, without hemorrhage or perforation: K25.9

## 2020-01-10 LAB — COMPREHENSIVE METABOLIC PANEL
ALT: 45 U/L — ABNORMAL HIGH (ref 0–44)
AST: 59 U/L — ABNORMAL HIGH (ref 15–41)
Albumin: 2.1 g/dL — ABNORMAL LOW (ref 3.5–5.0)
Alkaline Phosphatase: 188 U/L — ABNORMAL HIGH (ref 38–126)
Anion gap: 5 (ref 5–15)
BUN: 18 mg/dL (ref 6–20)
CO2: 23 mmol/L (ref 22–32)
Calcium: 7.7 mg/dL — ABNORMAL LOW (ref 8.9–10.3)
Chloride: 109 mmol/L (ref 98–111)
Creatinine, Ser: 0.67 mg/dL (ref 0.44–1.00)
GFR, Estimated: >60 mL/min (ref >60)
Glucose, Bld: 121 mg/dL — ABNORMAL HIGH (ref 70–99)
Potassium: 4.3 mmol/L (ref 3.5–5.1)
Sodium: 137 mmol/L (ref 135–145)
Total Bilirubin: 0.2 mg/dL — ABNORMAL LOW (ref 0.3–1.2)
Total Protein: 4.6 g/dL — ABNORMAL LOW (ref 6.5–8.1)

## 2020-01-10 LAB — VITAMIN B12: Vitamin B-12: 135 pg/mL — ABNORMAL LOW (ref 180–914)

## 2020-01-10 LAB — CBC WITH DIFFERENTIAL/PLATELET
Abs Immature Granulocytes: 0.02 10*3/uL (ref 0.00–0.07)
Basophils Absolute: 0 10*3/uL (ref 0.0–0.1)
Basophils Relative: 0 %
Eosinophils Absolute: 0 10*3/uL (ref 0.0–0.5)
Eosinophils Relative: 0 %
HCT: 21.9 % — ABNORMAL LOW (ref 36.0–46.0)
Hemoglobin: 6 g/dL — CL (ref 12.0–15.0)
Immature Granulocytes: 1 %
Lymphocytes Relative: 14 %
Lymphs Abs: 0.6 10*3/uL — ABNORMAL LOW (ref 0.7–4.0)
MCH: 21.7 pg — ABNORMAL LOW (ref 26.0–34.0)
MCHC: 27.4 g/dL — ABNORMAL LOW (ref 30.0–36.0)
MCV: 79.1 fL — ABNORMAL LOW (ref 80.0–100.0)
Monocytes Absolute: 0.3 10*3/uL (ref 0.1–1.0)
Monocytes Relative: 8 %
Neutro Abs: 3.4 10*3/uL (ref 1.7–7.7)
Neutrophils Relative %: 77 %
Platelets: 237 10*3/uL (ref 150–400)
RBC: 2.77 MIL/uL — ABNORMAL LOW (ref 3.87–5.11)
RDW: 16.7 % — ABNORMAL HIGH (ref 11.5–15.5)
WBC: 4.3 10*3/uL (ref 4.0–10.5)
nRBC: 0 % (ref 0.0–0.2)

## 2020-01-10 LAB — RETICULOCYTES
Immature Retic Fract: 30.1 % — ABNORMAL HIGH (ref 2.3–15.9)
RBC.: 2.82 MIL/uL — ABNORMAL LOW (ref 3.87–5.11)
Retic Count, Absolute: 45.7 10*3/uL (ref 19.0–186.0)
Retic Ct Pct: 1.6 % (ref 0.4–3.1)

## 2020-01-10 LAB — TSH: TSH: 7.309 u[IU]/mL — ABNORMAL HIGH (ref 0.350–4.500)

## 2020-01-10 LAB — HEMOGLOBIN A1C
Hgb A1c MFr Bld: 4.9 % (ref 4.8–5.6)
Mean Plasma Glucose: 93.93 mg/dL

## 2020-01-10 LAB — IRON AND TIBC
Iron: 11 ug/dL — ABNORMAL LOW (ref 28–170)
Saturation Ratios: 3 % — ABNORMAL LOW (ref 10.4–31.8)
TIBC: 317 ug/dL (ref 250–450)
UIBC: 306 ug/dL

## 2020-01-10 LAB — VITAMIN D 25 HYDROXY (VIT D DEFICIENCY, FRACTURES): Vit D, 25-Hydroxy: 39.07 ng/mL (ref 30–100)

## 2020-01-10 LAB — TROPONIN I (HIGH SENSITIVITY)
Troponin I (High Sensitivity): 9 ng/L (ref <18)
Troponin I (High Sensitivity): 15 ng/L (ref <18)

## 2020-01-10 LAB — FERRITIN: Ferritin: 4 ng/mL — ABNORMAL LOW (ref 11–307)

## 2020-01-10 LAB — PREPARE RBC (CROSSMATCH)

## 2020-01-10 LAB — FOLATE: Folate: 17.4 ng/mL (ref >5.9)

## 2020-01-10 LAB — POC OCCULT BLOOD, ED: Fecal Occult Bld: POSITIVE — AB

## 2020-01-10 LAB — T4, FREE: Free T4: 0.76 ng/dL (ref 0.61–1.12)

## 2020-01-10 MED ORDER — PANTOPRAZOLE SODIUM 40 MG IV SOLR
40.0000 mg | Freq: Once | INTRAVENOUS | Status: AC
  Administered 2020-01-10: 40 mg via INTRAVENOUS
  Filled 2020-01-10: qty 40

## 2020-01-10 MED ORDER — TRAMADOL HCL 50 MG PO TABS
50.0000 mg | ORAL_TABLET | Freq: Four times a day (QID) | ORAL | Status: DC | PRN
  Administered 2020-01-11 – 2020-01-12 (×2): 50 mg via ORAL
  Filled 2020-01-10 (×2): qty 1

## 2020-01-10 MED ORDER — CYANOCOBALAMIN 1000 MCG/ML IJ SOLN
1000.0000 ug | Freq: Once | INTRAMUSCULAR | Status: AC
  Administered 2020-01-10: 1000 ug via INTRAMUSCULAR
  Filled 2020-01-10: qty 1

## 2020-01-10 MED ORDER — METOPROLOL TARTRATE 5 MG/5ML IV SOLN
2.5000 mg | Freq: Four times a day (QID) | INTRAVENOUS | Status: DC
  Administered 2020-01-11 – 2020-01-12 (×6): 2.5 mg via INTRAVENOUS
  Filled 2020-01-10 (×7): qty 5

## 2020-01-10 MED ORDER — PANTOPRAZOLE SODIUM 40 MG IV SOLR: 40.0000 mg | INTRAVENOUS | Status: DC

## 2020-01-10 MED ORDER — VITAMIN B-1 100 MG PO TABS
100.0000 mg | ORAL_TABLET | Freq: Every day | ORAL | Status: DC
  Administered 2020-01-10 – 2020-01-12 (×2): 100 mg via ORAL
  Filled 2020-01-10 (×9): qty 1

## 2020-01-10 MED ORDER — PROMETHAZINE HCL 12.5 MG PO TABS: 25.0000 mg | ORAL_TABLET | Freq: Four times a day (QID) | ORAL | Status: DC | PRN

## 2020-01-10 MED ORDER — METOPROLOL TARTRATE 5 MG/5ML IV SOLN
2.5000 mg | Freq: Once | INTRAVENOUS | Status: AC
  Administered 2020-01-10: 2.5 mg via INTRAVENOUS
  Filled 2020-01-10: qty 5

## 2020-01-10 MED ORDER — ONDANSETRON HCL 4 MG/2ML IJ SOLN: 4.0000 mg | Freq: Four times a day (QID) | INTRAMUSCULAR | Status: DC | PRN

## 2020-01-10 MED ORDER — SODIUM CHLORIDE 0.9 % IV SOLN: 10.0000 mL/h | Freq: Once | INTRAVENOUS | Status: DC

## 2020-01-10 MED ORDER — ADULT MULTIVITAMIN W/MINERALS CH
1.0000 | ORAL_TABLET | Freq: Every day | ORAL | Status: DC
  Administered 2020-01-10 – 2020-01-12 (×2): 1 via ORAL
  Filled 2020-01-10 (×3): qty 1

## 2020-01-10 MED ORDER — ACETAMINOPHEN 325 MG PO TABS: 650.0000 mg | ORAL_TABLET | Freq: Four times a day (QID) | ORAL | Status: DC | PRN

## 2020-01-10 MED ORDER — BISACODYL 5 MG PO TBEC: 5.0000 mg | DELAYED_RELEASE_TABLET | Freq: Every day | ORAL | Status: DC | PRN

## 2020-01-10 MED ORDER — VITAMIN B-12 1000 MCG PO TABS
1000.0000 ug | ORAL_TABLET | Freq: Every day | ORAL | Status: DC
  Filled 2020-01-10 (×3): qty 4

## 2020-01-10 MED ORDER — SODIUM CHLORIDE 0.9 % IV BOLUS
1000.0000 mL | Freq: Once | INTRAVENOUS | Status: AC
  Administered 2020-01-10: 1000 mL via INTRAVENOUS

## 2020-01-10 MED ORDER — ONDANSETRON HCL 4 MG PO TABS: 4.0000 mg | ORAL_TABLET | Freq: Four times a day (QID) | ORAL | Status: DC | PRN

## 2020-01-10 MED ORDER — ACETAMINOPHEN 650 MG RE SUPP: 650.0000 mg | Freq: Four times a day (QID) | RECTAL | Status: DC | PRN

## 2020-01-10 MED ORDER — VITAMIN D (ERGOCALCIFEROL) 1.25 MG (50000 UNIT) PO CAPS
50000.0000 [IU] | ORAL_CAPSULE | ORAL | Status: DC
  Filled 2020-01-10: qty 1

## 2020-01-10 MED ORDER — FERROUS SULFATE 325 (65 FE) MG PO TABS: 325.0000 mg | ORAL_TABLET | Freq: Every day | ORAL | Status: DC

## 2020-01-10 MED ORDER — VITAMIN B-12 1000 MCG PO TABS
1000.0000 ug | ORAL_TABLET | Freq: Every day | ORAL | Status: DC
  Administered 2020-01-12: 1000 ug via ORAL
  Filled 2020-01-10 (×2): qty 1

## 2020-01-10 MED ORDER — DIPHENHYDRAMINE HCL 25 MG PO CAPS: 25.0000 mg | ORAL_CAPSULE | Freq: Three times a day (TID) | ORAL | Status: DC | PRN

## 2020-01-10 MED ORDER — CALCIUM CARBONATE ANTACID 500 MG PO CHEW
1.0000 | CHEWABLE_TABLET | Freq: Every day | ORAL | Status: DC
  Administered 2020-01-12: 200 mg via ORAL
  Filled 2020-01-10 (×2): qty 1

## 2020-01-10 MED ORDER — ALPRAZOLAM 0.5 MG PO TABS: 0.5000 mg | ORAL_TABLET | Freq: Three times a day (TID) | ORAL | Status: DC | PRN

## 2020-01-10 MED ORDER — LEVOTHYROXINE SODIUM 25 MCG PO TABS
125.0000 ug | ORAL_TABLET | Freq: Every day | ORAL | Status: DC
  Filled 2020-01-10: qty 1
  Filled 2020-01-10: qty 3

## 2020-01-10 MED ORDER — BIOTIN 300 MCG PO TABS: 1.0000 | ORAL_TABLET | Freq: Every day | ORAL | Status: DC

## 2020-01-10 NOTE — Consult Note (Signed)
Referring Provider: Standley Dakinslanford Johnson, MD Primary Care Physician:  Assunta FoundGolding, John, MD Primary Gastroenterologist:  Dr. Karilyn Cotaehman  Reason for Consultation:   Iron deficiency anemia.  HPI:   Patient is 59 year old Caucasian female with complicated GI history who presented to emergency room earlier today after having near syncopal episode at home.  She was found to have a hemoglobin of 6 g.  Her stool was guaiac positive and iron studies confirm iron deficiency anemia and B12 level is also low with normal folate levels.  Patient says she has had weak spells for the last 6 months but she thought was low blood glucose as she would drink orange juice and feel better.  She is getting ready to receive 2 units of PRBCs. Patient denies melena or rectal bleeding hematuria or vaginal bleeding.  She generally has 2-3 bowel movements per day which has been close to her baseline.  She says she has a history of IBS.  Patient says she is supposed with multiple small meals but she generally eats 2-1/2 meals per day.  She says she does not have a good appetite.  However she has not lost any weight.  She does experience emesis if she eats spaghetti or fatty foods.  She denies heartburn or dysphagia.  She has a history of iron deficiency anemia and used to be on p.o. iron.  Last year when she developed anastomotic stricture and experience nausea and vomiting she stopped taking iron and has not been taking regularly.  She does not take OTC NSAIDs.  She has noted craving for ice.  GI history is as follows.  She has screening colonoscopy by Dr. Darrick PennaFields in June 2013 revealing few diverticula at descending and sigmoid colon internal hemorrhoids.  [facility].  In July 2020 she was found to have high-grade stricture involving gastrojejunal anastomosis not responding to balloon dilation.  Second balloon dilation was not attempted because she had food debris in her gastric  pouch. She also had colonoscopy in July 2020 without significant findings.  I am not able to review that report.  In September 2020 she had placement of axios stent [facility], This stent was removed in January 2021.       Past Medical History:  Diagnosis Date  . Depression    started Lexapro when husband passed away  . Hypertension   . Hypothyroidism   . IBS (irritable bowel syndrome)   . PONV (postoperative nausea and vomiting)         Morbid obesity; status post gastric bypass in 2013.       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       Axios stent placed in September 2020 and [facility]       History of vitamin D deficiency. Past Surgical History:  Procedure Laterality Date  . ABDOMINAL HYSTERECTOMY  08/11/2011   Procedure: HYSTERECTOMY ABDOMINAL;  Surgeon: Tilda BurrowJohn V Ferguson, MD;  Location: [facility];  Service: Gynecology;  Laterality: N/A;  . CESAREAN SECTION     x 2  . CHOLECYSTECTOMY  2009   [facility]  . ESOPHAGOGASTRODUODENOSCOPY  Jan 2004   RMR: normal esophagus, small hiatal hernia, normal D1, D2  . FOOT SURGERY     x 5 right  . GASTRIC BYPASS    . HAND SURGERY     x2 right  . HEMATOMA EVACUATION  08/14/2011   Procedure: EVACUATION HEMATOMA;  Surgeon: [name], MD;  Location: [facility];  Service: Gynecology;  Laterality: N/A;  . SALPINGOOPHORECTOMY  08/11/2011   Procedure: SALPINGO OOPHERECTOMY;  Surgeon: [name], MD;  Location: [facility];  Service: Gynecology;  Laterality: Bilateral;  . SCAR REVISION  08/11/2011   Procedure: SCAR REVISION;  Surgeon: [name], MD;  Location: [facility];  Service: Gynecology;  Laterality: N/A;  Excision of Wide Cicatrix    Prior to Admission medications   Medication Sig Start Date End Date Taking? Authorizing Provider  ALPRAZolam Prudy Feeler) 0.5 MG tablet Take 0.5 mg by mouth at bedtime. *May take up to 4 times daily as needed For anxiety and sleep   Yes [provider]  BIOTIN PO Take 1 tablet by mouth daily.   Yes [provider]  CALCIUM PO Take 1 tablet by mouth daily.   Yes [provider]  CRANBERRY FRUIT PO Take 1 tablet by mouth daily.   Yes [provider]  Cyanocobalamin (B-12 PO) Take 1 tablet by mouth daily.   Yes [provider]  diphenhydrAMINE (BENADRYL) 25 mg capsule Take 25 mg by mouth 3 (three) times daily as needed for itching or allergies.   Yes [provider]  ferrous sulfate 325 (65 FE) MG tablet Take 325 mg by mouth daily.   Yes [provider]  levothyroxine (SYNTHROID, LEVOTHROID) 125 MCG tablet Take 125 mcg by mouth daily before breakfast.  06/11/15  Yes [provider]  misoprostol (CYTOTEC) 100 MCG tablet Take 100 mcg by mouth 4 (four) times daily.  09/30/18  Yes [provider]  Multiple Vitamin (MULTIVITAMIN WITH MINERALS) TABS tablet Take 2 tablets by mouth daily.   Yes [provider]  omeprazole (PRILOSEC) 20 MG capsule Take 20 mg by mouth daily as needed (stomach acid).  09/30/18  Yes [provider]  promethazine (PHENERGAN) 25 MG tablet Take 25 mg by mouth every 6 (six) hours as needed. For nausea   Yes [provider]  thiamine (VITAMIN B-1) 100 MG tablet Take 100 mg by mouth daily.   Yes [provider]  traMADol (ULTRAM) 50 MG tablet Take 50 mg by mouth every 4 (four) hours as needed for pain. 01/09/20  Yes [provider]  Vitamin D, Ergocalciferol, (DRISDOL) 1.25 MG (50000 UNIT) CAPS capsule Take 50,000 Units by mouth once a week. 08/29/19  Yes [provider]  dicyclomine (BENTYL) 20 MG tablet Take 1 tablet (20 mg total) by mouth 3 (three) times daily as needed. For diarrhea and cramping 06/09/11 06/19/15  Gelene Mink, NP    Current Facility-Administered Medications  Medication Dose Route Frequency Provider Last Rate Last Admin  . 0.9 %  sodium chloride infusion  10 mL/hr Intravenous  Once Johnson, Clanford L, MD      . acetaminophen (TYLENOL) tablet 650 mg  650 mg Oral Q6H PRN Johnson, Clanford L, MD       Or  . acetaminophen (TYLENOL) suppository 650 mg  650 mg Rectal Q6H PRN Johnson, Clanford L, MD      . ALPRAZolam Prudy Feeler) tablet 0.5 mg  0.5 mg Oral TID PRN Johnson, Clanford L, MD      . bisacodyl (DULCOLAX) EC tablet 5 mg  5 mg Oral Daily PRN Laural Benes, Clanford L, MD      . Melene Muller ON 01/11/2020] calcium carbonate (TUMS - dosed in mg elemental calcium) chewable tablet 200 mg of elemental calcium  1 tablet Oral Daily Johnson, Clanford L,  MD      . diphenhydrAMINE (BENADRYL) capsule 25 mg  25 mg Oral TID PRN Johnson, Clanford L, MD      . [START ON 01/11/2020] ferrous sulfate tablet 325 mg  325 mg Oral Q breakfast Johnson, Clanford L, MD      . [START ON 01/11/2020] levothyroxine (SYNTHROID) tablet 125 mcg  125 mcg Oral QAC breakfast Johnson, Clanford L, MD      . metoprolol tartrate (LOPRESSOR) injection 2.5 mg  2.5 mg Intravenous Q6H Johnson, Clanford L, MD      . multivitamin with minerals tablet 1 tablet  1 tablet Oral Daily Johnson, Clanford L, MD   1 tablet at 01/10/20 1628  . ondansetron (ZOFRAN) tablet 4 mg  4 mg Oral Q6H PRN Johnson, Clanford L, MD       Or  . ondansetron (ZOFRAN) injection 4 mg  4 mg Intravenous Q6H PRN Johnson, Clanford L, MD      . [START ON 01/11/2020] pantoprazole (PROTONIX) injection 40 mg  40 mg Intravenous Q24H Johnson, Clanford L, MD      . promethazine (PHENERGAN) tablet 25 mg  25 mg Oral Q6H PRN Johnson, Clanford L, MD      . thiamine (Vitamin B-1) tablet 100 mg  100 mg Oral Daily Johnson, Clanford L, MD   100 mg at 01/10/20 1628  . traMADol (ULTRAM) tablet 50 mg  50 mg Oral Q6H PRN Johnson, Clanford L, MD      . [START ON 01/11/2020] vitamin B-12 (CYANOCOBALAMIN) tablet 1,000 mcg  1,000 mcg Oral Daily Johnson, Clanford L, MD      . [START ON 01/11/2020] Vitamin D (Ergocalciferol) (DRISDOL) capsule 50,000 Units  50,000 Units Oral Weekly Johnson,  Clanford L, MD       Current Outpatient Medications  Medication Sig Dispense Refill  . ALPRAZolam (XANAX) 0.5 MG tablet Take 0.5 mg by mouth at bedtime. *May take up to 4 times daily as needed For anxiety and sleep    . BIOTIN PO Take 1 tablet by mouth daily.    . CALCIUM PO Take 1 tablet by mouth daily.    . CRANBERRY FRUIT PO Take 1 tablet by mouth daily.    . Cyanocobalamin (B-12 PO) Take 1 tablet by mouth daily.    . diphenhydrAMINE (BENADRYL) 25 mg capsule Take 25 mg by mouth 3 (three) times daily as needed for itching or allergies.    . ferrous sulfate 325 (65 FE) MG tablet Take 325 mg by mouth daily.    . levothyroxine (SYNTHROID, LEVOTHROID) 125 MCG tablet Take 125 mcg by mouth daily before breakfast.     . misoprostol (CYTOTEC) 100 MCG tablet Take 100 mcg by mouth 4 (four) times daily.     . Multiple Vitamin (MULTIVITAMIN WITH MINERALS) TABS tablet Take 2 tablets by mouth daily.    . omeprazole (PRILOSEC) 20 MG capsule Take 20 mg by mouth daily as needed (stomach acid).     . promethazine (PHENERGAN) 25 MG tablet Take 25 mg by mouth every 6 (six) hours as needed. For nausea    . thiamine (VITAMIN B-1) 100 MG tablet Take 100 mg by mouth daily.    . traMADol (ULTRAM) 50 MG tablet Take 50 mg by mouth every 4 (four) hours as needed for pain.    . Vitamin D, Ergocalciferol, (DRISDOL) 1.25 MG (50000 UNIT) CAPS capsule Take 50,000 Units by mouth once a week.    . dicyclomine (BENTYL) 20 MG tablet Take 1 tablet (  20 mg total) by mouth 3 (three) times daily as needed. For diarrhea and cramping 30 tablet 1    Allergies as of 01/10/2020 - Review Complete 01/10/2020  Allergen Reaction Noted  . Bee pollen Anaphylaxis 08/04/2011  . Hydrocodone-acetaminophen Itching 06/19/2015  . Chocolate Other (See Comments) 07/02/2011  . Nsaids Other (See Comments) 07/31/2011  . Synthroid [levothyroxine sodium] Itching and Swelling 06/09/2011  . Latex Rash 07/07/2011  . Neosporin [neomycin-bacitracin  zn-polymyx] Rash 07/07/2011    Family history.  Father died of ruptured aneurysm possibly thoracic.  Age unknown. Mother is 24 years old and doing well. She has 1 sister age 46 who is also in good health.  Social history.  Patient is widowed.  She lost her husband and 2005 because of Doreatha Martin disease. She has daughter age 29 and son aged 49 in good health. Patient is presently unemployed.  She worked as [employer] for 15 years.  Lately she has been working in a [employer].  She has never smoked cigarettes and does not drink alcohol.   Review of Systems: See HPI, otherwise normal ROS  Physical Exam: Temp:  [97.9 F (36.6 C)] 97.9 F (36.6 C) (12/01 0901) Pulse Rate:  [74-169] 105 (12/01 1730) Resp:  [11-26] 21 (12/01 1730) BP: (82-134)/(42-92) 132/92 (12/01 1730) SpO2:  [83 %-100 %] 100 % (12/01 1730) FiO2 (%):  [21 %] 21 % (12/01 1427)   Patient is alert and in no acute distress. Conjunctiva is pale.  Sclerae nonicteric. Oropharyngeal mucosa is normal.  Dentition in poor condition. Neck without masses adenopathy or thyromegaly. Cardiac exam with regular rhythm normal S1 and S2.  No murmur gallop noted. Auscultation lungs reveal vesicular breath sounds bilaterally. Abdomen is symmetrical.  Bowel sounds are normal.  She has laparoscopy scars across upper abdomen.  On palpation abdomen is soft and nontender with no organomegaly or masses. She does not have peripheral edema clubbing or koilonychia.   Total I/O In: 1000 [IV Piggyback:1000] Out: -   Lab Results: Recent Labs    01/10/20 1013  WBC 4.3  HGB 6.0*  HCT 21.9*  PLT 237   BMET Recent Labs    01/10/20 1013  NA 137  K 4.3  CL 109  CO2 23  GLUCOSE 121*  BUN 18  CREATININE 0.67  CALCIUM 7.7*   LFT Recent Labs    01/10/20 1013  PROT 4.6*  ALBUMIN 2.1*  AST 59*  ALT 45*  ALKPHOS 188*  BILITOT 0.2*   Serum iron 11 TIBC 317 and saturation 3% Serum ferritin 4. Vitamin B12 135.  Folate  17.4.   Studies/Results: DG Chest Port 1 View  Result Date: 01/10/2020 CLINICAL DATA:  Weakness and tachycardia. EXAM: PORTABLE CHEST 1 VIEW COMPARISON:  Chest radiograph April 29, 2011 FINDINGS: The heart size and mediastinal contours are within normal limits. Chronic parenchymal lung changes without focal consolidation. No visible pneumothorax. No pleural effusion. Thoracic spondylosis. IMPRESSION: No acute cardiopulmonary process. Electronically Signed   By: Maudry Mayhew MD   On: 01/10/2020 09:31    Assessment;  Patient is 59 year old Caucasian female with history of laparoscopic gastric bypass in 2013 for morbid obesity, history of vitamin D and  iron deficiency anemia who presents with profound anemia and noted to have heme positive stool.  She develop high-grade gastrojejunal anastomotic stricture last year unresponsive to balloon dilation eventually treated with stenting for 3 months.  She has not been taking iron supplement as recommended. She did have colonoscopy in July 2020  which was unremarkable or at least she did not have any polyps.  Patient is to receive 2 units of PRBCs today. Lab studies also pertinent for mildly elevated transaminases and alkaline phosphatase. GI issues can be summarized as below.  #1. iron deficiency anemia.  I suspect the main culprit is iron malabsorption given history of bariatric surgery.  Since stool is guaiac positive she could have anastomotic ulcer.  If upper GI tract is unremarkable should also review small bowel study.  #2.  B12 deficiency.  This is also contributing to her anemia.  I suspect B12 deficiency is due to malabsorption secondary to gastric bypass surgery.  She could also have pernicious anemia.  I agree with parenteral B12 therapy.  #3.  Abnormal LFTs.  She has mildly elevated transaminases and alkaline phosphatase.  Transaminases were normal in May 2020 when alkaline phosphatase was elevated at 198. She is status post cholecystectomy.   Need to rule out viral infection as well as primary biliary cholangitis among other things.   Recommendations;  Hold p.o. iron until endoscopic evaluation completed. Hepatitis B surface antigen, HCV antibody and mitochondrial antibody. Abdominal ultrasound. Esophagogastroduodenoscopy with jejunal biopsy to be performed by Dr. Jena Gauss on 01/11/2020. I would also recommend endoscopic placement of small bowel given capsule if no bleeding lesion noted on EGD. Patient can have clear liquids tonight and will need to be n.p.o. after midnight.   LOS: 0 days   Vernal Rutan  01/10/2020, 5:56 PM

## 2020-01-10 NOTE — ED Notes (Signed)
Pt provided ice chips.

## 2020-01-10 NOTE — H&P (Signed)
History and Physical  [facility]  Tracy KEEVEN NWG:[id number] DOB: 10-27-1960 DOA: 01/10/2020  PCP: Assunta Found, MD  Patient coming from: Home   I have personally briefly reviewed patient's old medical records in [facility]  Chief Complaint: dizziness   HPI: CAMIYA VINAL is a 59 y.o. female with medical history significant gastric bypass surgery about 5 years ago complicated by subsequently developing gastric ulcers and esophageal stricture about 1 year after the procedure.  She weighed nearly 400 pounds when she had her gastric bypass and has lost a significant amount of weight with her lowest weight being near 120 pounds.  She was treated with esophageal stent placement which has now been removed and was supposed to be taking PPI therapy but she is not taking it regularly.  She was using heavy NSAIDs in the past but denies using them now.  She does take some vitamins.    She reports having palpitations that started early this morning associated with dizziness and shortness of breath.  EMS was called and apparently she was given adenosine 6 mg and 12 mg for presumed SVT and had some initial improvement in heart rate but unfortunately heart rate increased again in the ED temporarily but then improved afterwards.  Lab work was ordered revealing a Hg of 6.0.  Pt says that she has a history of iron deficiency.  She reports black stools only when taking pepto-bismol occasionally.  She reports that she was told that she had ulcers but has not followed up with GI at [facility] in quite a while.  Her stool was tested by ED provider as hemoccult positive.  She has had a total hysterectomy and denies menorrhagia.   She is being typed and crossed to transfuse 2 units PRBCs.  She is being started on IV protonix.  She is being admitted for further management.   Review of Systems: As per HPI otherwise 10 point review of systems negative.   Past Medical History:  Diagnosis Date  .  Depression    started Lexapro when husband passed away  . Hypertension   . Hypothyroidism   . IBS (irritable bowel syndrome)   . PONV (postoperative nausea and vomiting)     Past Surgical History:  Procedure Laterality Date  . ABDOMINAL HYSTERECTOMY  08/11/2011   Procedure: HYSTERECTOMY ABDOMINAL;  Surgeon: [name], MD;  Location: [facility];  Service: Gynecology;  Laterality: N/A;  . CESAREAN SECTION     x 2  . CHOLECYSTECTOMY  2009   [facility]  . ESOPHAGOGASTRODUODENOSCOPY  Jan 2004   RMR: normal esophagus, small hiatal hernia, normal D1, D2  . FOOT SURGERY     x 5 right  . GASTRIC BYPASS    . HAND SURGERY     x2 right  . HEMATOMA EVACUATION  08/14/2011   Procedure: EVACUATION HEMATOMA;  Surgeon: [name], MD;  Location: [facility];  Service: Gynecology;  Laterality: N/A;  . SALPINGOOPHORECTOMY  08/11/2011   Procedure: SALPINGO OOPHERECTOMY;  Surgeon: [name], MD;  Location: [facility];  Service: Gynecology;  Laterality: Bilateral;  . SCAR REVISION  08/11/2011   Procedure: SCAR REVISION;  Surgeon: [name], MD;  Location: [facility];  Service: Gynecology;  Laterality: N/A;  Excision of Wide Cicatrix     reports that she has never smoked. She has never used smokeless tobacco. She reports that she does not drink alcohol and does not use drugs.  Allergies  Allergen Reactions  .  Bee Pollen Anaphylaxis  . Hydrocodone-Acetaminophen Itching  . Chocolate Other (See Comments)    Reaction: causes facial blistering,inside mouth  . Nsaids Other (See Comments)    vomiting  . Synthroid [Levothyroxine Sodium] Itching and Swelling    Patient has a reaction to brand name synthroid only  . Latex Rash  . Neosporin [Neomycin-Bacitracin Zn-Polymyx] Rash    Family History  Problem Relation Age of Onset  . Colon cancer Neg Hx     Prior to Admission medications   Medication Sig Start Date End Date Taking? Authorizing Provider  ALPRAZolam Prudy Feeler(XANAX) 0.5 MG tablet Take 0.5 mg by mouth at  bedtime. *May take up to 4 times daily as needed For anxiety and sleep   Yes [provider]  BIOTIN PO Take 1 tablet by mouth daily.   Yes [provider]  CALCIUM PO Take 1 tablet by mouth daily.   Yes [provider]  CRANBERRY FRUIT PO Take 1 tablet by mouth daily.   Yes [provider]  Cyanocobalamin (B-12 PO) Take 1 tablet by mouth daily.   Yes [provider]  diphenhydrAMINE (BENADRYL) 25 mg capsule Take 25 mg by mouth 3 (three) times daily as needed for itching or allergies.   Yes [provider]  ferrous sulfate 325 (65 FE) MG tablet Take 325 mg by mouth daily.   Yes [provider]  levothyroxine (SYNTHROID, LEVOTHROID) 125 MCG tablet Take 125 mcg by mouth daily before breakfast.  06/11/15  Yes [provider]  misoprostol (CYTOTEC) 100 MCG tablet Take 100 mcg by mouth 4 (four) times daily.  09/30/18  Yes [provider]  Multiple Vitamin (MULTIVITAMIN WITH MINERALS) TABS tablet Take 2 tablets by mouth daily.   Yes [provider]  omeprazole (PRILOSEC) 20 MG capsule Take 20 mg by mouth daily as needed (stomach acid).  09/30/18  Yes [provider]  promethazine (PHENERGAN) 25 MG tablet Take 25 mg by mouth every 6 (six) hours as needed. For nausea   Yes [provider]  thiamine (VITAMIN B-1) 100 MG tablet Take 100 mg by mouth daily.   Yes [provider]  traMADol (ULTRAM) 50 MG tablet Take 50 mg by mouth every 4 (four) hours as needed for pain. 01/09/20  Yes [provider]  Vitamin D, Ergocalciferol, (DRISDOL) 1.25 MG (50000 UNIT) CAPS capsule Take 50,000 Units by mouth once a week. 08/29/19  Yes [provider]  dicyclomine (BENTYL) 20 MG tablet Take 1 tablet (20 mg total) by mouth 3 (three) times daily as needed. For diarrhea and cramping 06/09/11 06/19/15  Gelene MinkBoone, Anna W, NP    Physical Exam: Vitals:   01/10/20 1430 01/10/20 1500 01/10/20 1530 01/10/20  1645  BP: (!) 120/58 107/61 117/76 (!) 134/58  Pulse: 83 86 94 85  Resp: 11 16 15 19   Temp:      TempSrc:      SpO2: 100% 96% 100% 100%   Constitutional: NAD, calm, comfortable. Pt appears pale.  Eyes: PERRL, lids and conjunctivae normal ENMT: Mucous membranes are pale. Posterior pharynx clear of any exudate or lesions.Normal dentition.  Neck: normal, supple, no masses, no thyromegaly Respiratory: clear to auscultation bilaterally, no wheezing, no crackles. Normal respiratory effort. No accessory muscle use.  Cardiovascular: Regular rate and rhythm, no murmurs / rubs / gallops. No extremity edema. 2+ pedal pulses. No carotid bruits.  Abdomen: no tenderness, no masses palpated. No hepatosplenomegaly. Bowel sounds positive.  Musculoskeletal: no clubbing / cyanosis. No  joint deformity upper and lower extremities. Good ROM, no contractures. Normal muscle tone.  Skin: no rashes, lesions, ulcers. No induration Neurologic: CN 2-12 grossly intact. Sensation intact, DTR normal. Strength 5/5 in all 4.  Psychiatric: Normal judgment and insight. Alert and oriented x 3. Normal mood.   Labs on Admission: I have personally reviewed following labs and imaging studies  CBC: Recent Labs  Lab 01/10/20 1013  WBC 4.3  NEUTROABS 3.4  HGB 6.0*  HCT 21.9*  MCV 79.1*  PLT 237   Basic Metabolic Panel: Recent Labs  Lab 01/10/20 1013  NA 137  K 4.3  CL 109  CO2 23  GLUCOSE 121*  BUN 18  CREATININE 0.67  CALCIUM 7.7*   GFR: CrCl cannot be calculated (Unknown ideal weight.). Liver Function Tests: Recent Labs  Lab 01/10/20 1013  AST 59*  ALT 45*  ALKPHOS 188*  BILITOT 0.2*  PROT 4.6*  ALBUMIN 2.1*   No results for input(s): LIPASE, AMYLASE in the last 168 hours. No results for input(s): AMMONIA in the last 168 hours. Coagulation Profile: No results for input(s): INR, PROTIME in the last 168 hours. Cardiac Enzymes: No results for input(s): CKTOTAL, CKMB, CKMBINDEX, TROPONINI in the  last 168 hours. BNP (last 3 results) No results for input(s): PROBNP in the last 8760 hours. HbA1C: No results for input(s): HGBA1C in the last 72 hours. CBG: No results for input(s): GLUCAP in the last 168 hours. Lipid Profile: No results for input(s): CHOL, HDL, LDLCALC, TRIG, CHOLHDL, LDLDIRECT in the last 72 hours. Thyroid Function Tests: Recent Labs    01/10/20 1013  TSH 7.309*   Anemia Panel: Recent Labs    01/10/20 1013 01/10/20 1222  VITAMINB12  --  135*  FOLATE 17.4  --   FERRITIN  --  4*  TIBC  --  317  IRON  --  11*  RETICCTPCT 1.6  --    Urine analysis:    Component Value Date/Time   COLORURINE YELLOW 08/11/2011 0855   APPEARANCEUR CLEAR 08/11/2011 0855   LABSPEC 1.010 08/11/2011 0855   PHURINE 5.5 08/11/2011 0855   GLUCOSEU NEGATIVE 08/11/2011 0855   HGBUR TRACE (A) 08/11/2011 0855   BILIRUBINUR NEGATIVE 08/11/2011 0855   KETONESUR NEGATIVE 08/11/2011 0855   PROTEINUR NEGATIVE 08/11/2011 0855   UROBILINOGEN 0.2 08/11/2011 0855   NITRITE NEGATIVE 08/11/2011 0855   LEUKOCYTESUR MODERATE (A) 08/11/2011 0855    Radiological Exams on Admission: DG Chest Port 1 View  Result Date: 01/10/2020 CLINICAL DATA:  Weakness and tachycardia. EXAM: PORTABLE CHEST 1 VIEW COMPARISON:  Chest radiograph April 29, 2011 FINDINGS: The heart size and mediastinal contours are within normal limits. Chronic parenchymal lung changes without focal consolidation. No visible pneumothorax. No pleural effusion. Thoracic spondylosis. IMPRESSION: No acute cardiopulmonary process. Electronically Signed   By: Maudry Mayhew MD   On: 01/10/2020 09:31    EKG: Independently reviewed. NSR  Assessment/Plan Principal Problem:   Symptomatic anemia Active Problems:   IBS (irritable bowel syndrome)   Heme positive stool   Vitamin B12 deficiency   Iron deficiency anemia   Sinus tachycardia   Hypothyroidism   S/P hysterectomy with oophorectomy   Hypertension   Generalized anxiety  disorder   S/P gastric bypass   Multiple vitamin deficiency     1. Symptomatic anemia / Iron deficiency anemia - Pt presented with heme positive stool and hg of 6.0.  No recent tests to compare but in 2013 she did have a Hg of 9.  Continue IV protonix.  Transfuse 2 units PRBC.  Follow CBC.  GI consultation requested as she may need upper endoscopy.  She reports normal colonoscopy 1 year ago.  2. Vitamin B12 deficiency - B12 injection ordered, oral B12 tablets ordered.  Follow.  3. Tachycardia  - monitor on telemetry.  IV lopressor ordered with holding parameters.  4. Essential hypertension - IV lopressor as ordered.  5. GAD - resume home medication.  6. Hypothyroidism -check TSH and free T4.  7. History of gastric ulcer and esophageal stricture - IV protonix and GI consult requested.  8. Vitamin deficiencies s/p Gastric bypass - resume home supplements, checking vitamin D level.  Supplementing B12.   DVT prophylaxis: SCDs Code Status: Full   Family Communication: patient at bedside   Disposition Plan: home   Consults called: GI   Admission status: INP   Previn Jian MD [facility] How to contact the [facility] Attending or Consulting provider 7A - 7P or covering provider during after hours 7P -7A, for this patient?  1. Check the care team in [facility] and look for a) attending/consulting [facility] provider listed and b) the [facility] team listed 2. Log into [email or web address] and use [facility]'s universal password to access. If you do not have the password, please contact the hospital operator. 3. Locate the [facility] provider you are looking for under [facility] and page to a number that you can be directly reached. 4. If you still have difficulty reaching the provider, please page the [facility] (Director on Call) for the Hospitalists listed on amion for assistance.   If 7PM-7AM, please contact night-coverage [email or web address] Password [facility]  01/10/2020, 5:16 PM

## 2020-01-10 NOTE — ED Provider Notes (Signed)
[facility] EMERGENCY DEPARTMENT Provider Note   CSN: [id number] Arrival date & time: 01/10/20  7829     History Chief Complaint  Patient presents with   Tachycardia    KATHELINE BRENDLINGER is a 59 y.o. female.  Presented with tachycardia at a rate of 170 by paramedics.  She was given adenosine 6 mg and 12 mg.  When she came to the emergency department her rate was about 150 but then slowed down to 120.   Patient has a history of gastric bypass surgery  The history is provided by the patient and medical records. No language interpreter was used.  Weakness Severity:  Mild Onset quality:  Sudden Timing:  Intermittent Progression:  Waxing and waning Chronicity:  New Context: not alcohol use   Relieved by:  Nothing Worsened by:  Nothing Ineffective treatments:  None tried Associated symptoms: no abdominal pain, no chest pain, no cough, no diarrhea, no frequency, no headaches and no seizures        Past Medical History:  Diagnosis Date   Depression    started Lexapro when husband passed away   Hypertension    Hypothyroidism    IBS (irritable bowel syndrome)    PONV (postoperative nausea and vomiting)     Patient Active Problem List   Diagnosis Date Noted   IBS (irritable bowel syndrome) 06/09/2011    Past Surgical History:  Procedure Laterality Date   ABDOMINAL HYSTERECTOMY  08/11/2011   Procedure: HYSTERECTOMY ABDOMINAL;  Surgeon: [name], MD;  Location: [facility];  Service: Gynecology;  Laterality: N/A;   CESAREAN SECTION     x 2   CHOLECYSTECTOMY  2009   [facility]   ESOPHAGOGASTRODUODENOSCOPY  Jan 2004   RMR: normal esophagus, small hiatal hernia, normal D1, D2   FOOT SURGERY     x 5 right   GASTRIC BYPASS     HAND SURGERY     x2 right   HEMATOMA EVACUATION  08/14/2011   Procedure: EVACUATION HEMATOMA;  Surgeon: [name], MD;  Location: [facility];  Service: Gynecology;  Laterality: N/A;   SALPINGOOPHORECTOMY  08/11/2011   Procedure: SALPINGO  OOPHERECTOMY;  Surgeon: [name], MD;  Location: [facility];  Service: Gynecology;  Laterality: Bilateral;   SCAR REVISION  08/11/2011   Procedure: SCAR REVISION;  Surgeon: [name], MD;  Location: [facility];  Service: Gynecology;  Laterality: N/A;  Excision of Wide Cicatrix     OB History   No obstetric history on file.     Family History  Problem Relation Age of Onset   Colon cancer Neg Hx     Social History   Tobacco Use   Smoking status: Never Smoker   Smokeless tobacco: Never Used  Substance Use Topics   Alcohol use: No   Drug use: No    Home Medications Prior to Admission medications   Medication Sig Start Date End Date Taking? Authorizing Provider  ALPRAZolam Prudy Feeler) 0.5 MG tablet Take 0.5 mg by mouth at bedtime. *May take up to 4 times daily as needed For anxiety and sleep    [provider]  BIOTIN PO Take 1 tablet by mouth daily.    [provider]  CALCIUM PO Take 1 tablet by mouth daily.    [provider]  CRANBERRY FRUIT PO Take 1 tablet by mouth daily.    [provider]  Cyanocobalamin (B-12 PO) Take 1 tablet by mouth daily.    [provider]  dicyclomine (BENTYL) 20 MG tablet Take 1 tablet (20 mg total) by mouth 3 (three) times daily as needed. For diarrhea and cramping 06/09/11 06/19/15  Gelene Mink, NP  diphenhydrAMINE (BENADRYL) 25 mg capsule Take 25 mg by mouth 3 (three) times daily as needed for itching or allergies.    [provider]  hydrOXYzine (ATARAX/VISTARIL) 25 MG tablet Take 1 tablet (25 mg total) by mouth every 6 (six) hours. May cause drowiness 08/09/17   Triplett, Tammy, PA-C  levothyroxine (SYNTHROID, LEVOTHROID) 125 MCG tablet Take 125 mcg by mouth daily before breakfast.  06/11/15   [provider]  Multiple Vitamin (MULTIVITAMIN WITH MINERALS) TABS tablet Take 2 tablets by mouth daily.    [provider]  oxyCODONE-acetaminophen (PERCOCET/ROXICET) 5-325 MG tablet  Take 2 tablets by mouth every 4 (four) hours as needed for severe pain. 06/19/15   Elson Areas, PA-C  predniSONE (DELTASONE) 10 MG tablet Take 6 tablets day one, 5 tablets day two, 4 tablets day three, 3 tablets day four, 2 tablets day five, then 1 tablet day six 08/09/17   Triplett, Tammy, PA-C  promethazine (PHENERGAN) 25 MG tablet Take 25 mg by mouth every 6 (six) hours as needed. For nausea    [provider]  thiamine (VITAMIN B-1) 100 MG tablet Take 100 mg by mouth daily.    [provider]    Allergies    Bee pollen, Hydrocodone-acetaminophen, Chocolate, Nsaids, Synthroid [levothyroxine sodium], Latex, and Neosporin [neomycin-bacitracin zn-polymyx]  Review of Systems   Review of Systems  Constitutional: Negative for appetite change and fatigue.  HENT: Negative for congestion, ear discharge and sinus pressure.   Eyes: Negative for discharge.  Respiratory: Negative for cough.   Cardiovascular: Positive for palpitations. Negative for chest pain.  Gastrointestinal: Negative for abdominal pain and diarrhea.  Genitourinary: Negative for frequency and hematuria.  Musculoskeletal: Negative for back pain.  Skin: Negative for rash.  Neurological: Positive for weakness. Negative for seizures and headaches.  Psychiatric/Behavioral: Negative for hallucinations.    Physical Exam Updated Vital Signs BP (!) 120/57    Pulse (!) 102    Temp 97.9 F (36.6 C) (Oral)    Resp (!) 26    LMP 08/14/2011    SpO2 99%   Physical Exam Vitals and nursing note reviewed.  Constitutional:      Appearance: She is well-developed.  HENT:     Head: Normocephalic.     Nose: Nose normal.  Eyes:     General: No scleral icterus.    Conjunctiva/sclera: Conjunctivae normal.  Neck:     Thyroid: No thyromegaly.  Cardiovascular:     Rate and Rhythm: Tachycardia present. Rhythm irregular.     Heart sounds: No murmur heard.  No friction rub. No gallop.   Pulmonary:     Breath sounds: No  stridor. No wheezing or rales.  Chest:     Chest wall: No tenderness.  Abdominal:     General: There is no distension.     Tenderness: There is no abdominal tenderness. There is no rebound.  Genitourinary:    Comments: Rectal exam shows bright red blood just a small amount of heme positive Musculoskeletal:        General: Normal range of motion.     Cervical back: Neck supple.  Lymphadenopathy:     Cervical: No cervical adenopathy.  Skin:    Findings: No erythema or rash.  Neurological:     Mental Status: She is alert and oriented to  person, place, and time.     Motor: No abnormal muscle tone.     Coordination: Coordination normal.  Psychiatric:        Behavior: Behavior normal.     ED Results / Procedures / Treatments   Labs (all labs ordered are listed, but only abnormal results are displayed) Labs Reviewed  CBC WITH DIFFERENTIAL/PLATELET - Abnormal; Notable for the following components:      Result Value   RBC 2.77 (*)    Hemoglobin 6.0 (*)    HCT 21.9 (*)    MCV 79.1 (*)    MCH 21.7 (*)    MCHC 27.4 (*)    RDW 16.7 (*)    Lymphs Abs 0.6 (*)    All other components within normal limits  COMPREHENSIVE METABOLIC PANEL - Abnormal; Notable for the following components:   Glucose, Bld 121 (*)    Calcium 7.7 (*)    Total Protein 4.6 (*)    Albumin 2.1 (*)    AST 59 (*)    ALT 45 (*)    Alkaline Phosphatase 188 (*)    Total Bilirubin 0.2 (*)    All other components within normal limits  VITAMIN B12 - Abnormal; Notable for the following components:   Vitamin B-12 135 (*)    All other components within normal limits  IRON AND TIBC - Abnormal; Notable for the following components:   Iron 11 (*)    Saturation Ratios 3 (*)    All other components within normal limits  FERRITIN - Abnormal; Notable for the following components:   Ferritin 4 (*)    All other components within normal limits  RETICULOCYTES - Abnormal; Notable for the following components:   RBC. 2.82 (*)     Immature Retic Fract 30.1 (*)    All other components within normal limits  FOLATE  OCCULT BLOOD X 1 CARD TO LAB, STOOL  PREPARE RBC (CROSSMATCH)  TYPE AND SCREEN  TROPONIN I (HIGH SENSITIVITY)  TROPONIN I (HIGH SENSITIVITY)    EKG None  Radiology DG Chest Port 1 View  Result Date: 01/10/2020 CLINICAL DATA:  Weakness and tachycardia. EXAM: PORTABLE CHEST 1 VIEW COMPARISON:  Chest radiograph April 29, 2011 FINDINGS: The heart size and mediastinal contours are within normal limits. Chronic parenchymal lung changes without focal consolidation. No visible pneumothorax. No pleural effusion. Thoracic spondylosis. IMPRESSION: No acute cardiopulmonary process. Electronically Signed   By: Maudry Mayhew MD   On: 01/10/2020 09:31    Procedures Procedures (including critical care time)  Medications Ordered in ED Medications  0.9 %  sodium chloride infusion (has no administration in time range)  pantoprazole (PROTONIX) injection 40 mg (has no administration in time range)  sodium chloride 0.9 % bolus 1,000 mL (0 mLs Intravenous Stopped 01/10/20 1321)  metoprolol tartrate (LOPRESSOR) injection 2.5 mg (2.5 mg Intravenous Given 01/10/20 4010)    ED Course  I have reviewed the triage vital signs and the nursing notes.  Pertinent labs & imaging results that were available during my care of the patient were reviewed by me and considered in my medical decision making (see chart for details). CRITICAL CARE Performed by: Bethann Berkshire Total critical care time: 40  minutes Critical care time was exclusive of separately billable procedures and treating other patients. Critical care was necessary to treat or prevent imminent or life-threatening deterioration. Critical care was time spent personally by me on the following activities: development of treatment plan with patient and/or surrogate as well as nursing,  discussions with consultants, evaluation of patient's response to treatment, examination  of patient, obtaining history from patient or surrogate, ordering and performing treatments and interventions, ordering and review of laboratory studies, ordering and review of radiographic studies, pulse oximetry and re-evaluation of patient's condition.   Patient had a heart rate of 150 in the emergency department but it quickly slowed down to 120 was slightly irregular.  She was given 10 mg of Lopressor and her heart rate became less than 100 but in sinus rhythm.  During her work-up it was noted that her hemoglobin was 6 and she had a rectal exam done that showed small amount of blood.  Patient has a history of a normal colonoscopy a year ago. MDM Rules/Calculators/A&P                          Patient with tachycardia that has resolved and severe anemia with a hemoglobin of 6.  She will be admitted to medicine Final Clinical Impression(s) / ED Diagnoses Final diagnoses:  None    Rx / DC Orders ED Discharge Orders    None       Bethann BerkshireZammit, Khai Arrona, MD 01/10/20 1427

## 2020-01-10 NOTE — ED Notes (Addendum)
Date and time results received: 01/10/20 11:24 AM  (use smartphrase ".now" to insert current time)  Test: Hem Critical Value: 6.0  Name of Provider Notified: Dr. Estell Harpin  Orders Received? Or Actions Taken?: Blood transfusion orders

## 2020-01-10 NOTE — ED Notes (Signed)
This pt. States that when they used the restroom their toilet paper had blood and clots present after a bowel movement.

## 2020-01-10 NOTE — H&P (View-Only) (Signed)
Referring Provider: Standley Dakinslanford Johnson, MD Primary Care Physician:  Assunta FoundGolding, John, MD Primary Gastroenterologist:  Dr. Karilyn Cotaehman  Reason for Consultation:   Iron deficiency anemia.  HPI:   Patient is 59 year old Caucasian female with complicated GI history who presented to emergency room earlier today after having near syncopal episode at home.  She was found to have a hemoglobin of 6 g.  Her stool was guaiac positive and iron studies confirm iron deficiency anemia and B12 level is also low with normal folate levels.  Patient says she has had weak spells for the last 6 months but she thought was low blood glucose as she would drink orange juice and feel better.  She is getting ready to receive 2 units of PRBCs. Patient denies melena or rectal bleeding hematuria or vaginal bleeding.  She generally has 2-3 bowel movements per day which has been close to her baseline.  She says she has a history of IBS.  Patient says she is supposed with multiple small meals but she generally eats 2-1/2 meals per day.  She says she does not have a good appetite.  However she has not lost any weight.  She does experience emesis if she eats spaghetti or fatty foods.  She denies heartburn or dysphagia.  She has a history of iron deficiency anemia and used to be on p.o. iron.  Last year when she developed anastomotic stricture and experience nausea and vomiting she stopped taking iron and has not been taking regularly.  She does not take OTC NSAIDs.  She has noted craving for ice.  GI history is as follows.  She has screening colonoscopy by Dr. Darrick PennaFields in June 2013 revealing few diverticula at descending and sigmoid colon internal hemorrhoids.  [facility].  In July 2020 she was found to have high-grade stricture involving gastrojejunal anastomosis not responding to balloon dilation.  Second balloon dilation was not attempted because she had food debris in her gastric  pouch. She also had colonoscopy in July 2020 without significant findings.  I am not able to review that report.  In September 2020 she had placement of axios stent [facility], This stent was removed in January 2021.       Past Medical History:  Diagnosis Date  . Depression    started Lexapro when husband passed away  . Hypertension   . Hypothyroidism   . IBS (irritable bowel syndrome)   . PONV (postoperative nausea and vomiting)         Morbid obesity; status post gastric bypass in 2013.       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       Axios stent placed in September 2020 and [facility]       History of vitamin D deficiency. Past Surgical History:  Procedure Laterality Date  . ABDOMINAL HYSTERECTOMY  08/11/2011   Procedure: HYSTERECTOMY ABDOMINAL;  Surgeon: Tilda BurrowJohn V Ferguson, MD;  Location: [facility];  Service: Gynecology;  Laterality: N/A;  . CESAREAN SECTION     x 2  . CHOLECYSTECTOMY  2009   [facility]  . ESOPHAGOGASTRODUODENOSCOPY  Jan 2004   RMR: normal esophagus, small hiatal hernia, normal D1, D2  . FOOT SURGERY     x 5 right  . GASTRIC BYPASS    . HAND SURGERY     x2 right  . HEMATOMA EVACUATION  08/14/2011   Procedure: EVACUATION HEMATOMA;  Surgeon: [name], MD;  Location: [facility];  Service: Gynecology;  Laterality: N/A;  . SALPINGOOPHORECTOMY  08/11/2011   Procedure: SALPINGO OOPHERECTOMY;  Surgeon: [name], MD;  Location: [facility];  Service: Gynecology;  Laterality: Bilateral;  . SCAR REVISION  08/11/2011   Procedure: SCAR REVISION;  Surgeon: [name], MD;  Location: [facility];  Service: Gynecology;  Laterality: N/A;  Excision of Wide Cicatrix    Prior to Admission medications   Medication Sig Start Date End Date Taking? Authorizing Provider  ALPRAZolam Prudy Feeler) 0.5 MG tablet Take 0.5 mg by mouth at bedtime. *May take up to 4 times daily as needed For anxiety and sleep   Yes [provider]  BIOTIN PO Take 1 tablet by mouth daily.   Yes [provider]  CALCIUM PO Take 1 tablet by mouth daily.   Yes [provider]  CRANBERRY FRUIT PO Take 1 tablet by mouth daily.   Yes [provider]  Cyanocobalamin (B-12 PO) Take 1 tablet by mouth daily.   Yes [provider]  diphenhydrAMINE (BENADRYL) 25 mg capsule Take 25 mg by mouth 3 (three) times daily as needed for itching or allergies.   Yes [provider]  ferrous sulfate 325 (65 FE) MG tablet Take 325 mg by mouth daily.   Yes [provider]  levothyroxine (SYNTHROID, LEVOTHROID) 125 MCG tablet Take 125 mcg by mouth daily before breakfast.  06/11/15  Yes [provider]  misoprostol (CYTOTEC) 100 MCG tablet Take 100 mcg by mouth 4 (four) times daily.  09/30/18  Yes [provider]  Multiple Vitamin (MULTIVITAMIN WITH MINERALS) TABS tablet Take 2 tablets by mouth daily.   Yes [provider]  omeprazole (PRILOSEC) 20 MG capsule Take 20 mg by mouth daily as needed (stomach acid).  09/30/18  Yes [provider]  promethazine (PHENERGAN) 25 MG tablet Take 25 mg by mouth every 6 (six) hours as needed. For nausea   Yes [provider]  thiamine (VITAMIN B-1) 100 MG tablet Take 100 mg by mouth daily.   Yes [provider]  traMADol (ULTRAM) 50 MG tablet Take 50 mg by mouth every 4 (four) hours as needed for pain. 01/09/20  Yes [provider]  Vitamin D, Ergocalciferol, (DRISDOL) 1.25 MG (50000 UNIT) CAPS capsule Take 50,000 Units by mouth once a week. 08/29/19  Yes [provider]  dicyclomine (BENTYL) 20 MG tablet Take 1 tablet (20 mg total) by mouth 3 (three) times daily as needed. For diarrhea and cramping 06/09/11 06/19/15  Gelene Mink, NP    Current Facility-Administered Medications  Medication Dose Route Frequency Provider Last Rate Last Admin  . 0.9 %  sodium chloride infusion  10 mL/hr Intravenous  Once Johnson, Clanford L, MD      . acetaminophen (TYLENOL) tablet 650 mg  650 mg Oral Q6H PRN Johnson, Clanford L, MD       Or  . acetaminophen (TYLENOL) suppository 650 mg  650 mg Rectal Q6H PRN Johnson, Clanford L, MD      . ALPRAZolam Prudy Feeler) tablet 0.5 mg  0.5 mg Oral TID PRN Johnson, Clanford L, MD      . bisacodyl (DULCOLAX) EC tablet 5 mg  5 mg Oral Daily PRN Laural Benes, Clanford L, MD      . Melene Muller ON 01/11/2020] calcium carbonate (TUMS - dosed in mg elemental calcium) chewable tablet 200 mg of elemental calcium  1 tablet Oral Daily Johnson, Clanford L,  MD      . diphenhydrAMINE (BENADRYL) capsule 25 mg  25 mg Oral TID PRN Laural Benes, Clanford L, MD      . Melene Muller ON 01/11/2020] ferrous sulfate tablet 325 mg  325 mg Oral Q breakfast Johnson, Clanford L, MD      . Melene Muller ON 01/11/2020] levothyroxine (SYNTHROID) tablet 125 mcg  125 mcg Oral QAC breakfast Johnson, Clanford L, MD      . metoprolol tartrate (LOPRESSOR) injection 2.5 mg  2.5 mg Intravenous Q6H Johnson, Clanford L, MD      . multivitamin with minerals tablet 1 tablet  1 tablet Oral Daily Laural Benes, Clanford L, MD   1 tablet at 01/10/20 1628  . ondansetron (ZOFRAN) tablet 4 mg  4 mg Oral Q6H PRN Johnson, Clanford L, MD       Or  . ondansetron (ZOFRAN) injection 4 mg  4 mg Intravenous Q6H PRN Johnson, Clanford L, MD      . Melene Muller ON 01/11/2020] pantoprazole (PROTONIX) injection 40 mg  40 mg Intravenous Q24H Johnson, Clanford L, MD      . promethazine (PHENERGAN) tablet 25 mg  25 mg Oral Q6H PRN Johnson, Clanford L, MD      . thiamine (Vitamin B-1) tablet 100 mg  100 mg Oral Daily Johnson, Clanford L, MD   100 mg at 01/10/20 1628  . traMADol (ULTRAM) tablet 50 mg  50 mg Oral Q6H PRN Laural Benes, Clanford L, MD      . Melene Muller ON 01/11/2020] vitamin B-12 (CYANOCOBALAMIN) tablet 1,000 mcg  1,000 mcg Oral Daily Laural Benes, Clanford L, MD      . Melene Muller ON 01/11/2020] Vitamin D (Ergocalciferol) (DRISDOL) capsule 50,000 Units  50,000 Units Oral Weekly Johnson,  Clanford L, MD       Current Outpatient Medications  Medication Sig Dispense Refill  . ALPRAZolam (XANAX) 0.5 MG tablet Take 0.5 mg by mouth at bedtime. *May take up to 4 times daily as needed For anxiety and sleep    . BIOTIN PO Take 1 tablet by mouth daily.    Marland Kitchen CALCIUM PO Take 1 tablet by mouth daily.    Marland Kitchen CRANBERRY FRUIT PO Take 1 tablet by mouth daily.    . Cyanocobalamin (B-12 PO) Take 1 tablet by mouth daily.    . diphenhydrAMINE (BENADRYL) 25 mg capsule Take 25 mg by mouth 3 (three) times daily as needed for itching or allergies.    . ferrous sulfate 325 (65 FE) MG tablet Take 325 mg by mouth daily.    Marland Kitchen levothyroxine (SYNTHROID, LEVOTHROID) 125 MCG tablet Take 125 mcg by mouth daily before breakfast.     . misoprostol (CYTOTEC) 100 MCG tablet Take 100 mcg by mouth 4 (four) times daily.     . Multiple Vitamin (MULTIVITAMIN WITH MINERALS) TABS tablet Take 2 tablets by mouth daily.    Marland Kitchen omeprazole (PRILOSEC) 20 MG capsule Take 20 mg by mouth daily as needed (stomach acid).     . promethazine (PHENERGAN) 25 MG tablet Take 25 mg by mouth every 6 (six) hours as needed. For nausea    . thiamine (VITAMIN B-1) 100 MG tablet Take 100 mg by mouth daily.    . traMADol (ULTRAM) 50 MG tablet Take 50 mg by mouth every 4 (four) hours as needed for pain.    . Vitamin D, Ergocalciferol, (DRISDOL) 1.25 MG (50000 UNIT) CAPS capsule Take 50,000 Units by mouth once a week.    . dicyclomine (BENTYL) 20 MG tablet Take 1 tablet (  20 mg total) by mouth 3 (three) times daily as needed. For diarrhea and cramping 30 tablet 1    Allergies as of 01/10/2020 - Review Complete 01/10/2020  Allergen Reaction Noted  . Bee pollen Anaphylaxis 08/04/2011  . Hydrocodone-acetaminophen Itching 06/19/2015  . Chocolate Other (See Comments) 07/02/2011  . Nsaids Other (See Comments) 07/31/2011  . Synthroid [levothyroxine sodium] Itching and Swelling 06/09/2011  . Latex Rash 07/07/2011  . Neosporin [neomycin-bacitracin  zn-polymyx] Rash 07/07/2011    Family history.  Father died of ruptured aneurysm possibly thoracic.  Age unknown. Mother is 24 years old and doing well. She has 1 sister age 46 who is also in good health.  Social history.  Patient is widowed.  She lost her husband and 2005 because of Doreatha Martin disease. She has daughter age 29 and son aged 49 in good health. Patient is presently unemployed.  She worked as [employer] for 15 years.  Lately she has been working in a [employer].  She has never smoked cigarettes and does not drink alcohol.   Review of Systems: See HPI, otherwise normal ROS  Physical Exam: Temp:  [97.9 F (36.6 C)] 97.9 F (36.6 C) (12/01 0901) Pulse Rate:  [74-169] 105 (12/01 1730) Resp:  [11-26] 21 (12/01 1730) BP: (82-134)/(42-92) 132/92 (12/01 1730) SpO2:  [83 %-100 %] 100 % (12/01 1730) FiO2 (%):  [21 %] 21 % (12/01 1427)   Patient is alert and in no acute distress. Conjunctiva is pale.  Sclerae nonicteric. Oropharyngeal mucosa is normal.  Dentition in poor condition. Neck without masses adenopathy or thyromegaly. Cardiac exam with regular rhythm normal S1 and S2.  No murmur gallop noted. Auscultation lungs reveal vesicular breath sounds bilaterally. Abdomen is symmetrical.  Bowel sounds are normal.  She has laparoscopy scars across upper abdomen.  On palpation abdomen is soft and nontender with no organomegaly or masses. She does not have peripheral edema clubbing or koilonychia.   Total I/O In: 1000 [IV Piggyback:1000] Out: -   Lab Results: Recent Labs    01/10/20 1013  WBC 4.3  HGB 6.0*  HCT 21.9*  PLT 237   BMET Recent Labs    01/10/20 1013  NA 137  K 4.3  CL 109  CO2 23  GLUCOSE 121*  BUN 18  CREATININE 0.67  CALCIUM 7.7*   LFT Recent Labs    01/10/20 1013  PROT 4.6*  ALBUMIN 2.1*  AST 59*  ALT 45*  ALKPHOS 188*  BILITOT 0.2*   Serum iron 11 TIBC 317 and saturation 3% Serum ferritin 4. Vitamin B12 135.  Folate  17.4.   Studies/Results: DG Chest Port 1 View  Result Date: 01/10/2020 CLINICAL DATA:  Weakness and tachycardia. EXAM: PORTABLE CHEST 1 VIEW COMPARISON:  Chest radiograph April 29, 2011 FINDINGS: The heart size and mediastinal contours are within normal limits. Chronic parenchymal lung changes without focal consolidation. No visible pneumothorax. No pleural effusion. Thoracic spondylosis. IMPRESSION: No acute cardiopulmonary process. Electronically Signed   By: Maudry Mayhew MD   On: 01/10/2020 09:31    Assessment;  Patient is 59 year old Caucasian female with history of laparoscopic gastric bypass in 2013 for morbid obesity, history of vitamin D and  iron deficiency anemia who presents with profound anemia and noted to have heme positive stool.  She develop high-grade gastrojejunal anastomotic stricture last year unresponsive to balloon dilation eventually treated with stenting for 3 months.  She has not been taking iron supplement as recommended. She did have colonoscopy in July 2020  which was unremarkable or at least she did not have any polyps.  Patient is to receive 2 units of PRBCs today. Lab studies also pertinent for mildly elevated transaminases and alkaline phosphatase. GI issues can be summarized as below.  #1. iron deficiency anemia.  I suspect the main culprit is iron malabsorption given history of bariatric surgery.  Since stool is guaiac positive she could have anastomotic ulcer.  If upper GI tract is unremarkable should also review small bowel study.  #2.  B12 deficiency.  This is also contributing to her anemia.  I suspect B12 deficiency is due to malabsorption secondary to gastric bypass surgery.  She could also have pernicious anemia.  I agree with parenteral B12 therapy.  #3.  Abnormal LFTs.  She has mildly elevated transaminases and alkaline phosphatase.  Transaminases were normal in May 2020 when alkaline phosphatase was elevated at 198. She is status post cholecystectomy.   Need to rule out viral infection as well as primary biliary cholangitis among other things.   Recommendations;  Hold p.o. iron until endoscopic evaluation completed. Hepatitis B surface antigen, HCV antibody and mitochondrial antibody. Abdominal ultrasound. Esophagogastroduodenoscopy with jejunal biopsy to be performed by Dr. Jena Gauss on 01/11/2020. I would also recommend endoscopic placement of small bowel given capsule if no bleeding lesion noted on EGD. Patient can have clear liquids tonight and will need to be n.p.o. after midnight.   LOS: 0 days   Whittaker Lenis  01/10/2020, 5:56 PM

## 2020-01-10 NOTE — ED Triage Notes (Signed)
Pt brought in by EMS. Pt reports waking up feeling dizzy and heart beating fast. Ems report HR in the 170's. Pt was given adenosine 6mg  and then 12 mg reports that it went down momentarily to 120 then went back up. Pt alert and oriented . EKG given to Dr 

## 2020-01-11 ENCOUNTER — Observation Stay (HOSPITAL_COMMUNITY): Admitting: Anesthesiology

## 2020-01-11 ENCOUNTER — Inpatient Hospital Stay (HOSPITAL_COMMUNITY)

## 2020-01-11 ENCOUNTER — Other Ambulatory Visit: Payer: Self-pay

## 2020-01-11 ENCOUNTER — Encounter (HOSPITAL_COMMUNITY): Payer: Self-pay | Admitting: Family Medicine

## 2020-01-11 ENCOUNTER — Encounter (HOSPITAL_COMMUNITY)
Admission: EM | Disposition: A | Discharge status: Home / Self Care | Payer: Self-pay | Source: Home / Self Care | Attending: Family Medicine

## 2020-01-11 DIAGNOSIS — D649 Anemia, unspecified: Secondary | ICD-10-CM

## 2020-01-11 DIAGNOSIS — K921 Melena: Secondary | ICD-10-CM

## 2020-01-11 DIAGNOSIS — K922 Gastrointestinal hemorrhage, unspecified: Secondary | ICD-10-CM | POA: Diagnosis present

## 2020-01-11 HISTORY — PX: ESOPHAGOGASTRODUODENOSCOPY (EGD) WITH PROPOFOL: SHX5813

## 2020-01-11 HISTORY — PX: BIOPSY: SHX5522

## 2020-01-11 LAB — CBC
HCT: 27.1 % — ABNORMAL LOW (ref 36.0–46.0)
Hemoglobin: 8.2 g/dL — ABNORMAL LOW (ref 12.0–15.0)
MCH: 23.1 pg — ABNORMAL LOW (ref 26.0–34.0)
MCHC: 30.3 g/dL (ref 30.0–36.0)
MCV: 76.3 fL — ABNORMAL LOW (ref 80.0–100.0)
Platelets: 229 10*3/uL (ref 150–400)
RBC: 3.55 MIL/uL — ABNORMAL LOW (ref 3.87–5.11)
RDW: 17.3 % — ABNORMAL HIGH (ref 11.5–15.5)
WBC: 4.7 10*3/uL (ref 4.0–10.5)
nRBC: 0 % (ref 0.0–0.2)

## 2020-01-11 LAB — COMPREHENSIVE METABOLIC PANEL
ALT: 36 U/L (ref 0–44)
AST: 39 U/L (ref 15–41)
Albumin: 2.1 g/dL — ABNORMAL LOW (ref 3.5–5.0)
Alkaline Phosphatase: 173 U/L — ABNORMAL HIGH (ref 38–126)
Anion gap: 5 (ref 5–15)
BUN: 14 mg/dL (ref 6–20)
CO2: 23 mmol/L (ref 22–32)
Calcium: 8.1 mg/dL — ABNORMAL LOW (ref 8.9–10.3)
Chloride: 109 mmol/L (ref 98–111)
Creatinine, Ser: 0.69 mg/dL (ref 0.44–1.00)
GFR, Estimated: >60 mL/min (ref >60)
Glucose, Bld: 83 mg/dL (ref 70–99)
Potassium: 3.8 mmol/L (ref 3.5–5.1)
Sodium: 137 mmol/L (ref 135–145)
Total Bilirubin: 1 mg/dL (ref 0.3–1.2)
Total Protein: 4.8 g/dL — ABNORMAL LOW (ref 6.5–8.1)

## 2020-01-11 LAB — PHOSPHORUS: Phosphorus: 3.3 mg/dL (ref 2.5–4.6)

## 2020-01-11 LAB — RESP PANEL BY RT-PCR (FLU A&B, COVID) ARPGX2
Influenza A by PCR: NEGATIVE
Influenza B by PCR: NEGATIVE
SARS Coronavirus 2 by RT PCR: NEGATIVE

## 2020-01-11 LAB — MAGNESIUM: Magnesium: 1.9 mg/dL (ref 1.7–2.4)

## 2020-01-11 LAB — HEPATITIS C ANTIBODY: HCV Ab: NONREACTIVE

## 2020-01-11 LAB — HIV ANTIBODY (ROUTINE TESTING W REFLEX): HIV Screen 4th Generation wRfx: NONREACTIVE

## 2020-01-11 LAB — HEPATITIS B SURFACE ANTIGEN: Hepatitis B Surface Ag: NONREACTIVE

## 2020-01-11 LAB — PROTIME-INR
INR: 1.1 (ref 0.8–1.2)
Prothrombin Time: 13.7 seconds (ref 11.4–15.2)

## 2020-01-11 SURGERY — ESOPHAGOGASTRODUODENOSCOPY (EGD) WITH PROPOFOL
Anesthesia: General

## 2020-01-11 MED ORDER — STERILE WATER FOR IRRIGATION IR SOLN
Status: DC | PRN
  Administered 2020-01-11: 1.5 mL

## 2020-01-11 MED ORDER — SODIUM CHLORIDE 0.9 % IV SOLN: INTRAVENOUS | Status: DC

## 2020-01-11 MED ORDER — PANTOPRAZOLE SODIUM 40 MG PO TBEC
40.0000 mg | DELAYED_RELEASE_TABLET | Freq: Two times a day (BID) | ORAL | Status: DC
  Filled 2020-01-11 (×2): qty 1

## 2020-01-11 MED ORDER — METOCLOPRAMIDE HCL 5 MG/5ML PO SOLN
10.0000 mg | Freq: Three times a day (TID) | ORAL | Status: DC
  Administered 2020-01-11 – 2020-01-12 (×5): 10 mg via ORAL
  Filled 2020-01-11 (×5): qty 10

## 2020-01-11 MED ORDER — PANTOPRAZOLE SODIUM 40 MG PO TBEC
40.0000 mg | DELAYED_RELEASE_TABLET | Freq: Two times a day (BID) | ORAL | Status: DC
  Administered 2020-01-11 – 2020-01-12 (×3): 40 mg via ORAL
  Filled 2020-01-11 (×3): qty 1

## 2020-01-11 MED ORDER — PROPOFOL 10 MG/ML IV BOLUS
INTRAVENOUS | Status: DC | PRN
  Administered 2020-01-11: 100 mg via INTRAVENOUS
  Administered 2020-01-11: 150 ug/kg/min via INTRAVENOUS

## 2020-01-11 MED ORDER — LACTATED RINGERS IV SOLN: INTRAVENOUS | Status: DC | PRN

## 2020-01-11 NOTE — Transfer of Care (Signed)
Immediate Anesthesia Transfer of Care Note  Patient: [name]  Procedure(s) Performed: ESOPHAGOGASTRODUODENOSCOPY (EGD) WITH PROPOFOL (N/A ) BIOPSY  Patient Location: PACU  Anesthesia Type:General  Level of Consciousness: awake, alert , oriented and patient cooperative  Airway & Oxygen Therapy: Patient Spontanous Breathing  Post-op Assessment: Report given to RN, Post -op Vital signs reviewed and stable and Patient moving all extremities  Post vital signs: Reviewed and stable  Last Vitals:  Vitals Value Taken Time  BP    Temp    Pulse 99 01/11/20 1521  Resp 22 01/11/20 1521  SpO2 96 % 01/11/20 1521  Vitals shown include unvalidated device data.  Last Pain:  Vitals:   01/11/20 1504  TempSrc:   PainSc: 4       Patients Stated Pain Goal: 6 (01/11/20 1417)  Complications: No complications documented.

## 2020-01-11 NOTE — Interval H&P Note (Signed)
History and Physical Interval Note:  01/11/2020 3:02 PM  [name]  has presented today for surgery, with the diagnosis of Iron deficiency anemia.  Heme positive stool.  The various methods of treatment have been discussed with the patient and family. After consideration of risks, benefits and other options for treatment, the patient has consented to  Procedure(s) with comments: ESOPHAGOGASTRODUODENOSCOPY (EGD) WITH PROPOFOL (N/A) GIVENS CAPSULE STUDY (N/A) - Given capsule to be placed endoscopically passed gastrojejunal anastomosis if EGD is unremarkable. as a surgical intervention.  The patient's history has been reviewed, patient examined, no change in status, stable for surgery.  I have reviewed the patient's chart and labs.  Questions were answered to the patient's satisfaction.     Eula Listen  Patient seen in short stay.  Stable overnight multiple black stools overnight.  Hemoglobin 8.2 this morning AMA pending HCV, HBV B serologies negative  Plan for EGD today with possible therapeutic intent, small bowel biopsy and/or capsule placement.  This algorithm has been discussed with the patient at length. The risks, benefits, limitations, alternatives and imponderables have been reviewed with the patient. Potential for esophageal dilation, biopsy, etc. have also been reviewed.  Questions have been answered. All parties agreeable.   Further recommendations to follow.

## 2020-01-11 NOTE — Anesthesia Preprocedure Evaluation (Signed)
Anesthesia Evaluation  Patient identified by MRN, date of birth, ID band Patient awake    Reviewed: Allergy & Precautions, H&P , NPO status , Patient's Chart, lab work & pertinent test results, reviewed documented beta blocker date and time   History of Anesthesia Complications (+) PONV  Airway Mallampati: II  TM Distance: >3 FB Neck ROM: full    Dental no notable dental hx.    Pulmonary neg pulmonary ROS,    Pulmonary exam normal breath sounds clear to auscultation       Cardiovascular Exercise Tolerance: Good hypertension, negative cardio ROS   Rhythm:regular Rate:Normal     Neuro/Psych PSYCHIATRIC DISORDERS Anxiety Depression negative neurological ROS     GI/Hepatic negative GI ROS, Neg liver ROS,   Endo/Other  Hypothyroidism   Renal/GU negative Renal ROS  negative genitourinary   Musculoskeletal   Abdominal   Peds  Hematology  (+) Blood dyscrasia, anemia ,   Anesthesia Other Findings   Reproductive/Obstetrics negative OB ROS                             Anesthesia Physical Anesthesia Plan  ASA: III  Anesthesia Plan: General   Post-op Pain Management:    Induction:   PONV Risk Score and Plan: Propofol infusion  Airway Management Planned:   Additional Equipment:   Intra-op Plan:   Post-operative Plan:   Informed Consent: I have reviewed the patients History and Physical, chart, labs and discussed the procedure including the risks, benefits and alternatives for the proposed anesthesia with the patient or authorized representative who has indicated his/her understanding and acceptance.     Dental Advisory Given  Plan Discussed with: CRNA  Anesthesia Plan Comments:         Anesthesia Quick Evaluation

## 2020-01-11 NOTE — Progress Notes (Signed)
[facility] night shift telemetry coverage note.  The patient declined to take the levothyroxine due to a previous allergy to the inactive components of the medication.  She is able to take generic levothyroxine.  The staff told her that she was not getting brand-name Synthroid, but she still declined to take it stating "better safe than sorry".  We will try to explain again later and try to reassure her that she is not getting brand-name Synthroid.  Sanda Klein, MD

## 2020-01-11 NOTE — Op Note (Signed)
[facility] Patient Name: [name] Procedure Date: 01/11/2020 2:49 PM MRN: [id number] Date of Birth: 1960/09/07 Attending MD: Gennette Pac , MD CSN: [id number] Age: 59 Admit Type: Inpatient Procedure:                Upper GI endoscopy Indications:              Melena Providers:                Gennette Pac, MD, Angelica Ran, Criselda Peaches.                            Patsy Lager, RN, Pandora Leiter, Technician Referring MD:              Medicines:                Propofol per Anesthesia Complications:            No immediate complications. Estimated Blood Loss:     Estimated blood loss was minimal. Procedure:                Pre-Anesthesia Assessment:                           - Prior to the procedure, a History and Physical                            was performed, and patient medications and                            allergies were reviewed. The patient's tolerance of                            previous anesthesia was also reviewed. The risks                            and benefits of the procedure and the sedation                            options and risks were discussed with the patient.                            All questions were answered, and informed consent                            was obtained. Prior Anticoagulants: The patient has                            taken no previous anticoagulant or antiplatelet                            agents. ASA Grade Assessment: II - A patient with                            mild systemic disease. After reviewing the risks  and benefits, the patient was deemed in                            satisfactory condition to undergo the procedure.                           After obtaining informed consent, the endoscope was                            passed under direct vision. Throughout the                            procedure, the patient's blood pressure, pulse, and                            oxygen  saturations were monitored continuously. The                            [id number]-[id number] ([id number]) scope was introduced through the                            mouth, and advanced to the efferent jejunal loop.                            The upper GI endoscopy was accomplished without                            difficulty. The patient tolerated the procedure                            well. Scope In: 3:10:53 PM Scope Out: 3:17:54 PM Total Procedure Duration: 0 hours 7 minutes 1 second  Findings:      The examined esophagus was normal. Surgically altered stomach consistent       with prior bariatric surgery. Relatively small gastric remnant with       patchy erythema of the residual gastric mucosa. 4 mm polypoid nodule       adjacent to the anastomosis not manipulated. Patent anastomosis with       small bowel. At the anastomosis, there was nearly circumferential       ulceration of the mucosa. Rather deep cratered areas. Ulcer crater base       clean although margins were friable. Please see photos. Patent jejunal       limb intubated a good 20 to 25 cm which appeared normal.      Biopsies of the residual gastric mucosa were taken. Polyp was not       manipulated. No small bowel biopsies or capsule deployment performed Impression:               - Normal esophagus. Status post prior bariatric                            surgery.                           - Nearly circumferential, deep anastomotic  ulceration as described (likely source of GI                            bleeding/IDA). Surrounding friable mucosa. Gastric                            polyp not manipulated. Status post gastric biopsy. Moderate Sedation:      Moderate (conscious) sedation was personally administered by an       anesthesia professional. The following parameters were monitored: oxygen       saturation, heart rate, blood pressure, respiratory rate, EKG, adequacy       of pulmonary ventilation, and  response to care. Recommendation:           - Return patient to hospital ward for observation.                           - Advance diet as tolerated -low residue.                           - Continue present medications. Protonix 40 mg                            orally twice daily. Avoid all nonsteroidal agents.                            Follow-up on pathology. Plan for repeat EGD in 3                            months. Hopefully, home in the next 24 hours from a                            GI standpoint.. Procedure Code(s):        --- Professional ---                           [phone], Esophagogastroduodenoscopy, flexible,                            transoral; diagnostic, including collection of                            specimen(s) by brushing or washing, when performed                            (separate procedure) Diagnosis Code(s):        --- Professional ---                           K92.1, Melena (includes Hematochezia) CPT copyright 2019 American Medical Association. All rights reserved. The codes documented in this report are preliminary and upon coder review may  be revised to meet current compliance requirements. Gerrit Friends. Liat Mayol, MD Gennette Pac, MD 01/11/2020 3:33:38 PM This report has been signed electronically. Number of Addenda: 0

## 2020-01-11 NOTE — ED Notes (Addendum)
Went to give pt synthroid and she stated that she is allergic. The medication was not given and I notified the hospitalist about it. Med was charted as not given.

## 2020-01-11 NOTE — Anesthesia Postprocedure Evaluation (Signed)
Anesthesia Post Note  Patient: [name]  Procedure(s) Performed: ESOPHAGOGASTRODUODENOSCOPY (EGD) WITH PROPOFOL (N/A ) BIOPSY  Patient location during evaluation: PACU Anesthesia Type: General Level of consciousness: awake, oriented, awake and alert and patient cooperative Pain management: pain level controlled Vital Signs Assessment: post-procedure vital signs reviewed and stable Respiratory status: spontaneous breathing, respiratory function stable and nonlabored ventilation Cardiovascular status: blood pressure returned to baseline and stable Postop Assessment: no headache and no backache Anesthetic complications: no   No complications documented.   Last Vitals:  Vitals:   01/11/20 1248 01/11/20 1417  BP: (!) 148/60 (!) 141/82  Pulse: 96 88  Resp: 18 15  Temp: 36.7 C 36.7 C  SpO2: 100% 97%    Last Pain:  Vitals:   01/11/20 1504  TempSrc:   PainSc: 4                  Brynda Peon

## 2020-01-11 NOTE — Progress Notes (Signed)
PROGRESS NOTE   UDELL BLASINGAME  ZCH:[id number] DOB: [birth year] DOA: 01/10/2020 PCP: Assunta Found, MD   Chief Complaint  Patient presents with  . Tachycardia    Brief Admission History:  [name] is a 59 y.o. female with medical history significant gastric bypass surgery about 5 years ago complicated by subsequently developing gastric ulcers and esophageal stricture about 1 year after the procedure.  She weighed nearly 400 pounds when she had her gastric bypass and has lost a significant amount of weight with her lowest weight being near 120 pounds.  She was treated with esophageal stent placement which has now been removed and was supposed to be taking PPI therapy but she is not taking it regularly.  She was using heavy NSAIDs in the past but denies using them now.  She does take some vitamins.    She reports having palpitations that started early this morning associated with dizziness and shortness of breath.  EMS was called and apparently she was given adenosine 6 mg and 12 mg for presumed SVT and had some initial improvement in heart rate but unfortunately heart rate increased again in the ED temporarily but then improved afterwards.  Lab work was ordered revealing a Hg of 6.0.  Pt says that she has a history of iron deficiency.  She reports black stools only when taking pepto-bismol occasionally.  She reports that she was told that she had ulcers but has not followed up with GI at [facility] in quite a while.  Her stool was tested by ED provider as hemoccult positive.  She has had a total hysterectomy and denies menorrhagia.   She is being typed and crossed to transfuse 2 units PRBCs.  She is being started on IV protonix.  She is being admitted for further management.  Assessment & Plan:   Principal Problem:   Symptomatic anemia Active Problems:   IBS (irritable bowel syndrome)   Heme positive stool   Vitamin B12 deficiency   Iron deficiency anemia   Sinus tachycardia    Hypothyroidism   S/P hysterectomy with oophorectomy   Hypertension   Generalized anxiety disorder   S/P gastric bypass   Multiple vitamin deficiency   Acute upper GI bleed   Upper GI bleeding - Pt has a large ulcer - continue protonix ordered by GI.  Advance diet.  Anticipate DC in next 24 hours if ok with GI team.   DVT prophylaxis: SCD Code Status:  Full  Family Communication:  Disposition: home  Status is: Inpatient  Remains inpatient appropriate because:Inpatient level of care appropriate due to severity of illness   Dispo: The patient is from: Home              Anticipated d/c is to: Home              Anticipated d/c date is: 1 day              Patient currently is not medically stable to d/c.   Consultants:   GI  Procedures:   EGD 12/2  Antimicrobials:    Subjective: Pt seen prior to EGD. Says she had bloody bowel movement yesterday.   Objective: Vitals:   01/11/20 1520 01/11/20 1530 01/11/20 1545 01/11/20 1604  BP: (!) 99/50 112/64 116/72 (!) 141/70  Pulse: 99 91 80 84  Resp: 12 13 11 18   Temp: 98.8 F (37.1 C)   98.8 F (37.1 C)  TempSrc:      SpO2: 98% 100%  99%   Weight:      Height:        Intake/Output Summary (Last 24 hours) at 01/11/2020 1903 Last data filed at 01/11/2020 1513 Gross per 24 hour  Intake 2616.25 ml  Output --  Net 2616.25 ml   Filed Weights   01/11/20 1248 01/11/20 1417  Weight: 71.8 kg 71.2 kg    Examination:  General exam: Appears calm and comfortable  Respiratory system: Clear to auscultation. Respiratory effort normal. Cardiovascular system: S1 & S2 heard, RRR. No JVD, murmurs, rubs, gallops or clicks. No pedal edema. Gastrointestinal system: Abdomen is nondistended, soft and nontender. No organomegaly or masses felt. Normal bowel sounds heard. Central nervous system: Alert and oriented. No focal neurological deficits. Extremities: Symmetric 5 x 5 power. Skin: No rashes, lesions or ulcers Psychiatry: Judgement  and insight appear normal. Mood & affect appropriate.   Data Reviewed: I have personally reviewed following labs and imaging studies  CBC: Recent Labs  Lab 01/10/20 1013 01/11/20 0521  WBC 4.3 4.7  NEUTROABS 3.4  --   HGB 6.0* 8.2*  HCT 21.9* 27.1*  MCV 79.1* 76.3*  PLT 237 229    Basic Metabolic Panel: Recent Labs  Lab 01/10/20 1013 01/11/20 0521  NA 137 137  K 4.3 3.8  CL 109 109  CO2 23 23  GLUCOSE 121* 83  BUN 18 14  CREATININE 0.67 0.69  CALCIUM 7.7* 8.1*  MG  --  1.9  PHOS  --  3.3    GFR: Estimated Creatinine Clearance: 80.9 mL/min (by C-G formula based on SCr of 0.69 mg/dL).  Liver Function Tests: Recent Labs  Lab 01/10/20 1013 01/11/20 0521  AST 59* 39  ALT 45* 36  ALKPHOS 188* 173*  BILITOT 0.2* 1.0  PROT 4.6* 4.8*  ALBUMIN 2.1* 2.1*    CBG: No results for input(s): GLUCAP in the last 168 hours.  Recent Results (from the past 240 hour(s))  Resp Panel by RT-PCR (Flu A&B, Covid) Nasopharyngeal Swab     Status: None   Collection Time: 01/11/20  8:08 AM   Specimen: Nasopharyngeal Swab; Nasopharyngeal(NP) swabs in vial transport medium  Result Value Ref Range Status   SARS Coronavirus 2 by RT PCR NEGATIVE NEGATIVE Final    Comment: (NOTE) SARS-CoV-2 target nucleic acids are NOT DETECTED.  The SARS-CoV-2 RNA is generally detectable in upper respiratory specimens during the acute phase of infection. The lowest concentration of SARS-CoV-2 viral copies this assay can detect is 138 copies/mL. A negative result does not preclude SARS-Cov-2 infection and should not be used as the sole basis for treatment or other patient management decisions. A negative result may occur with  improper specimen collection/handling, submission of specimen other than nasopharyngeal swab, presence of viral mutation(s) within the areas targeted by this assay, and inadequate number of viral copies(<138 copies/mL). A negative result must be combined with clinical  observations, patient history, and epidemiological information. The expected result is Negative.  Fact Sheet for Patients:  BloggerCourse.com  Fact Sheet for Healthcare Providers:  SeriousBroker.it  This test is no t yet approved or cleared by the [place] FDA and  has been authorized for detection and/or diagnosis of SARS-CoV-2 by FDA under an Emergency Use Authorization (EUA). This EUA will remain  in effect (meaning this test can be used) for the duration of the COVID-19 declaration under Section 564(b)(1) of the Act, 21 U.S.C.section 360bbb-3(b)(1), unless the authorization is terminated  or revoked sooner.       Influenza  A by PCR NEGATIVE NEGATIVE Final   Influenza B by PCR NEGATIVE NEGATIVE Final    Comment: (NOTE) The Xpert Xpress SARS-CoV-2/FLU/RSV plus assay is intended as an aid in the diagnosis of influenza from Nasopharyngeal swab specimens and should not be used as a sole basis for treatment. Nasal washings and aspirates are unacceptable for Xpert Xpress SARS-CoV-2/FLU/RSV testing.  Fact Sheet for Patients: BloggerCourse.comhttps://www.fda.gov/media/152166/download  Fact Sheet for Healthcare Providers: SeriousBroker.ithttps://www.fda.gov/media/152162/download  This test is not yet approved or cleared by the [place] FDA and has been authorized for detection and/or diagnosis of SARS-CoV-2 by FDA under an Emergency Use Authorization (EUA). This EUA will remain in effect (meaning this test can be used) for the duration of the COVID-19 declaration under Section 564(b)(1) of the Act, 21 U.S.C. section 360bbb-3(b)(1), unless the authorization is terminated or revoked.  Performed at [facility], [address] [place], [place] [id number]      Radiology Studies: US Abdomen Complete  Result Date: 01/11/2020 CLINICAL DATA:  Elevated liver function tests. EXAM: ABDOMEN ULTRASOUND COMPLETE COMPARISON:  August 23, 2007. FINDINGS: Gallbladder:  Status post cholecystectomy. Common bile duct: Diameter: 3 mm which is within normal limits. Liver: No focal lesion identified. Increased echogenicity of hepatic parenchyma is noted suggesting hepatic steatosis. Portal vein is patent on color Doppler imaging with normal direction of blood flow towards the liver. IVC: No abnormality visualized. Pancreas: Visualized portion unremarkable. Spleen: Size and appearance within normal limits. Right Kidney: Length: 10.2 cm. 4.7 cm exophytic cyst is seen arising from upper pole of right kidney. Echogenicity within normal limits. No mass or hydronephrosis visualized. Left Kidney: Length: 9.7 cm. 6 cm simple cyst is seen arising from upper pole. 3.6 cm septated cyst is seen in midpole. Echogenicity within normal limits. No mass or hydronephrosis visualized. Abdominal aorta: No aneurysm visualized. Other findings: None. IMPRESSION: Status post cholecystectomy. Increased echogenicity of hepatic parenchyma is noted suggesting hepatic steatosis. 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 Follow-up ultrasound in 1 year is recommended to ensure stability. Electronically Signed   By: Lupita RaiderJames  Green Jr M.D.   On: 01/11/2020 15:26   DG Chest Port 1 View  Result Date: 01/10/2020 CLINICAL DATA:  Weakness and tachycardia. EXAM: PORTABLE CHEST 1 VIEW COMPARISON:  Chest radiograph April 29, 2011 FINDINGS: The heart size and mediastinal contours are within normal limits. Chronic parenchymal lung changes without focal consolidation. No visible pneumothorax. No pleural effusion. Thoracic spondylosis. IMPRESSION: No acute cardiopulmonary process. Electronically Signed   By: Maudry MayhewJeffrey  Waltz MD   On: 01/10/2020 09:31     Scheduled Meds: . calcium carbonate  1 tablet Oral Daily  . levothyroxine  125 mcg Oral QAC breakfast  . metoCLOPramide  10 mg Oral TID AC & HS  . metoprolol tartrate  2.5 mg Intravenous Q6H  . multivitamin with  minerals  1 tablet Oral Daily  . pantoprazole  40 mg Oral BID AC  . thiamine  100 mg Oral Daily  . vitamin B-12  1,000 mcg Oral Daily  . Vitamin D (Ergocalciferol)  50,000 Units Oral Weekly   Continuous Infusions: . sodium chloride Stopped (01/10/20 1200)     LOS: 1 day   Time spent: 36 mins   Vanessa Kampf Laural BenesJohnson, MD How to contact the [facility] Attending or Consulting provider 7A - 7P or covering provider during after hours 7P -7A, for this patient?  1. Check the care team in [facility] and look for a) attending/consulting  [facility] provider listed and b) the [facility] team listed 2. Log into [email or web address] and use [facility]'s universal password to access. If you do not have the password, please contact the hospital operator. 3. Locate the [facility] provider you are looking for under [facility] and page to a number that you can be directly reached. 4. If you still have difficulty reaching the provider, please page the [facility] (Director on Call) for the Hospitalists listed on amion for assistance.  01/11/2020, 7:03 PM

## 2020-01-12 ENCOUNTER — Telehealth: Payer: Self-pay | Admitting: Nurse Practitioner

## 2020-01-12 ENCOUNTER — Encounter (HOSPITAL_COMMUNITY): Payer: Self-pay | Admitting: Internal Medicine

## 2020-01-12 DIAGNOSIS — K922 Gastrointestinal hemorrhage, unspecified: Secondary | ICD-10-CM

## 2020-01-12 LAB — CBC
HCT: 25.2 % — ABNORMAL LOW (ref 36.0–46.0)
Hemoglobin: 7.5 g/dL — ABNORMAL LOW (ref 12.0–15.0)
MCH: 23 pg — ABNORMAL LOW (ref 26.0–34.0)
MCHC: 29.8 g/dL — ABNORMAL LOW (ref 30.0–36.0)
MCV: 77.3 fL — ABNORMAL LOW (ref 80.0–100.0)
Platelets: 211 10*3/uL (ref 150–400)
RBC: 3.26 MIL/uL — ABNORMAL LOW (ref 3.87–5.11)
RDW: 17.6 % — ABNORMAL HIGH (ref 11.5–15.5)
WBC: 3.3 10*3/uL — ABNORMAL LOW (ref 4.0–10.5)
nRBC: 0 % (ref 0.0–0.2)

## 2020-01-12 LAB — COMPREHENSIVE METABOLIC PANEL
ALT: 32 U/L (ref 0–44)
AST: 30 U/L (ref 15–41)
Albumin: 2.1 g/dL — ABNORMAL LOW (ref 3.5–5.0)
Alkaline Phosphatase: 171 U/L — ABNORMAL HIGH (ref 38–126)
Anion gap: 7 (ref 5–15)
BUN: 9 mg/dL (ref 6–20)
CO2: 23 mmol/L (ref 22–32)
Calcium: 8 mg/dL — ABNORMAL LOW (ref 8.9–10.3)
Chloride: 109 mmol/L (ref 98–111)
Creatinine, Ser: 0.67 mg/dL (ref 0.44–1.00)
GFR, Estimated: >60 mL/min (ref >60)
Glucose, Bld: 86 mg/dL (ref 70–99)
Potassium: 3.4 mmol/L — ABNORMAL LOW (ref 3.5–5.1)
Sodium: 139 mmol/L (ref 135–145)
Total Bilirubin: 0.9 mg/dL (ref 0.3–1.2)
Total Protein: 4.8 g/dL — ABNORMAL LOW (ref 6.5–8.1)

## 2020-01-12 LAB — PREPARE RBC (CROSSMATCH)

## 2020-01-12 LAB — HEMOGLOBIN AND HEMATOCRIT, BLOOD
HCT: 29.7 % — ABNORMAL LOW (ref 36.0–46.0)
Hemoglobin: 8.9 g/dL — ABNORMAL LOW (ref 12.0–15.0)

## 2020-01-12 LAB — PHOSPHORUS: Phosphorus: 3.5 mg/dL (ref 2.5–4.6)

## 2020-01-12 LAB — MAGNESIUM: Magnesium: 1.9 mg/dL (ref 1.7–2.4)

## 2020-01-12 LAB — MITOCHONDRIAL ANTIBODIES: Mitochondrial M2 Ab, IgG: <20 Units (ref 0.0–20.0)

## 2020-01-12 MED ORDER — METOCLOPRAMIDE HCL 5 MG/5ML PO SOLN
10.0000 mg | Freq: Three times a day (TID) | ORAL | 0 refills | Status: DC
Start: 2020-01-12 — End: 2020-05-21

## 2020-01-12 MED ORDER — CYANOCOBALAMIN 1000 MCG PO TABS
1000.0000 ug | ORAL_TABLET | Freq: Every day | ORAL | 3 refills | Status: DC
Start: 2020-01-13 — End: 2022-01-02

## 2020-01-12 MED ORDER — SODIUM CHLORIDE 0.9% IV SOLUTION: Freq: Once | INTRAVENOUS | Status: AC

## 2020-01-12 MED ORDER — PANTOPRAZOLE SODIUM 40 MG PO TBEC
40.0000 mg | DELAYED_RELEASE_TABLET | Freq: Two times a day (BID) | ORAL | 3 refills | Status: DC
Start: 2020-01-12 — End: 2020-05-21

## 2020-01-12 MED ORDER — POTASSIUM CHLORIDE CRYS ER 20 MEQ PO TBCR
40.0000 meq | EXTENDED_RELEASE_TABLET | Freq: Once | ORAL | Status: AC
  Administered 2020-01-12: 40 meq via ORAL
  Filled 2020-01-12: qty 2

## 2020-01-12 NOTE — Plan of Care (Signed)

## 2020-01-12 NOTE — Discharge Instructions (Signed)

## 2020-01-12 NOTE — Progress Notes (Signed)
Removed IV, clean, dry, intact, pt tolerated well, reviewed DC paperwork, pt understood. Wheeled stable pt and belongings to main entrance where she was picked up by her friend

## 2020-01-12 NOTE — Progress Notes (Signed)
Discussed patient case with Dr. Laural Benes of the hospitalist service. Mild drif tof hgb (0.7 g) overnight, likely equilibration. Patient wanting to go home. He plans to give her 1 more unit PRBC and d/c today.  We will plan for outpatient follow-up in our office in 4-6 weeks. Will need repeat EGD, will follow for biopsy results and treat if needed.  We will sign off for now, will remove consult order. Please contact us and enter a new consult order if we can be of further assistance.   Thank you for allowing [place] to participate in the care of Tracy O Asencion Noble, DNP, AGNP-C Adult & Gerontological Nurse Practitioner [facility]

## 2020-01-12 NOTE — Plan of Care (Signed)
  Problem: Education: Goal: Knowledge of General Education information will improve Description Including pain rating scale, medication(s)/side effects and non-pharmacologic comfort measures Outcome: Progressing   Problem: Health Behavior/Discharge Planning: Goal: Ability to manage health-related needs will improve Outcome: Progressing   

## 2020-01-12 NOTE — Discharge Summary (Signed)
Physician Discharge Summary  [name] PFX:[id number] DOB: [birth year] DOA: 01/10/2020  PCP: Tracy Found, MD GI: [facility]   Admit date: 01/10/2020 Discharge date: 01/12/2020  Admitted From:  Home  Disposition: Home   Recommendations for Outpatient Follow-up:  1. Follow up with PCP in 1 weeks 2. Please give B12 injections in office to treat B12 deficiency.  1st injection given in hospital  3. Please follow CBC and B12 levels on outpatient follow up.   Discharge Condition: STABLE   CODE STATUS: FULL    Brief Hospitalization Summary: Please see all hospital notes, images, labs for full details of the hospitalization. ADMISSION HPI: [name] is a 59 y.o. female with medical history significant gastric bypass surgery about 5 years ago complicated by subsequently developing gastric ulcers and esophageal stricture about 1 year after the procedure.  She weighed nearly 400 pounds when she had her gastric bypass and has lost a significant amount of weight with her lowest weight being near 120 pounds.  She was treated with esophageal stent placement which has now been removed and was supposed to be taking PPI therapy but she is not taking it regularly.  She was using heavy NSAIDs in the past but denies using them now.  She does take some vitamins.    She reports having palpitations that started early this morning associated with dizziness and shortness of breath.  EMS was called and apparently she was given adenosine 6 mg and 12 mg for presumed SVT and had some initial improvement in heart rate but unfortunately heart rate increased again in the ED temporarily but then improved afterwards.  Lab work was ordered revealing a Hg of 6.0.  Pt says that she has a history of iron deficiency.  She reports black stools only when taking pepto-bismol occasionally.  She reports that she was told that she had ulcers but has not followed up with GI at [facility] in quite a while.  Her stool was tested by  ED provider as hemoccult positive.  She has had a total hysterectomy and denies menorrhagia.   She is being typed and crossed to transfuse 2 units PRBCs.  She is being started on IV protonix.  She is being admitted for further management.   Hospital Course  Pt was admitted for acute upper GI bleeding with hemoccult positive stools.  She was seen by our GI service and had EGD performed 01/11/20 by Dr. Kendell Bane with findings below:   The examined esophagus was normal. Surgically altered stomach consistent with prior bariatric surgery. Relatively small gastric remnant with patchy erythema of the residual gastric mucosa. 4 mm polypoid nodule adjacent to the anastomosis not manipulated. Patent anastomosis with small bowel. At the anastomosis, there was nearly circumferential ulceration of the mucosa. Rather deep cratered areas. Ulcer crater base clean although margins were friable. Please see photos. Patent jejunal limb intubated a good 20 to 25 cm which appeared normal. Biopsies of the residual gastric mucosa were taken. Polyp was not manipulated. No small bowel biopsies or capsule deployment performed  Findings: - Normal esophagus. Status post prior bariatric surgery. - Nearly circumferential, deep anastomotic ulceration as described (likely source of GI bleeding/IDA). Surrounding friable mucosa. Gastric polyp not manipulated. Status post gastric biopsy.  Pt had been transfused 3 units of PRBC to supplement after her GI blood losses. She tolerated them well and is feeling much better. Her work up did reveal that she had  B12 deficiency.  She was supplemented with a  B12 injection and restarted on oral B12 which she had not been taking.  She was given advice to follow up with her PCP to continue weekly B12 injections x 3 and then monthly B12 injections.  She verbalized understanding.  From a medical and GI standpoint she can discharge home with follow up.  GI is arranging follow up for her to have a  repeat EGD in 4-6 weeks to ensure resolution of the large gastric ulcer.    Discharge Diagnoses:  Principal Problem:   Symptomatic anemia Active Problems:   IBS (irritable bowel syndrome)   Heme positive stool   Vitamin B12 deficiency   Iron deficiency anemia   Sinus tachycardia   Hypothyroidism   S/P hysterectomy with oophorectomy   Hypertension   Generalized anxiety disorder   S/P gastric bypass   Multiple vitamin deficiency   Acute upper GI bleed  Discharge Instructions:  Allergies as of 01/12/2020      Reactions   Bee Pollen Anaphylaxis   Hydrocodone-acetaminophen Itching   Chocolate Other (See Comments)   Reaction: causes facial blistering,inside mouth   Nsaids Other (See Comments)   vomiting   Synthroid [levothyroxine Sodium] Itching, Swelling   Patient has a reaction to brand name synthroid only   Latex Rash   Neosporin [neomycin-bacitracin Zn-polymyx] Rash      Medication List    STOP taking these medications   omeprazole 20 MG capsule Commonly known as: PRILOSEC     TAKE these medications   ALPRAZolam 0.5 MG tablet Commonly known as: XANAX Take 0.5 mg by mouth at bedtime. *May take up to 4 times daily as needed For anxiety and sleep   BIOTIN PO Take 1 tablet by mouth daily.   CALCIUM PO Take 1 tablet by mouth daily.   CRANBERRY FRUIT PO Take 1 tablet by mouth daily.   cyanocobalamin 1000 MCG tablet Take 1 tablet (1,000 mcg total) by mouth daily. Start taking on: January 13, 2020 What changed:   medication strength  how much to take   dicyclomine 20 MG tablet Commonly known as: Bentyl Take 1 tablet (20 mg total) by mouth 3 (three) times daily as needed. For diarrhea and cramping   diphenhydrAMINE 25 mg capsule Commonly known as: BENADRYL Take 25 mg by mouth 3 (three) times daily as needed for itching or allergies.   ferrous sulfate 325 (65 FE) MG tablet Take 325 mg by mouth daily.   levothyroxine 125 MCG tablet Commonly known as:  SYNTHROID Take 125 mcg by mouth daily before breakfast.   metoCLOPramide 5 MG/5ML solution Commonly known as: REGLAN Take 10 mLs (10 mg total) by mouth 4 (four) times daily -  before meals and at bedtime.   misoprostol 100 MCG tablet Commonly known as: CYTOTEC Take 100 mcg by mouth 4 (four) times daily.   multivitamin with minerals Tabs tablet Take 2 tablets by mouth daily.   pantoprazole 40 MG tablet Commonly known as: PROTONIX Take 1 tablet (40 mg total) by mouth 2 (two) times daily before a meal.   promethazine 25 MG tablet Commonly known as: PHENERGAN Take 25 mg by mouth every 6 (six) hours as needed. For nausea   thiamine 100 MG tablet Commonly known as: Vitamin B-1 Take 100 mg by mouth daily.   traMADol 50 MG tablet Commonly known as: ULTRAM Take 50 mg by mouth every 4 (four) hours as needed for pain.   Vitamin D (Ergocalciferol) 1.25 MG (50000 UNIT) Caps capsule Commonly known as: DRISDOL Take  50,000 Units by mouth once a week.       Follow-up Information    Tracy Found, MD. Schedule an appointment as soon as possible for a visit in 1 week(s).   Specialty: Family Medicine Why: Hospital Follow Up and B12 injections Contact information: [address] [place] [address] [phone]              Allergies  Allergen Reactions  . Bee Pollen Anaphylaxis  . Hydrocodone-Acetaminophen Itching  . Chocolate Other (See Comments)    Reaction: causes facial blistering,inside mouth  . Nsaids Other (See Comments)    vomiting  . Synthroid [Levothyroxine Sodium] Itching and Swelling    Patient has a reaction to brand name synthroid only  . Latex Rash  . Neosporin [Neomycin-Bacitracin Zn-Polymyx] Rash   Allergies as of 01/12/2020      Reactions   Bee Pollen Anaphylaxis   Hydrocodone-acetaminophen Itching   Chocolate Other (See Comments)   Reaction: causes facial blistering,inside mouth   Nsaids Other (See Comments)   vomiting   Synthroid  [levothyroxine Sodium] Itching, Swelling   Patient has a reaction to brand name synthroid only   Latex Rash   Neosporin [neomycin-bacitracin Zn-polymyx] Rash      Medication List    STOP taking these medications   omeprazole 20 MG capsule Commonly known as: PRILOSEC     TAKE these medications   ALPRAZolam 0.5 MG tablet Commonly known as: XANAX Take 0.5 mg by mouth at bedtime. *May take up to 4 times daily as needed For anxiety and sleep   BIOTIN PO Take 1 tablet by mouth daily.   CALCIUM PO Take 1 tablet by mouth daily.   CRANBERRY FRUIT PO Take 1 tablet by mouth daily.   cyanocobalamin 1000 MCG tablet Take 1 tablet (1,000 mcg total) by mouth daily. Start taking on: January 13, 2020 What changed:   medication strength  how much to take   dicyclomine 20 MG tablet Commonly known as: Bentyl Take 1 tablet (20 mg total) by mouth 3 (three) times daily as needed. For diarrhea and cramping   diphenhydrAMINE 25 mg capsule Commonly known as: BENADRYL Take 25 mg by mouth 3 (three) times daily as needed for itching or allergies.   ferrous sulfate 325 (65 FE) MG tablet Take 325 mg by mouth daily.   levothyroxine 125 MCG tablet Commonly known as: SYNTHROID Take 125 mcg by mouth daily before breakfast.   metoCLOPramide 5 MG/5ML solution Commonly known as: REGLAN Take 10 mLs (10 mg total) by mouth 4 (four) times daily -  before meals and at bedtime.   misoprostol 100 MCG tablet Commonly known as: CYTOTEC Take 100 mcg by mouth 4 (four) times daily.   multivitamin with minerals Tabs tablet Take 2 tablets by mouth daily.   pantoprazole 40 MG tablet Commonly known as: PROTONIX Take 1 tablet (40 mg total) by mouth 2 (two) times daily before a meal.   promethazine 25 MG tablet Commonly known as: PHENERGAN Take 25 mg by mouth every 6 (six) hours as needed. For nausea   thiamine 100 MG tablet Commonly known as: Vitamin B-1 Take 100 mg by mouth daily.   traMADol 50 MG  tablet Commonly known as: ULTRAM Take 50 mg by mouth every 4 (four) hours as needed for pain.   Vitamin D (Ergocalciferol) 1.25 MG (50000 UNIT) Caps capsule Commonly known as: DRISDOL Take 50,000 Units by mouth once a week.      Procedures/Studies: US Abdomen Complete  Result Date: 01/11/2020 CLINICAL DATA:  Elevated liver function tests. EXAM: ABDOMEN ULTRASOUND COMPLETE COMPARISON:  August 23, 2007. FINDINGS: Gallbladder: Status post cholecystectomy. Common bile duct: Diameter: 3 mm which is within normal limits. Liver: No focal lesion identified. Increased echogenicity of hepatic parenchyma is noted suggesting hepatic steatosis. Portal vein is patent on color Doppler imaging with normal direction of blood flow towards the liver. IVC: No abnormality visualized. Pancreas: Visualized portion unremarkable. Spleen: Size and appearance within normal limits. Right Kidney: Length: 10.2 cm. 4.7 cm exophytic cyst is seen arising from upper pole of right kidney. Echogenicity within normal limits. No mass or hydronephrosis visualized. Left Kidney: Length: 9.7 cm. 6 cm simple cyst is seen arising from upper pole. 3.6 cm septated cyst is seen in midpole. Echogenicity within normal limits. No mass or hydronephrosis visualized. Abdominal aorta: No aneurysm visualized. Other findings: None. IMPRESSION: Status post cholecystectomy. Increased echogenicity of hepatic parenchyma is noted suggesting hepatic steatosis. 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 Follow-up ultrasound in 1 year is recommended to ensure stability. Electronically Signed   By: Lupita Raider M.D.   On: 01/11/2020 15:26   DG Chest Port 1 View  Result Date: 01/10/2020 CLINICAL DATA:  Weakness and tachycardia. EXAM: PORTABLE CHEST 1 VIEW COMPARISON:  Chest radiograph April 29, 2011 FINDINGS: The heart size and mediastinal contours are within normal limits. Chronic parenchymal lung changes  without focal consolidation. No visible pneumothorax. No pleural effusion. Thoracic spondylosis. IMPRESSION: No acute cardiopulmonary process. Electronically Signed   By: Maudry Mayhew MD   On: 01/10/2020 09:31     Subjective: Pt reports that she feels well.  She would like to go home today.  She has been tolerating diet with no problems.    Discharge Exam: Vitals:   01/12/20 0924 01/12/20 1237  BP: 99/79 133/72  Pulse: 89 78  Resp: 16 16  Temp: 98.6 F (37 C) 98.7 F (37.1 C)  SpO2: 96% 94%   Vitals:   01/12/20 0602 01/12/20 0907 01/12/20 0924 01/12/20 1237  BP: 106/61 123/68 99/79 133/72  Pulse: 74 83 89 78  Resp:  16 16 16   Temp:  98.5 F (36.9 C) 98.6 F (37 C) 98.7 F (37.1 C)  TempSrc:  Oral  Oral  SpO2:  98% 96% 94%  Weight:      Height:       General: Pt is alert, awake, not in acute distress Cardiovascular: RRR, S1/S2 +, no rubs, no gallops Respiratory: CTA bilaterally, no wheezing, no rhonchi Abdominal: Soft, NT, ND, bowel sounds + Extremities: no edema, no cyanosis   The results of significant diagnostics from this hospitalization (including imaging, microbiology, ancillary and laboratory) are listed below for reference.    Microbiology: Recent Results (from the past 240 hour(s))  Resp Panel by RT-PCR (Flu A&B, Covid) Nasopharyngeal Swab     Status: None   Collection Time: 01/11/20  8:08 AM   Specimen: Nasopharyngeal Swab; Nasopharyngeal(NP) swabs in vial transport medium  Result Value Ref Range Status   SARS Coronavirus 2 by RT PCR NEGATIVE NEGATIVE Final    Comment: (NOTE) SARS-CoV-2 target nucleic acids are NOT DETECTED.  The SARS-CoV-2 RNA is generally detectable in upper respiratory specimens during the acute phase of infection. The lowest concentration of SARS-CoV-2 viral copies this assay can detect is 138 copies/mL. A negative result does not preclude SARS-Cov-2 infection and should not be used as the sole basis for treatment  or other patient  management decisions. A negative result may occur with  improper specimen collection/handling, submission of specimen other than nasopharyngeal swab, presence of viral mutation(s) within the areas targeted by this assay, and inadequate number of viral copies(<138 copies/mL). A negative result must be combined with clinical observations, patient history, and epidemiological information. The expected result is Negative.  Fact Sheet for Patients:  BloggerCourse.com  Fact Sheet for Healthcare Providers:  SeriousBroker.it  This test is no t yet approved or cleared by the [place] FDA and  has been authorized for detection and/or diagnosis of SARS-CoV-2 by FDA under an Emergency Use Authorization (EUA). This EUA will remain  in effect (meaning this test can be used) for the duration of the COVID-19 declaration under Section 564(b)(1) of the Act, 21 U.S.C.section 360bbb-3(b)(1), unless the authorization is terminated  or revoked sooner.       Influenza A by PCR NEGATIVE NEGATIVE Final   Influenza B by PCR NEGATIVE NEGATIVE Final    Comment: (NOTE) The Xpert Xpress SARS-CoV-2/FLU/RSV plus assay is intended as an aid in the diagnosis of influenza from Nasopharyngeal swab specimens and should not be used as a sole basis for treatment. Nasal washings and aspirates are unacceptable for Xpert Xpress SARS-CoV-2/FLU/RSV testing.  Fact Sheet for Patients: BloggerCourse.com  Fact Sheet for Healthcare Providers: SeriousBroker.it  This test is not yet approved or cleared by the [place] FDA and has been authorized for detection and/or diagnosis of SARS-CoV-2 by FDA under an Emergency Use Authorization (EUA). This EUA will remain in effect (meaning this test can be used) for the duration of the COVID-19 declaration under Section 564(b)(1) of the Act, 21 U.S.C. section 360bbb-3(b)(1),  unless the authorization is terminated or revoked.  Performed at [facility], [address], [place], [place] [address]      Labs: BNP (last 3 results) No results for input(s): BNP in the last 8760 hours. Basic Metabolic Panel: Recent Labs  Lab 01/10/20 1013 01/11/20 0521 01/12/20 0436  NA 137 137 139  K 4.3 3.8 3.4*  CL 109 109 109  CO2 23 23 23   GLUCOSE 121* 83 86  BUN 18 14 9   CREATININE 0.67 0.69 0.67  CALCIUM 7.7* 8.1* 8.0*  MG  --  1.9 1.9  PHOS  --  3.3 3.5   Liver Function Tests: Recent Labs  Lab 01/10/20 1013 01/11/20 0521 01/12/20 0436  AST 59* 39 30  ALT 45* 36 32  ALKPHOS 188* 173* 171*  BILITOT 0.2* 1.0 0.9  PROT 4.6* 4.8* 4.8*  ALBUMIN 2.1* 2.1* 2.1*   No results for input(s): LIPASE, AMYLASE in the last 168 hours. No results for input(s): AMMONIA in the last 168 hours. CBC: Recent Labs  Lab 01/10/20 1013 01/11/20 0521 01/12/20 0436  WBC 4.3 4.7 3.3*  NEUTROABS 3.4  --   --   HGB 6.0* 8.2* 7.5*  HCT 21.9* 27.1* 25.2*  MCV 79.1* 76.3* 77.3*  PLT 237 229 211   Cardiac Enzymes: No results for input(s): CKTOTAL, CKMB, CKMBINDEX, TROPONINI in the last 168 hours. BNP: Invalid input(s): POCBNP CBG: No results for input(s): GLUCAP in the last 168 hours. D-Dimer No results for input(s): DDIMER in the last 72 hours. Hgb A1c Recent Labs    01/10/20 1013  HGBA1C 4.9   Lipid Profile No results for input(s): CHOL, HDL, LDLCALC, TRIG, CHOLHDL, LDLDIRECT in the last 72 hours. Thyroid function studies Recent Labs    01/10/20 1013  TSH 7.309*   Anemia  work up Recent Labs    01/10/20 1013 01/10/20 1222  VITAMINB12  --  135*  FOLATE 17.4  --   FERRITIN  --  4*  TIBC  --  317  IRON  --  11*  RETICCTPCT 1.6  --    Urinalysis    Component Value Date/Time   COLORURINE YELLOW 08/11/2011 0855   APPEARANCEUR CLEAR 08/11/2011 0855   LABSPEC 1.010 08/11/2011 0855   PHURINE 5.5 08/11/2011 0855   GLUCOSEU NEGATIVE 08/11/2011 0855    HGBUR TRACE (A) 08/11/2011 0855   BILIRUBINUR NEGATIVE 08/11/2011 0855   KETONESUR NEGATIVE 08/11/2011 0855   PROTEINUR NEGATIVE 08/11/2011 0855   UROBILINOGEN 0.2 08/11/2011 0855   NITRITE NEGATIVE 08/11/2011 0855   LEUKOCYTESUR MODERATE (A) 08/11/2011 0855   Sepsis Labs Invalid input(s): PROCALCITONIN,  WBC,  LACTICIDVEN Microbiology Recent Results (from the past 240 hour(s))  Resp Panel by RT-PCR (Flu A&B, Covid) Nasopharyngeal Swab     Status: None   Collection Time: 01/11/20  8:08 AM   Specimen: Nasopharyngeal Swab; Nasopharyngeal(NP) swabs in vial transport medium  Result Value Ref Range Status   SARS Coronavirus 2 by RT PCR NEGATIVE NEGATIVE Final    Comment: (NOTE) SARS-CoV-2 target nucleic acids are NOT DETECTED.  The SARS-CoV-2 RNA is generally detectable in upper respiratory specimens during the acute phase of infection. The lowest concentration of SARS-CoV-2 viral copies this assay can detect is 138 copies/mL. A negative result does not preclude SARS-Cov-2 infection and should not be used as the sole basis for treatment or other patient management decisions. A negative result may occur with  improper specimen collection/handling, submission of specimen other than nasopharyngeal swab, presence of viral mutation(s) within the areas targeted by this assay, and inadequate number of viral copies(<138 copies/mL). A negative result must be combined with clinical observations, patient history, and epidemiological information. The expected result is Negative.  Fact Sheet for Patients:  BloggerCourse.com  Fact Sheet for Healthcare Providers:  SeriousBroker.it  This test is no t yet approved or cleared by the [place] FDA and  has been authorized for detection and/or diagnosis of SARS-CoV-2 by FDA under an Emergency Use Authorization (EUA). This EUA will remain  in effect (meaning this test can be used) for the duration  of the COVID-19 declaration under Section 564(b)(1) of the Act, 21 U.S.C.section 360bbb-3(b)(1), unless the authorization is terminated  or revoked sooner.       Influenza A by PCR NEGATIVE NEGATIVE Final   Influenza B by PCR NEGATIVE NEGATIVE Final    Comment: (NOTE) The Xpert Xpress SARS-CoV-2/FLU/RSV plus assay is intended as an aid in the diagnosis of influenza from Nasopharyngeal swab specimens and should not be used as a sole basis for treatment. Nasal washings and aspirates are unacceptable for Xpert Xpress SARS-CoV-2/FLU/RSV testing.  Fact Sheet for Patients: BloggerCourse.com  Fact Sheet for Healthcare Providers: SeriousBroker.it  This test is not yet approved or cleared by the [place] FDA and has been authorized for detection and/or diagnosis of SARS-CoV-2 by FDA under an Emergency Use Authorization (EUA). This EUA will remain in effect (meaning this test can be used) for the duration of the COVID-19 declaration under Section 564(b)(1) of the Act, 21 U.S.C. section 360bbb-3(b)(1), unless the authorization is terminated or revoked.  Performed at [facility], [address], [place], [place] [address]    Time coordinating discharge: 36 minutes   SIGNED:  Standley Dakins, MD  [facility] 01/12/2020, 1:28 PM How to contact the [facility] Attending  or Consulting provider 7A - 7P or covering provider during after hours 7P -7A, for this patient?  1. Check the care team in [facility] and look for a) attending/consulting [facility] provider listed and b) the [facility] team listed 2. Log into [email or web address] and use [facility]'s universal password to access. If you do not have the password, please contact the hospital operator. 3. Locate the [facility] provider you are looking for under [facility] and page to a number that you can be directly reached. 4. If you still have difficulty reaching the provider, please page the [facility] (Director on  Call) for the Hospitalists listed on amion for assistance.

## 2020-01-12 NOTE — Telephone Encounter (Signed)
Patient being d/c home today. Please schedule hospital follow-up/repeat EGD in 4-6 weeks.

## 2020-01-13 LAB — TYPE AND SCREEN
ABO/RH(D): A POS
Antibody Screen: NEGATIVE
Unit division: 0
Unit division: 0
Unit division: 0

## 2020-01-13 LAB — BPAM RBC
Blood Product Expiration Date: 202112202359
Blood Product Expiration Date: 202112202359
Blood Product Expiration Date: 202112292359
ISSUE DATE / TIME: 202112011855
ISSUE DATE / TIME: 202112012225
ISSUE DATE / TIME: 202112030901
Unit Type and Rh: 6200
Unit Type and Rh: 6200
Unit Type and Rh: 6200

## 2020-01-15 ENCOUNTER — Encounter: Payer: Self-pay | Admitting: Internal Medicine

## 2020-01-15 ENCOUNTER — Other Ambulatory Visit: Payer: Self-pay

## 2020-01-15 LAB — SURGICAL PATHOLOGY

## 2020-01-15 NOTE — Telephone Encounter (Signed)
OV made and letter mailed °

## 2020-01-16 ENCOUNTER — Telehealth: Payer: Self-pay | Admitting: Internal Medicine

## 2020-01-16 ENCOUNTER — Encounter: Payer: Self-pay | Admitting: Internal Medicine

## 2020-01-16 NOTE — Telephone Encounter (Signed)
Does patient need to follow up with [place] or with Dr Patty Sermons office? Please advise. I have her scheduled an OV for 1/27 at 9am with [place], but Dr Luvenia Starch result letter says to follow up with Dr Patty Sermons office.

## 2020-01-16 NOTE — Telephone Encounter (Signed)
Per Dr.Rourks letter and per Dr.Rehmans consult note, Dr Karilyn Cota is primary gastroenterologist.

## 2020-01-16 NOTE — Telephone Encounter (Signed)
I cancelled OV with [place] and sent staff note to Dr Patty Sermons office that patient is to follow up with them. Pt is aware

## 2020-01-17 ENCOUNTER — Telehealth (INDEPENDENT_AMBULATORY_CARE_PROVIDER_SITE_OTHER): Payer: Self-pay | Admitting: Internal Medicine

## 2020-01-17 NOTE — Telephone Encounter (Signed)
Note placed on Dr. Patty Sermons desk, asking if patient could be seen by him next Thursday.

## 2020-01-17 NOTE — Telephone Encounter (Signed)
Dr Jena Gauss wants this patient to follow up with you - can you see her on a Thursday?  Please advise - thank you

## 2020-01-25 ENCOUNTER — Ambulatory Visit (INDEPENDENT_AMBULATORY_CARE_PROVIDER_SITE_OTHER): Admitting: Internal Medicine

## 2020-01-25 ENCOUNTER — Encounter (INDEPENDENT_AMBULATORY_CARE_PROVIDER_SITE_OTHER): Payer: Self-pay | Admitting: Internal Medicine

## 2020-01-25 ENCOUNTER — Other Ambulatory Visit: Payer: Self-pay

## 2020-01-25 VITALS — BP 123/82 | HR 91 | Temp 98.7°F | Ht 70.0 in | Wt 159.0 lb

## 2020-01-25 DIAGNOSIS — K9189 Other postprocedural complications and disorders of digestive system: Secondary | ICD-10-CM | POA: Diagnosis not present

## 2020-01-25 DIAGNOSIS — K279 Peptic ulcer, site unspecified, unspecified as acute or chronic, without hemorrhage or perforation: Secondary | ICD-10-CM | POA: Diagnosis not present

## 2020-01-25 DIAGNOSIS — D500 Iron deficiency anemia secondary to blood loss (chronic): Secondary | ICD-10-CM

## 2020-01-25 DIAGNOSIS — K289 Gastrojejunal ulcer, unspecified as acute or chronic, without hemorrhage or perforation: Secondary | ICD-10-CM | POA: Insufficient documentation

## 2020-01-25 MED ORDER — FLINTSTONES COMPLETE 18 MG PO CHEW: 1.0000 | CHEWABLE_TABLET | Freq: Two times a day (BID) | ORAL | 3 refills | Status: DC

## 2020-01-25 NOTE — Patient Instructions (Addendum)
Can use Reglan/metoclopramide on as-needed basis for nausea. Notify if you have rectal bleeding or tarry stool. now that your hemoglobin is improving begin physical activity such as brisk walking as we discussed. Repeat esophagogastroduodenoscopy date to be determined after your next visit in 3 months

## 2020-01-25 NOTE — Progress Notes (Signed)
Presenting complaint;  Follow-up to recent hospitalization for iron deficiency anemia and GI bleed.  Database and subjective:  Patient is 59 year old Caucasian female with complicated GI history whom I saw initially on 01/10/2020 in emergency room where she presented with hemoglobin of 6 g and stool was guaiac positive.  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  She did not have any follow-up visit. She underwent EGD by Dr. Gala Romney on 01/11/2020 which revealed very large anastomotic ulcer with clean base.  Jejunal mucosa was normal.  This was felt to be source of patient's GI bleed and given capsule study therefore was not performed. Patient was discharged on double dose PPI and advised not to take NSAIDs.  She was also begun on ferrous sulfate.  She states she has lost that bottle.  She would like to try different iron preparation. She had blood work at [facility].  Dr. Delanna Ahmadi office called while I was seeing the patient that her hemoglobin was 10.1.  Her hemoglobin was 8.9 on 01/12/2020. During recent hospitalization she was noted to have mildly elevated alkaline phosphatase and transaminases.  Abdominal ultrasound was obtained and revealed fatty liver.  Hepatitis B surface antigen, hepatitis C virus antibody and ANA were negative.  Patient says she does not have an appetite.  She is not having nausea and vomiting.  She is not using metoclopramide on regular basis.  She has been using Phenergan 3-4 times in a month.  She denies heartburn dysphagia or rectal bleeding.  She says is her stools have been dark since she has been on iron.  She has not lost any weight since she was discharged from the hospital. She is not taking OTC  NSAIDs.  Current Medications: Outpatient Encounter Medications as of 01/25/2020  Medication Sig  . ALPRAZolam (XANAX) 0.5 MG tablet Take 0.5 mg by mouth at bedtime. *May take up to 4 times daily as needed For anxiety and sleep  . CALCIUM PO Take 1 tablet by mouth daily.  Marland Kitchen CRANBERRY FRUIT PO Take 1 tablet by mouth daily.  Marland Kitchen dicyclomine (BENTYL) 20 MG tablet Take 1 tablet (20 mg total) by mouth 3 (three) times daily as needed. For diarrhea and cramping  . diphenhydrAMINE (BENADRYL) 25 mg capsule Take 25 mg by mouth 3 (three) times daily as needed for itching or allergies.  Marland Kitchen levothyroxine (SYNTHROID, LEVOTHROID) 125 MCG tablet Take 125 mcg by mouth daily before breakfast.   . Multiple Vitamin (MULTIVITAMIN WITH MINERALS) TABS tablet Take 2 tablets by mouth daily.  . pantoprazole (PROTONIX) 40 MG tablet Take 1 tablet (40 mg total) by mouth 2 (two) times daily before a meal.  . promethazine (PHENERGAN) 25 MG tablet Take 25 mg by mouth every 6 (six) hours as needed. For nausea  . traMADol (ULTRAM) 50 MG tablet Take 50 mg by mouth every 4 (four) hours as needed for pain.  . vitamin B-12 1000 MCG tablet Take 1 tablet (1,000 mcg total) by mouth daily.  . Vitamin D, Ergocalciferol, (DRISDOL) 1.25 MG (50000 UNIT) CAPS capsule Take 50,000 Units by mouth once a week.  . [DISCONTINUED] misoprostol (CYTOTEC) 100 MCG tablet Take 100 mcg by mouth 4 (four) times daily.   Marland Kitchen BIOTIN PO Take 1 tablet by mouth daily. (Patient  not taking: Reported on 01/25/2020)  . metoCLOPramide (REGLAN) 5 MG/5ML solution Take 10 mLs (10 mg total) by mouth 4 (four) times daily -  before meals and at bedtime. (Patient not taking: Reported on 01/25/2020)  . [DISCONTINUED] ferrous sulfate 325 (65 FE) MG tablet Take 325 mg by mouth daily. (Patient not taking: Reported on 01/25/2020)  . [DISCONTINUED] thiamine (VITAMIN B-1) 100 MG tablet Take 100 mg by mouth daily. (Patient not taking: Reported on 01/25/2020)   No facility-administered  encounter medications on file as of 01/25/2020.     Objective: Blood pressure 123/82, pulse 91, temperature 98.7 F (37.1 C), temperature source Oral, height 5' 10"  (1.778 m), weight 159 lb (72.1 kg), last menstrual period 08/14/2011. Patient is alert and in no acute distress. She is wearing a mask. She does not appear to be pale as she did 2 weeks ago. Conjunctiva is pink. Sclera is nonicteric Oropharyngeal mucosa is normal. No neck masses or thyromegaly noted. Cardiac exam with regular rhythm normal S1 and S2. No murmur or gallop noted. Lungs are clear to auscultation. Abdomen is symmetrical soft and nontender with organomegaly or masses. No LE edema or clubbing noted.  Labs/studies Results:   CBC Latest Ref Rng & Units 01/12/2020 01/12/2020 01/11/2020  WBC 4.0 - 10.5 K/uL - 3.3(L) 4.7  Hemoglobin 12.0 - 15.0 g/dL 8.9(L) 7.5(L) 8.2(L)  Hematocrit 36.0 - 46.0 % 29.7(L) 25.2(L) 27.1(L)  Platelets 150 - 400 K/uL - 211 229    CMP Latest Ref Rng & Units 01/12/2020 01/11/2020 01/10/2020  Glucose 70 - 99 mg/dL 86 83 121(H)  BUN 6 - 20 mg/dL 9 14 18   Creatinine 0.44 - 1.00 mg/dL 0.67 0.69 0.67  Sodium 135 - 145 mmol/L 139 137 137  Potassium 3.5 - 5.1 mmol/L 3.4(L) 3.8 4.3  Chloride 98 - 111 mmol/L 109 109 109  CO2 22 - 32 mmol/L 23 23 23   Calcium 8.9 - 10.3 mg/dL 8.0(L) 8.1(L) 7.7(L)  Total Protein 6.5 - 8.1 g/dL 4.8(L) 4.8(L) 4.6(L)  Total Bilirubin 0.3 - 1.2 mg/dL 0.9 1.0 0.2(L)  Alkaline Phos 38 - 126 U/L 171(H) 173(H) 188(H)  AST 15 - 41 U/L 30 39 59(H)  ALT 0 - 44 U/L 32 36 45(H)    Hepatic Function Latest Ref Rng & Units 01/12/2020 01/11/2020 01/10/2020  Total Protein 6.5 - 8.1 g/dL 4.8(L) 4.8(L) 4.6(L)  Albumin 3.5 - 5.0 g/dL 2.1(L) 2.1(L) 2.1(L)  AST 15 - 41 U/L 30 39 59(H)  ALT 0 - 44 U/L 32 36 45(H)  Alk Phosphatase 38 - 126 U/L 171(H) 173(H) 188(H)  Total Bilirubin 0.3 - 1.2 mg/dL 0.9 1.0 0.2(L)    Lab data from Dr. Delanna Ahmadi office  CBC done earlier today. WBC  5.3 H&H 10.1 and 32.8 and platelet 455K MCV 75.  Assessment:  #1.  Iron deficiency anemia.  I suspect she is been losing blood chronically from gastrojejunal anastomotic ulcer although she may have had a significant bleed prior to hospitalization.  Given history of bariatric surgery she needs to be on p.o. iron chronically.  She had colonoscopy at [facility] last year.  Therefore no indication for further evaluation.  #2.  Gastrojejunal anastomotic ulcer.  It remains to be seen if this ulcer would heal with medical therapy.  I wonder if she has had an ulcer since she had axios stent placement and subsequent removal for gastro jejunal anastomotic stricture.  Other etiology would be NSAID use but she is not taking NSAIDs anymore.  #3.  Abnormal LFTs noted during recent hospitalization.  Transaminases were mildly elevated to begin and and normalized when she was discharged.  Alkaline phosphatase remains mildly elevated.  It will need to be followed up.  Her serum albumin was very low and would appear to be due to malnutrition.  Plan:  Patient advised to eat 5 or 6 small meals daily. She can use metoclopramide on as-needed basis for nausea. Flintstone chewable with iron 1 tablet twice daily. Patient advised to consider physical activity or regular walking in order to prevent muscle wasting. We recommend she also have LFTs when she has CBC at Dr. Delanna Ahmadi office in January 2022. Office visit in 3 months. Timing of EGD to be determined at the time of next visit. Patient advised to call office should she experience rectal bleeding or melena.

## 2020-03-07 ENCOUNTER — Ambulatory Visit: Admitting: Nurse Practitioner

## 2020-03-18 ENCOUNTER — Encounter (INDEPENDENT_AMBULATORY_CARE_PROVIDER_SITE_OTHER): Payer: Self-pay

## 2020-05-21 ENCOUNTER — Other Ambulatory Visit (INDEPENDENT_AMBULATORY_CARE_PROVIDER_SITE_OTHER): Payer: Self-pay | Admitting: *Deleted

## 2020-05-21 ENCOUNTER — Encounter (INDEPENDENT_AMBULATORY_CARE_PROVIDER_SITE_OTHER): Payer: Self-pay | Admitting: Internal Medicine

## 2020-05-21 ENCOUNTER — Other Ambulatory Visit: Payer: Self-pay

## 2020-05-21 ENCOUNTER — Ambulatory Visit (INDEPENDENT_AMBULATORY_CARE_PROVIDER_SITE_OTHER): Admitting: Internal Medicine

## 2020-05-21 ENCOUNTER — Encounter (INDEPENDENT_AMBULATORY_CARE_PROVIDER_SITE_OTHER): Payer: Self-pay | Admitting: *Deleted

## 2020-05-21 VITALS — BP 133/85 | HR 92 | Temp 99.0°F | Ht 70.0 in | Wt 162.1 lb

## 2020-05-21 DIAGNOSIS — D509 Iron deficiency anemia, unspecified: Secondary | ICD-10-CM | POA: Diagnosis not present

## 2020-05-21 DIAGNOSIS — K279 Peptic ulcer, site unspecified, unspecified as acute or chronic, without hemorrhage or perforation: Secondary | ICD-10-CM | POA: Diagnosis not present

## 2020-05-21 DIAGNOSIS — E538 Deficiency of other specified B group vitamins: Secondary | ICD-10-CM

## 2020-05-21 DIAGNOSIS — K9189 Other postprocedural complications and disorders of digestive system: Secondary | ICD-10-CM

## 2020-05-21 MED ORDER — PANTOPRAZOLE SODIUM 40 MG PO TBEC
40.0000 mg | DELAYED_RELEASE_TABLET | Freq: Two times a day (BID) | ORAL | 3 refills | Status: DC
Start: 2020-05-21 — End: 2020-06-05

## 2020-05-21 NOTE — Patient Instructions (Signed)
Physician will call with results of blood test. Esophagogastroduodenoscopy to be scheduled.

## 2020-05-21 NOTE — Progress Notes (Signed)
Presenting complaint;  Follow-up for peptic ulcer disease/anastomotic ulcer and anemia.  Database and subjective:  Patient is 60 year old Caucasian female who was diagnosed with anastomotic ulcer when she presented with profound anemia in December 2021 requiring transfusion.  She had laparoscopic gastric bypass in December 2013.  Last year she developed anastomotic stricture which did not respond to balloon dilation.  She had axial stent placed for a few months. Since her EGD of December 2021 she has been maintained on double dose PPI. She says she feels much better.  She says busy.  She does housework on her own.  Previously she had to stop after a few minutes but not anymore.  Her appetite is good.  She has gained 3 pounds.  She denies nausea vomiting heartburn dysphagia or abdominal pain.  She generally eats 2 meals and maybe one snack a day.  She does not do any regular scheduled physical activity. She has chronic back pain for which uses Ultram usually no more than once a day.  She does not take OTC NSAIDs.  She denies melena or rectal bleeding.  Current Medications: Outpatient Encounter Medications as of 05/21/2020  Medication Sig  . ALPRAZolam (XANAX) 0.5 MG tablet Take 0.5 mg by mouth at bedtime. *May take up to 4 times daily as needed For anxiety and sleep  . BIOTIN PO Take 1 tablet by mouth daily.  . diphenhydrAMINE (BENADRYL) 25 mg capsule Take 25 mg by mouth 3 (three) times daily as needed for itching or allergies.  Marland Kitchen levothyroxine (SYNTHROID, LEVOTHROID) 125 MCG tablet Take 125 mcg by mouth daily before breakfast.   . metoCLOPramide (REGLAN) 5 MG/5ML solution Take 10 mLs (10 mg total) by mouth 4 (four) times daily -  before meals and at bedtime. (Patient taking differently: Take 10 mg by mouth every 6 (six) hours as needed.)  . Multiple Vitamin (MULTIVITAMIN WITH MINERALS) TABS tablet Take 2 tablets by mouth daily.  . pantoprazole (PROTONIX) 40 MG tablet Take 1 tablet (40 mg total) by  mouth 2 (two) times daily before a meal.  . Pediatric Multivitamins-Iron (FLINTSTONES COMPLETE) 18 MG CHEW Chew 1 tablet by mouth in the morning and at bedtime.  . promethazine (PHENERGAN) 25 MG tablet Take 25 mg by mouth every 6 (six) hours as needed. For nausea  . traMADol (ULTRAM) 50 MG tablet Take 50 mg by mouth every 4 (four) hours as needed for pain.  . vitamin B-12 1000 MCG tablet Take 1 tablet (1,000 mcg total) by mouth daily.  . Vitamin D, Ergocalciferol, (DRISDOL) 1.25 MG (50000 UNIT) CAPS capsule Take 50,000 Units by mouth once a week.  Marland Kitchen CALCIUM PO Take 1 tablet by mouth daily. (Patient not taking: Reported on 05/21/2020)  . CRANBERRY FRUIT PO Take 1 tablet by mouth daily. (Patient not taking: Reported on 05/21/2020)  . dicyclomine (BENTYL) 20 MG tablet Take 1 tablet (20 mg total) by mouth 3 (three) times daily as needed. For diarrhea and cramping   No facility-administered encounter medications on file as of 05/21/2020.     Objective: Blood pressure 133/85, pulse 92, temperature 99 F (37.2 C), temperature source Oral, height 5' 10"  (1.778 m), weight 162 lb 1.6 oz (73.5 kg), last menstrual period 08/14/2011. Patient is alert and in no acute distress. Conjunctiva is pink. Sclera is nonicteric Oropharyngeal mucosa is normal. No neck masses or thyromegaly noted. Cardiac exam with regular rhythm normal S1 and S2. No murmur or gallop noted. Lungs are clear to auscultation. Abdomen is symmetrical with  laparoscopy scars across upper abdomen.  On palpation abdomen is soft and nontender with organomegaly or masses. No LE edema or clubbing noted.  Labs/studies Results:  CBC Latest Ref Rng & Units 01/12/2020 01/12/2020 01/11/2020  WBC 4.0 - 10.5 K/uL - 3.3(L) 4.7  Hemoglobin 12.0 - 15.0 g/dL 8.9(L) 7.5(L) 8.2(L)  Hematocrit 36.0 - 46.0 % 29.7(L) 25.2(L) 27.1(L)  Platelets 150 - 400 K/uL - 211 229    CMP Latest Ref Rng & Units 01/12/2020 01/11/2020 01/10/2020  Glucose 70 - 99 mg/dL 86 83  121(H)  BUN 6 - 20 mg/dL 9 14 18   Creatinine 0.44 - 1.00 mg/dL 0.67 0.69 0.67  Sodium 135 - 145 mmol/L 139 137 137  Potassium 3.5 - 5.1 mmol/L 3.4(L) 3.8 4.3  Chloride 98 - 111 mmol/L 109 109 109  CO2 22 - 32 mmol/L 23 23 23   Calcium 8.9 - 10.3 mg/dL 8.0(L) 8.1(L) 7.7(L)  Total Protein 6.5 - 8.1 g/dL 4.8(L) 4.8(L) 4.6(L)  Total Bilirubin 0.3 - 1.2 mg/dL 0.9 1.0 0.2(L)  Alkaline Phos 38 - 126 U/L 171(H) 173(H) 188(H)  AST 15 - 41 U/L 30 39 59(H)  ALT 0 - 44 U/L 32 36 45(H)    Hepatic Function Latest Ref Rng & Units 01/12/2020 01/11/2020 01/10/2020  Total Protein 6.5 - 8.1 g/dL 4.8(L) 4.8(L) 4.6(L)  Albumin 3.5 - 5.0 g/dL 2.1(L) 2.1(L) 2.1(L)  AST 15 - 41 U/L 30 39 59(H)  ALT 0 - 44 U/L 32 36 45(H)  Alk Phosphatase 38 - 126 U/L 171(H) 173(H) 188(H)  Total Bilirubin 0.3 - 1.2 mg/dL 0.9 1.0 0.2(L)    Additional lab data from Dr. Delanna Ahmadi office  H&H 10.1 and 32.8 on 01/25/2020  Assessment:  #1.  History of gastrojejunal anastomotic ulcer(December 2021) patient is now asymptomatic.  Follow-up EGD to document complete healing this ulcer is recommended before PPI therapy change.  #2.  Iron deficiency anemia.  Patient is on low-dose p.o. iron.  #3.  B12 deficiency.  Plan:  Medication list updated.  Expired medications deleted. New prescription given for pantoprazole 40 mg p.o. twice daily with 3 refills. Patient will go to the lab for CBC, serum iron TIBC ferritin and B12 levels. Esophagogastroduodenoscopy under monitored anesthesia care to document complete healing of anastomotic ulcer. Office visit in 1 year.

## 2020-05-22 ENCOUNTER — Other Ambulatory Visit (INDEPENDENT_AMBULATORY_CARE_PROVIDER_SITE_OTHER): Payer: Self-pay

## 2020-05-22 DIAGNOSIS — K279 Peptic ulcer, site unspecified, unspecified as acute or chronic, without hemorrhage or perforation: Secondary | ICD-10-CM

## 2020-05-22 DIAGNOSIS — D509 Iron deficiency anemia, unspecified: Secondary | ICD-10-CM

## 2020-05-22 LAB — CBC
HCT: 40.2 % (ref 35.0–45.0)
Hemoglobin: 12 g/dL (ref 11.7–15.5)
MCH: 24.7 pg — ABNORMAL LOW (ref 27.0–33.0)
MCHC: 29.9 g/dL — ABNORMAL LOW (ref 32.0–36.0)
MCV: 82.9 fL (ref 80.0–100.0)
MPV: 10.5 fL (ref 7.5–12.5)
Platelets: 260 10*3/uL (ref 140–400)
RBC: 4.85 10*6/uL (ref 3.80–5.10)
RDW: 15.7 % — ABNORMAL HIGH (ref 11.0–15.0)
WBC: 4.5 10*3/uL (ref 3.8–10.8)

## 2020-05-22 LAB — IRON,TIBC AND FERRITIN PANEL
%SAT: 9 % (calc) — ABNORMAL LOW (ref 16–45)
Ferritin: 10 ng/mL — ABNORMAL LOW (ref 16–288)
Iron: 33 ug/dL — ABNORMAL LOW (ref 45–160)
TIBC: 351 mcg/dL (calc) (ref 250–450)

## 2020-05-22 LAB — VITAMIN B12: Vitamin B-12: >2000 pg/mL — ABNORMAL HIGH (ref 200–1100)

## 2020-05-30 NOTE — Patient Instructions (Signed)
GWYNN CHALKER  05/30/2020     @PREFPERIOPPHARMACY @   Your procedure is scheduled on  06/05/2020   Report to [facility] at  1130  A.M.   Call this number if you have problems the morning of surgery:  [phone]   Remember:  Follow the diet instructions given to you by the office.                      Take these medicines the morning of surgery with A SIP OF WATER  Benadryl, levpthyroxine, protonix, phenergan (if needed), tramadol(if needed).     Please brush your teeth.  Do not wear jewelry, make-up or nail polish.  Do not wear lotions, powders, or perfumes, or deodorant.  Do not shave 48 hours prior to surgery.  Men may shave face and neck.  Do not bring valuables to the hospital.  [facility] is not responsible for any belongings or valuables.  Contacts, dentures or bridgework may not be worn into surgery.  Leave your suitcase in the car.  After surgery it may be brought to your room.  For patients admitted to the hospital, discharge time will be determined by your treatment team.  Patients discharged the day of surgery will not be allowed to drive home and must have someone with them for 24 hours.      Special instructions:  DO NOT smoke tobacco or vape for 24 hours before your procedure.   Please read over the following fact sheets that you were given. Anesthesia Post-op Instructions and Care and Recovery After Surgery       Upper Endoscopy, Adult, Care After This sheet gives you information about how to care for yourself after your procedure. Your health care provider may also give you more specific instructions. If you have problems or questions, contact your health care provider. What can I expect after the procedure? After the procedure, it is common to have:  A sore throat.  Mild stomach pain or discomfort.  Bloating.  Nausea. Follow these instructions at home:  Follow instructions from your health care provider about what to eat or drink  after your procedure.  Return to your normal activities as told by your health care provider. Ask your health care provider what activities are safe for you.  Take over-the-counter and prescription medicines only as told by your health care provider.  If you were given a sedative during the procedure, it can affect you for several hours. Do not drive or operate machinery until your health care provider says that it is safe.  Keep all follow-up visits as told by your health care provider. This is important.   Contact a health care provider if you have:  A sore throat that lasts longer than one day.  Trouble swallowing. Get help right away if:  You vomit blood or your vomit looks like coffee grounds.  You have: ? A fever. ? Bloody, black, or tarry stools. ? A severe sore throat or you cannot swallow. ? Difficulty breathing. ? Severe pain in your chest or abdomen. Summary  After the procedure, it is common to have a sore throat, mild stomach discomfort, bloating, and nausea.  If you were given a sedative during the procedure, it can affect you for several hours. Do not drive or operate machinery until your health care provider says that it is safe.  Follow instructions from your health care provider about what to eat or drink  after your procedure.  Return to your normal activities as told by your health care provider. This information is not intended to replace advice given to you by your health care provider. Make sure you discuss any questions you have with your health care provider. Document Revised: 01/24/2019 Document Reviewed: 06/28/2017 Elsevier Patient Education  2021 [place] After This sheet gives you information about how to care for yourself after your procedure. Your health care provider may also give you more specific instructions. If you have problems or questions, contact your health care provider. What can I expect after the  procedure? After the procedure, it is common to have:  Tiredness.  Forgetfulness about what happened after the procedure.  Impaired judgment for important decisions.  Nausea or vomiting.  Some difficulty with balance. Follow these instructions at home: For the time period you were told by your health care provider:  Rest as needed.  Do not participate in activities where you could fall or become injured.  Do not drive or use machinery.  Do not drink alcohol.  Do not take sleeping pills or medicines that cause drowsiness.  Do not make important decisions or sign legal documents.  Do not take care of children on your own.      Eating and drinking  Follow the diet that is recommended by your health care provider.  Drink enough fluid to keep your urine pale yellow.  If you vomit: ? Drink water, juice, or soup when you can drink without vomiting. ? Make sure you have little or no nausea before eating solid foods. General instructions  Have a responsible adult stay with you for the time you are told. It is important to have someone help care for you until you are awake and alert.  Take over-the-counter and prescription medicines only as told by your health care provider.  If you have sleep apnea, surgery and certain medicines can increase your risk for breathing problems. Follow instructions from your health care provider about wearing your sleep device: ? Anytime you are sleeping, including during daytime naps. ? While taking prescription pain medicines, sleeping medicines, or medicines that make you drowsy.  Avoid smoking.  Keep all follow-up visits as told by your health care provider. This is important. Contact a health care provider if:  You keep feeling nauseous or you keep vomiting.  You feel light-headed.  You are still sleepy or having trouble with balance after 24 hours.  You develop a rash.  You have a fever.  You have redness or swelling around  the IV site. Get help right away if:  You have trouble breathing.  You have new-onset confusion at home. Summary  For several hours after your procedure, you may feel tired. You may also be forgetful and have poor judgment.  Have a responsible adult stay with you for the time you are told. It is important to have someone help care for you until you are awake and alert.  Rest as told. Do not drive or operate machinery. Do not drink alcohol or take sleeping pills.  Get help right away if you have trouble breathing, or if you suddenly become confused. This information is not intended to replace advice given to you by your health care provider. Make sure you discuss any questions you have with your health care provider. Document Revised: 10/12/2019 Document Reviewed: 12/29/2018 Elsevier Patient Education  2021 [employer].

## 2020-06-03 ENCOUNTER — Other Ambulatory Visit: Payer: Self-pay

## 2020-06-03 ENCOUNTER — Other Ambulatory Visit (HOSPITAL_COMMUNITY)
Admission: RE | Admit: 2020-06-03 | Discharge: 2020-06-03 | Disposition: A | Discharge status: Home / Self Care | Source: Ambulatory Visit | Attending: Internal Medicine | Admitting: Internal Medicine

## 2020-06-03 ENCOUNTER — Encounter (HOSPITAL_COMMUNITY)
Admission: RE | Admit: 2020-06-03 | Discharge: 2020-06-03 | Disposition: A | Discharge status: Home / Self Care | Source: Ambulatory Visit | Attending: Internal Medicine | Admitting: Internal Medicine

## 2020-06-03 ENCOUNTER — Encounter (HOSPITAL_COMMUNITY): Payer: Self-pay

## 2020-06-03 DIAGNOSIS — Z20822 Contact with and (suspected) exposure to covid-19: Secondary | ICD-10-CM | POA: Diagnosis not present

## 2020-06-03 DIAGNOSIS — Z01812 Encounter for preprocedural laboratory examination: Secondary | ICD-10-CM | POA: Insufficient documentation

## 2020-06-03 LAB — SARS CORONAVIRUS 2 (TAT 6-24 HRS): SARS Coronavirus 2: NEGATIVE

## 2020-06-03 NOTE — Progress Notes (Signed)
Patient's Covid order wasn't released. I released the order. Patient has to have Covid test before her procedure.

## 2020-06-05 ENCOUNTER — Encounter (HOSPITAL_COMMUNITY)
Admission: RE | Disposition: A | Discharge status: Home / Self Care | Payer: Self-pay | Source: Home / Self Care | Attending: Internal Medicine

## 2020-06-05 ENCOUNTER — Ambulatory Visit (HOSPITAL_COMMUNITY)
Admission: RE | Admit: 2020-06-05 | Discharge: 2020-06-05 | Disposition: A | Discharge status: Home / Self Care | Attending: Internal Medicine | Admitting: Internal Medicine

## 2020-06-05 ENCOUNTER — Encounter (HOSPITAL_COMMUNITY): Payer: Self-pay | Admitting: Internal Medicine

## 2020-06-05 ENCOUNTER — Other Ambulatory Visit: Payer: Self-pay

## 2020-06-05 ENCOUNTER — Ambulatory Visit (HOSPITAL_COMMUNITY): Admitting: Anesthesiology

## 2020-06-05 DIAGNOSIS — K2289 Other specified disease of esophagus: Secondary | ICD-10-CM

## 2020-06-05 DIAGNOSIS — Z87892 Personal history of anaphylaxis: Secondary | ICD-10-CM | POA: Insufficient documentation

## 2020-06-05 DIAGNOSIS — Z885 Allergy status to narcotic agent status: Secondary | ICD-10-CM | POA: Insufficient documentation

## 2020-06-05 DIAGNOSIS — Z91030 Bee allergy status: Secondary | ICD-10-CM | POA: Diagnosis not present

## 2020-06-05 DIAGNOSIS — Z8711 Personal history of peptic ulcer disease: Secondary | ICD-10-CM | POA: Diagnosis present

## 2020-06-05 DIAGNOSIS — K289 Gastrojejunal ulcer, unspecified as acute or chronic, without hemorrhage or perforation: Secondary | ICD-10-CM | POA: Diagnosis not present

## 2020-06-05 DIAGNOSIS — Z91040 Latex allergy status: Secondary | ICD-10-CM | POA: Insufficient documentation

## 2020-06-05 DIAGNOSIS — Z79899 Other long term (current) drug therapy: Secondary | ICD-10-CM | POA: Insufficient documentation

## 2020-06-05 DIAGNOSIS — Z881 Allergy status to other antibiotic agents status: Secondary | ICD-10-CM | POA: Insufficient documentation

## 2020-06-05 DIAGNOSIS — Z886 Allergy status to analgesic agent status: Secondary | ICD-10-CM | POA: Diagnosis not present

## 2020-06-05 DIAGNOSIS — K9189 Other postprocedural complications and disorders of digestive system: Secondary | ICD-10-CM

## 2020-06-05 DIAGNOSIS — Z79890 Hormone replacement therapy (postmenopausal): Secondary | ICD-10-CM | POA: Insufficient documentation

## 2020-06-05 DIAGNOSIS — Z980 Intestinal bypass and anastomosis status: Secondary | ICD-10-CM | POA: Diagnosis not present

## 2020-06-05 DIAGNOSIS — Z09 Encounter for follow-up examination after completed treatment for conditions other than malignant neoplasm: Secondary | ICD-10-CM | POA: Diagnosis not present

## 2020-06-05 DIAGNOSIS — K3189 Other diseases of stomach and duodenum: Secondary | ICD-10-CM

## 2020-06-05 DIAGNOSIS — Z9884 Bariatric surgery status: Secondary | ICD-10-CM | POA: Diagnosis not present

## 2020-06-05 DIAGNOSIS — D509 Iron deficiency anemia, unspecified: Secondary | ICD-10-CM

## 2020-06-05 DIAGNOSIS — K279 Peptic ulcer, site unspecified, unspecified as acute or chronic, without hemorrhage or perforation: Secondary | ICD-10-CM

## 2020-06-05 HISTORY — PX: ESOPHAGOGASTRODUODENOSCOPY (EGD) WITH PROPOFOL: SHX5813

## 2020-06-05 SURGERY — ESOPHAGOGASTRODUODENOSCOPY (EGD) WITH PROPOFOL
Anesthesia: General

## 2020-06-05 MED ORDER — PROPOFOL 10 MG/ML IV BOLUS
INTRAVENOUS | Status: DC | PRN
  Administered 2020-06-05: 100 mg via INTRAVENOUS

## 2020-06-05 MED ORDER — LACTATED RINGERS IV SOLN: INTRAVENOUS | Status: DC

## 2020-06-05 MED ORDER — PROPOFOL 500 MG/50ML IV EMUL
INTRAVENOUS | Status: DC | PRN
  Administered 2020-06-05: 150 ug/kg/min via INTRAVENOUS

## 2020-06-05 MED ORDER — PANTOPRAZOLE SODIUM 40 MG PO TBEC: 40.0000 mg | DELAYED_RELEASE_TABLET | Freq: Every day | ORAL | 3 refills | Status: DC

## 2020-06-05 MED ORDER — LIDOCAINE HCL (CARDIAC) PF 100 MG/5ML IV SOSY
PREFILLED_SYRINGE | INTRAVENOUS | Status: DC | PRN
  Administered 2020-06-05: 50 mg via INTRAVENOUS

## 2020-06-05 NOTE — H&P (Signed)
[name] is an 60 y.o. female.   Chief Complaint: Patient is here for esophagogastroduodenoscopy. HPI: Patient is 60 year old Caucasian female who has complicated history of peptic ulcer disease.  She had bariatric surgery years ago.  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  She had axios stent placement at [facility] last year.  She presented this facility in December 2021 with melena and profound anemia requiring transfusion.  EGD revealed large anastomotic ulcer.  Patient does not take NSAIDs.  She does not smoke cigarettes.  Her anemia has corrected.  She has not had any more episodes of nausea vomiting or melena.  She did experience nausea yesterday after she ate [place] sandwich.  She did not experience vomiting.  Past Medical History:  Diagnosis Date  . Depression    started Lexapro when husband passed away  . Hypertension   . Hypothyroidism   . IBS (irritable bowel syndrome)   . PONV (postoperative nausea and vomiting)     Past Surgical History:  Procedure Laterality Date  . ABDOMINAL HYSTERECTOMY  08/11/2011   Procedure: HYSTERECTOMY ABDOMINAL;  Surgeon: [name], MD;  Location: [facility];  Service: Gynecology;  Laterality: N/A;  . BIOPSY  01/11/2020   Procedure: BIOPSY;  Surgeon: Corbin Ade, MD;  Location: [facility];  Service: Endoscopy;;  . CESAREAN SECTION     x 2  . CHOLECYSTECTOMY  2009   [facility]  . ESOPHAGOGASTRODUODENOSCOPY  Jan 2004   RMR: normal esophagus, small hiatal hernia, normal D1, D2  . ESOPHAGOGASTRODUODENOSCOPY (EGD) WITH PROPOFOL N/A 01/11/2020   Procedure: ESOPHAGOGASTRODUODENOSCOPY (EGD) WITH PROPOFOL;  Surgeon: Corbin Ade, MD;  Location: [facility];  Service: Endoscopy;  Laterality: N/A;  . FOOT SURGERY     x 5 right  . GASTRIC BYPASS    . gastric stent  2020  . HAND SURGERY     x2 right  . HEMATOMA EVACUATION  08/14/2011   Procedure: EVACUATION HEMATOMA;  Surgeon: [name], MD;  Location: [facility];   Service: Gynecology;  Laterality: N/A;  . SALPINGOOPHORECTOMY  08/11/2011   Procedure: SALPINGO OOPHERECTOMY;  Surgeon: [name], MD;  Location: [facility];  Service: Gynecology;  Laterality: Bilateral;  . SCAR REVISION  08/11/2011   Procedure: SCAR REVISION;  Surgeon: [name], MD;  Location: [facility];  Service: Gynecology;  Laterality: N/A;  Excision of Wide Cicatrix    Family History  Problem Relation Age of Onset  . Colon cancer Neg Hx    Social History:  reports that she has never smoked. She has never used smokeless tobacco. She reports that she does not drink alcohol and does not use drugs.  Allergies:  Allergies  Allergen Reactions  . Bee Pollen Anaphylaxis  . Bee Venom Anaphylaxis and Itching    Swelling and itching  . Hydrocodone-Acetaminophen Itching  . Chocolate Other (See Comments)    Reaction: causes facial blistering,inside mouth  . Nsaids Nausea And Vomiting  . Synthroid [Levothyroxine Sodium] Itching and Swelling    Patient has a reaction to brand name synthroid only  . Latex Rash  . Neosporin [Neomycin-Bacitracin Zn-Polymyx] Rash    Medications Prior to Admission  Medication Sig Dispense Refill  . ALPRAZolam (XANAX) 0.5 MG tablet Take 0.5 mg by mouth at bedtime.    . Biotin 5 MG TABS Take 5 mg by mouth daily.    Marland Kitchen bismuth subsalicylate (PEPTO BISMOL) 262 MG/15ML suspension Take 30 mLs by mouth every  6 (six) hours as needed for diarrhea or loose stools or indigestion.    . diphenhydrAMINE (BENADRYL) 25 mg capsule Take 50 mg by mouth in the morning and at bedtime.    Marland Kitchen levothyroxine (SYNTHROID, LEVOTHROID) 125 MCG tablet Take 125 mcg by mouth daily before breakfast.     . Multiple Minerals-Vitamins (CALCIUM-MAGNESIUM-ZINC-D3) TABS Take 1 tablet by mouth daily.    . pantoprazole (PROTONIX) 40 MG tablet Take 1 tablet (40 mg total) by mouth 2 (two) times daily before a meal. 60 tablet 3  . Pediatric Multivitamins-Iron (FLINTSTONES COMPLETE) 18 MG CHEW Chew 1  tablet by mouth in the morning and at bedtime. (Patient taking differently: Chew 2 tablets by mouth daily.) 180 tablet 3  . promethazine (PHENERGAN) 25 MG tablet Take 25 mg by mouth every 6 (six) hours as needed for vomiting or nausea.    . traMADol (ULTRAM) 50 MG tablet Take 50 mg by mouth every 4 (four) hours as needed for pain.    . vitamin B-12 1000 MCG tablet Take 1 tablet (1,000 mcg total) by mouth daily. (Patient taking differently: Take 3,000 mcg by mouth daily.) 30 tablet 3  . vitamin C (ASCORBIC ACID) 500 MG tablet Take 500 mg by mouth daily.    . Vitamin D, Ergocalciferol, (DRISDOL) 1.25 MG (50000 UNIT) CAPS capsule Take 50,000 Units by mouth once a week.      No results found for this or any previous visit (from the past 48 hour(s)). No results found.  Review of Systems  Blood pressure (!) 147/76, pulse 64, temperature 98.8 F (37.1 C), temperature source Oral, resp. rate 13, last menstrual period 08/14/2011, SpO2 99 %. Physical Exam HENT:     Mouth/Throat:     Mouth: Mucous membranes are moist.  Eyes:     General: No scleral icterus.    Conjunctiva/sclera: Conjunctivae normal.  Cardiovascular:     Rate and Rhythm: Normal rate and regular rhythm.     Heart sounds: Normal heart sounds. No murmur heard.   Pulmonary:     Effort: Pulmonary effort is normal.     Breath sounds: Normal breath sounds.  Abdominal:     General: There is no distension.     Palpations: Abdomen is soft.     Tenderness: There is no abdominal tenderness.  Musculoskeletal:        General: No swelling.     Cervical back: Neck supple.  Lymphadenopathy:     Cervical: No cervical adenopathy.  Skin:    General: Skin is warm and dry.      Assessment/Plan  History of gastroduodenal anastomotic ulcer. Esophagogastroduodenoscopy to document complete healing  Lionel December, MD 06/05/2020, 1:11 PM

## 2020-06-05 NOTE — Op Note (Signed)
[facility] Patient Name: [name] Procedure Date: 06/05/2020 1:00 PM MRN: [id number] Date of Birth: 10-Jun-1960 Attending MD: Lionel December , MD CSN: [id number] Age: 60 Admit Type: Outpatient Procedure:                Upper GI endoscopy Indications:              Gastrojejunal ulcer, Follow-up of gastrojejunal                            ulcer Providers:                Lionel December, MD, Angelica Ran, Cyril Mourning, Technician Referring MD:             Assunta Found, MD Medicines:                Propofol per Anesthesia Complications:            No immediate complications. Estimated Blood Loss:     Estimated blood loss: none. Procedure:                Pre-Anesthesia Assessment:                           - Prior to the procedure, a History and Physical                            was performed, and patient medications and                            allergies were reviewed. The patient's tolerance of                            previous anesthesia was also reviewed. The risks                            and benefits of the procedure and the sedation                            options and risks were discussed with the patient.                            All questions were answered, and informed consent                            was obtained. Prior Anticoagulants: The patient has                            taken no previous anticoagulant or antiplatelet                            agents. ASA Grade Assessment: II - A patient with                            mild  systemic disease. After reviewing the risks                            and benefits, the patient was deemed in                            satisfactory condition to undergo the procedure.                           After obtaining informed consent, the endoscope was                            passed under direct vision. Throughout the                            procedure, the patient's blood  pressure, pulse, and                            oxygen saturations were monitored continuously. The                            GIF-H190 ([id number]) scope was introduced through the                            mouth, and advanced to the mid-jejunum. The upper                            GI endoscopy was accomplished without difficulty.                            The patient tolerated the procedure well. Scope In: 1:18:35 PM Scope Out: 1:22:48 PM Total Procedure Duration: 0 hours 4 minutes 13 seconds  Findings:      The hypopharynx was normal.      The examined esophagus was normal.      The Z-line was irregular and was found 40 cm from the incisors.      Evidence of a Roux-en-Y gastrojejunostomy was found. The gastrojejunal       anastomosis was characterized by edema and the presence of no stomal       ulceration. This was traversed. The pouch-to-jejunum limb was       characterized by healthy appearing mucosa.      Small gastric pouch with normal mucosa except 2 scars noted proximal to       anastomosis indicative of healed ulcers.      The examined jejunum was normal. Impression:               - Normal hypopharynx.                           - Normal esophagus.                           - Z-line irregular, 40 cm from the incisors.                           - Roux-en-Y  gastrojejunostomy with gastrojejunal                            anastomosis characterized by edema. Anastomotic                            ulcer has completely healed.                           -Scars proximal to gastrojejunal anastomosis                            indicative of healed ulcers.                           - Normal examined jejunum.                           - No specimens collected. Moderate Sedation:      Per Anesthesia Care Recommendation:           - Patient has a contact number available for                            emergencies. The signs and symptoms of potential                            delayed  complications were discussed with the                            patient. Return to normal activities tomorrow.                            Written discharge instructions were provided to the                            patient.                           - Resume previous diet today.                           -Decrease pantoprazole to 40 mg p.o. every morning                            but continue other medications as before. Procedure Code(s):        --- Professional ---                           [phone], Esophagogastroduodenoscopy, flexible,                            transoral; diagnostic, including collection of                            specimen(s) by brushing or washing, when performed                            (  separate procedure) Diagnosis Code(s):        --- Professional ---                           K22.8, Other specified diseases of esophagus                           Z98.0, Intestinal bypass and anastomosis status                           K31.89, Other diseases of stomach and duodenum                           K28.9, Gastrojejunal ulcer, unspecified as acute or                            chronic, without hemorrhage or perforation CPT copyright 2019 American Medical Association. All rights reserved. The codes documented in this report are preliminary and upon coder review may  be revised to meet current compliance requirements. Lionel DecemberNajeeb Ziara Thelander, MD Lionel DecemberNajeeb Marcellene Shivley, MD 06/05/2020 1:39:35 PM This report has been signed electronically. Number of Addenda: 0

## 2020-06-05 NOTE — Discharge Instructions (Signed)
Upper Endoscopy, Adult, Care After This sheet gives you information about how to care for yourself after your procedure. Your health care provider may also give you more specific instructions. If you have problems or questions, contact your health care provider. What can I expect after the procedure? After the procedure, it is common to have:  A sore throat.  Mild stomach pain or discomfort.  Bloating.  Nausea. Follow these instructions at home:  Follow instructions from your health care provider about what to eat or drink after your procedure.  Return to your normal activities as told by your health care provider. Ask your health care provider what activities are safe for you.  Take over-the-counter and prescription medicines only as told by your health care provider.  If you were given a sedative during the procedure, it can affect you for several hours. Do not drive or operate machinery until your health care provider says that it is safe.  Keep all follow-up visits as told by your health care provider. This is important.   Contact a health care provider if you have:  A sore throat that lasts longer than one day.  Trouble swallowing. Get help right away if:  You vomit blood or your vomit looks like coffee grounds.  You have: ? A fever. ? Bloody, black, or tarry stools. ? A severe sore throat or you cannot swallow. ? Difficulty breathing. ? Severe pain in your chest or abdomen. Summary  After the procedure, it is common to have a sore throat, mild stomach discomfort, bloating, and nausea.  If you were given a sedative during the procedure, it can affect you for several hours. Do not drive or operate machinery until your health care provider says that it is safe.  Follow instructions from your health care provider about what to eat or drink after your procedure.  Return to your normal activities as told by your health care provider. This information is not intended to  replace advice given to you by your health care provider. Make sure you discuss any questions you have with your health care provider. Document Revised: 01/24/2019 Document Reviewed: 06/28/2017 Elsevier Patient Education  2021 Elsevier Inc. Decrease pantoprazole to once daily.  Take it 30 minutes before breakfast daily. Resume other medications as before. Resume usual diet. No driving for 24 hours.

## 2020-06-05 NOTE — Anesthesia Procedure Notes (Signed)
Date/Time: 06/05/2020 1:20 PM Performed by: Julian Reil, CRNA Pre-anesthesia Checklist: Patient identified, Emergency Drugs available, Suction available and Patient being monitored Oxygen Delivery Method: Nasal cannula Induction Type: IV induction Placement Confirmation: positive ETCO2

## 2020-06-05 NOTE — Anesthesia Postprocedure Evaluation (Signed)
Anesthesia Post Note  Patient: [name]  Procedure(s) Performed: ESOPHAGOGASTRODUODENOSCOPY (EGD) WITH PROPOFOL (N/A )  Patient location during evaluation: Phase II Anesthesia Type: General Level of consciousness: awake and alert and oriented Pain management: pain level controlled Vital Signs Assessment: post-procedure vital signs reviewed and stable Respiratory status: spontaneous breathing and respiratory function stable Cardiovascular status: stable and blood pressure returned to baseline Postop Assessment: no apparent nausea or vomiting Anesthetic complications: no   No complications documented.   Last Vitals:  Vitals:   06/05/20 1144 06/05/20 1328  BP: (!) 147/76 (!) 118/53  Pulse: 64 78  Resp: 13 (!) 22  Temp: 37.1 C 36.6 C  SpO2: 99% 99%    Last Pain:  Vitals:   06/05/20 1328  TempSrc: Oral  PainSc:                  Roy Snuffer C Fareedah Mahler

## 2020-06-05 NOTE — Transfer of Care (Signed)
Immediate Anesthesia Transfer of Care Note  Patient: [name]  Procedure(s) Performed: ESOPHAGOGASTRODUODENOSCOPY (EGD) WITH PROPOFOL (N/A )  Patient Location: Short Stay  Anesthesia Type:General  Level of Consciousness: awake, alert  and oriented  Airway & Oxygen Therapy: Patient Spontanous Breathing  Post-op Assessment: Report given to RN, Post -op Vital signs reviewed and stable and Patient moving all extremities X 4  Post vital signs: Reviewed and stable  Last Vitals:  Vitals Value Taken Time  BP 118/53 06/05/20 1328  Temp 36.6 C 06/05/20 1328  Pulse 78 06/05/20 1328  Resp 22 06/05/20 1328  SpO2 99 % 06/05/20 1328    Last Pain:  Vitals:   06/05/20 1328  TempSrc: Oral  PainSc:          Complications: No complications documented.

## 2020-06-05 NOTE — Anesthesia Preprocedure Evaluation (Signed)
Anesthesia Evaluation  Patient identified by MRN, date of birth, ID band Patient awake    Reviewed: Allergy & Precautions, NPO status , Patient's Chart, lab work & pertinent test results  History of Anesthesia Complications (+) PONV and history of anesthetic complications  Airway Mallampati: II  TM Distance: >3 FB Neck ROM: Full    Dental  (+) Dental Advisory Given, Teeth Intact   Pulmonary neg pulmonary ROS,    Pulmonary exam normal breath sounds clear to auscultation       Cardiovascular Exercise Tolerance: Good hypertension, Pt. on medications Normal cardiovascular exam Rhythm:Regular Rate:Normal     Neuro/Psych PSYCHIATRIC DISORDERS Anxiety Depression negative neurological ROS     GI/Hepatic Neg liver ROS, PUD, Medicated and Controlled,  Endo/Other  Hypothyroidism   Renal/GU negative Renal ROS     Musculoskeletal negative musculoskeletal ROS (+)   Abdominal   Peds  Hematology  (+) anemia ,   Anesthesia Other Findings   Reproductive/Obstetrics negative OB ROS                            Anesthesia Physical Anesthesia Plan  ASA: II  Anesthesia Plan: General   Post-op Pain Management:    Induction: Intravenous  PONV Risk Score and Plan: Propofol infusion  Airway Management Planned: Nasal Cannula and Natural Airway  Additional Equipment:   Intra-op Plan:   Post-operative Plan:   Informed Consent: I have reviewed the patients History and Physical, chart, labs and discussed the procedure including the risks, benefits and alternatives for the proposed anesthesia with the patient or authorized representative who has indicated his/her understanding and acceptance.     Dental advisory given  Plan Discussed with: CRNA and Surgeon  Anesthesia Plan Comments:         Anesthesia Quick Evaluation

## 2020-06-07 ENCOUNTER — Encounter (HOSPITAL_COMMUNITY): Payer: Self-pay | Admitting: Internal Medicine

## 2021-03-28 ENCOUNTER — Other Ambulatory Visit: Payer: Self-pay

## 2021-03-28 ENCOUNTER — Encounter (HOSPITAL_COMMUNITY): Payer: Self-pay | Admitting: Hematology

## 2021-03-28 ENCOUNTER — Inpatient Hospital Stay (HOSPITAL_COMMUNITY)

## 2021-03-28 ENCOUNTER — Inpatient Hospital Stay (HOSPITAL_COMMUNITY): Attending: Hematology | Admitting: Hematology

## 2021-03-28 DIAGNOSIS — Z9884 Bariatric surgery status: Secondary | ICD-10-CM | POA: Diagnosis not present

## 2021-03-28 DIAGNOSIS — Z9049 Acquired absence of other specified parts of digestive tract: Secondary | ICD-10-CM | POA: Diagnosis not present

## 2021-03-28 DIAGNOSIS — Z800 Family history of malignant neoplasm of digestive organs: Secondary | ICD-10-CM

## 2021-03-28 DIAGNOSIS — G479 Sleep disorder, unspecified: Secondary | ICD-10-CM | POA: Diagnosis not present

## 2021-03-28 DIAGNOSIS — Z885 Allergy status to narcotic agent status: Secondary | ICD-10-CM | POA: Diagnosis not present

## 2021-03-28 DIAGNOSIS — Z79899 Other long term (current) drug therapy: Secondary | ICD-10-CM

## 2021-03-28 DIAGNOSIS — R197 Diarrhea, unspecified: Secondary | ICD-10-CM | POA: Diagnosis not present

## 2021-03-28 DIAGNOSIS — Z90721 Acquired absence of ovaries, unilateral: Secondary | ICD-10-CM | POA: Diagnosis not present

## 2021-03-28 DIAGNOSIS — R5383 Other fatigue: Secondary | ICD-10-CM

## 2021-03-28 DIAGNOSIS — D649 Anemia, unspecified: Secondary | ICD-10-CM | POA: Diagnosis not present

## 2021-03-28 DIAGNOSIS — Z90710 Acquired absence of both cervix and uterus: Secondary | ICD-10-CM | POA: Diagnosis not present

## 2021-03-28 DIAGNOSIS — Z886 Allergy status to analgesic agent status: Secondary | ICD-10-CM

## 2021-03-28 DIAGNOSIS — Z803 Family history of malignant neoplasm of breast: Secondary | ICD-10-CM

## 2021-03-28 DIAGNOSIS — Z91030 Bee allergy status: Secondary | ICD-10-CM | POA: Diagnosis not present

## 2021-03-28 DIAGNOSIS — R42 Dizziness and giddiness: Secondary | ICD-10-CM

## 2021-03-28 LAB — CBC WITH DIFFERENTIAL/PLATELET
Abs Immature Granulocytes: 0.02 10*3/uL (ref 0.00–0.07)
Basophils Absolute: 0 10*3/uL (ref 0.0–0.1)
Basophils Relative: 1 %
Eosinophils Absolute: 0.1 10*3/uL (ref 0.0–0.5)
Eosinophils Relative: 2 %
HCT: 42.9 % (ref 36.0–46.0)
Hemoglobin: 13 g/dL (ref 12.0–15.0)
Immature Granulocytes: 0 %
Lymphocytes Relative: 28 %
Lymphs Abs: 1.4 10*3/uL (ref 0.7–4.0)
MCH: 28.3 pg (ref 26.0–34.0)
MCHC: 30.3 g/dL (ref 30.0–36.0)
MCV: 93.5 fL (ref 80.0–100.0)
Monocytes Absolute: 0.3 10*3/uL (ref 0.1–1.0)
Monocytes Relative: 6 %
Neutro Abs: 3.1 10*3/uL (ref 1.7–7.7)
Neutrophils Relative %: 63 %
Platelets: 194 10*3/uL (ref 150–400)
RBC: 4.59 MIL/uL (ref 3.87–5.11)
RDW: 13.8 % (ref 11.5–15.5)
WBC: 5 10*3/uL (ref 4.0–10.5)
nRBC: 0 % (ref 0.0–0.2)

## 2021-03-28 LAB — RETICULOCYTES
Immature Retic Fract: 9 % (ref 2.3–15.9)
RBC.: 4.49 MIL/uL (ref 3.87–5.11)
Retic Count, Absolute: 77.7 10*3/uL (ref 19.0–186.0)
Retic Ct Pct: 1.7 % (ref 0.4–3.1)

## 2021-03-28 LAB — FERRITIN: Ferritin: 45 ng/mL (ref 11–307)

## 2021-03-28 LAB — IRON AND TIBC
Iron: 39 ug/dL (ref 28–170)
Saturation Ratios: 11 % (ref 10.4–31.8)
TIBC: 345 ug/dL (ref 250–450)
UIBC: 306 ug/dL

## 2021-03-28 LAB — LACTATE DEHYDROGENASE: LDH: 137 U/L (ref 98–192)

## 2021-03-28 LAB — FOLATE: Folate: 23.1 ng/mL (ref >5.9)

## 2021-03-28 LAB — VITAMIN B12: Vitamin B-12: 2816 pg/mL — ABNORMAL HIGH (ref 180–914)

## 2021-03-28 NOTE — Patient Instructions (Addendum)
[facility] Discharge Instructions  You were seen and examined today by Dr. Ellin Saba. Dr. Ellin Saba is a hematologist, meaning that he specializes in blood abnormalities. Dr. Ellin Saba discussed your past medical history, family history of cancers/blood conditions and the events that led to you being here today.  You were referred to Dr. Ellin Saba due to anemia. Dr. Ellin Saba has recommended additional lab work today in order to identify the cause of your anemia. It is possible that your gastric bypass has decreased your body's ability to absorb iron.  Dr. Ellin Saba has recommended testing your stool on three difference occasions to ensure that you are not losing blood through your stool.   Follow-up as scheduled.   Thank you for choosing [facility] at [facility] to provide your oncology and hematology care.  To afford each patient quality time with our provider, please arrive at least 15 minutes before your scheduled appointment time.   If you have a lab appointment with the Cancer Center please come in thru the Main Entrance and check in at the main information desk.  You need to re-schedule your appointment should you arrive 10 or more minutes late.  We strive to give you quality time with our providers, and arriving late affects you and other patients whose appointments are after yours.  Also, if you no show three or more times for appointments you may be dismissed from the clinic at the providers discretion.     Again, thank you for choosing [facility].  Our hope is that these requests will decrease the amount of time that you wait before being seen by our physicians.       _____________________________________________________________  Should you have questions after your visit to [facility], please contact our office at [phone] and follow the prompts.  Our office hours are 8:00 a.m. and  4:30 p.m. Monday - Friday.  Please note that voicemails left after 4:00 p.m. may not be returned until the following business day.  We are closed weekends and major holidays.  You do have access to a nurse 24-7, just call the main number to the clinic [phone] and do not press any options, hold on the line and a nurse will answer the phone.    For prescription refill requests, have your pharmacy contact our office and allow 72 hours.    Due to Covid, you will need to wear a mask upon entering the hospital. If you do not have a mask, a mask will be given to you at the Main Entrance upon arrival. For doctor visits, patients may have 1 support person age 75 or older with them. For treatment visits, patients can not have anyone with them due to social distancing guidelines and our immunocompromised population.

## 2021-03-28 NOTE — Progress Notes (Signed)
[facility] [address], [place] [address]   CLINIC:  Medical Oncology/Hematology  Patient Care Team: Sharilyn Sites, MD as PCP - General (Family Medicine) Danie Binder, MD (Inactive) (Gastroenterology) Derek Jack, MD as Medical Oncologist (Hematology)  CHIEF COMPLAINTS/PURPOSE OF CONSULTATION:  Evaluation of anemia  HISTORY OF PRESENTING ILLNESS:  [name] 61 y.o. female is here because of evaluation of anemia, at the request of [facility].  Today she reports feeling good. She had gastric bypass surgery in December 2013 at which time she lost 200 lbs and has not gained back any weight. She denies history of DM. She takes vitamin B-12 and iron tablets. She is not currently taking folic acid. She denies hematuria, hematochezia, and black stools. She reports increased fatigue, and she denies ice pica. She had 4 units of blood in December 2021 following an episode of severe light-headedness and dizziness. She denies history of DVT. Her last colonoscopy was in July 2020.   She currently lives at home, and she is active at home. Prior to retirement she worked in [employer] and at [employer] where she reports potential chemical exposure, but she cannot recall what kind. She denies smoking history. She denies family history of anemia. Her mother had breast cancer, and her maternal cousin has colon cancer.   MEDICAL HISTORY:  Past Medical History:  Diagnosis Date   Depression    started Lexapro when husband passed away   Hypertension    Hypothyroidism    IBS (irritable bowel syndrome)    PONV (postoperative nausea and vomiting)     SURGICAL HISTORY: Past Surgical History:  Procedure Laterality Date   ABDOMINAL HYSTERECTOMY  08/11/2011   Procedure: HYSTERECTOMY ABDOMINAL;  Surgeon: Jonnie Kind, MD;  [facility];  Service: Gynecology;  Laterality: N/A;   BIOPSY  01/11/2020   Procedure: BIOPSY;  Surgeon: Daneil Dolin, MD;  [facility];  Service: Endoscopy;;   CESAREAN SECTION     x 2   CHOLECYSTECTOMY  2009   [facility]   ESOPHAGOGASTRODUODENOSCOPY  Jan 2004   RMR: normal esophagus, small hiatal hernia, normal D1, D2   ESOPHAGOGASTRODUODENOSCOPY (EGD) WITH PROPOFOL N/A 01/11/2020   Procedure: ESOPHAGOGASTRODUODENOSCOPY (EGD) WITH PROPOFOL;  Surgeon: Daneil Dolin, MD;  [facility];  Service: Endoscopy;  Laterality: N/A;   ESOPHAGOGASTRODUODENOSCOPY (EGD) WITH PROPOFOL N/A 06/05/2020   Procedure: ESOPHAGOGASTRODUODENOSCOPY (EGD) WITH PROPOFOL;  Surgeon: Rogene Houston, MD;  [facility];  Service: Endoscopy;  Laterality: N/A;  am   FOOT SURGERY     x 5 right   GASTRIC BYPASS     gastric stent  2020   HAND SURGERY     x2 right   HEMATOMA EVACUATION  08/14/2011   Procedure: EVACUATION HEMATOMA;  Surgeon: Jonnie Kind, MD;  [facility]: [facility];  Service: Gynecology;  Laterality: N/A;   SALPINGOOPHORECTOMY  08/11/2011   Procedure: SALPINGO OOPHERECTOMY;  Surgeon: Jonnie Kind, MD;  Location: [facility];  Service: Gynecology;  Laterality: Bilateral;   SCAR REVISION  08/11/2011   Procedure: SCAR REVISION;  Surgeon: Jonnie Kind, MD;  [facility]: [facility];  Service: Gynecology;  Laterality: N/A;  Excision of Wide Cicatrix    SOCIAL HISTORY: Social History   Socioeconomic History   Marital status: Widowed    Spouse name: Not on file   Number of children: Not on file   Years of education: Not on file   Highest  education level: Not on file  Occupational History   Not on file  Tobacco Use   Smoking status: Never   Smokeless tobacco: Never  Substance and Sexual Activity   Alcohol use: No   Drug use: No   Sexual activity: Never  Other Topics Concern   Not on file  Social History Narrative   Husband passed away 8 years ago from [employer].    Social Determinants of Health   Financial Resource Strain: Not on file  Food Insecurity: Not on file  Transportation Needs: Not on file   Physical Activity: Not on file  Stress: Not on file  Social Connections: Not on file  Intimate Partner Violence: Not on file    FAMILY HISTORY: Family History  Problem Relation Age of Onset   Colon cancer Neg Hx     ALLERGIES:  is allergic to bee pollen, bee venom, hydrocodone-acetaminophen, chocolate, nsaids, synthroid [levothyroxine sodium], latex, and neosporin [neomycin-bacitracin zn-polymyx].  MEDICATIONS:  Current Outpatient Medications  Medication Sig Dispense Refill   ALPRAZolam (XANAX) 0.5 MG tablet Take 0.5 mg by mouth at bedtime.     Biotin 5 MG TABS Take 5 mg by mouth daily.     bismuth subsalicylate (PEPTO BISMOL) 262 MG/15ML suspension Take 30 mLs by mouth every 6 (six) hours as needed for diarrhea or loose stools or indigestion.     diphenhydrAMINE (BENADRYL) 25 mg capsule Take 50 mg by mouth in the morning and at bedtime.     levothyroxine (SYNTHROID, LEVOTHROID) 125 MCG tablet Take 125 mcg by mouth daily before breakfast.      Multiple Minerals-Vitamins (CALCIUM-MAGNESIUM-ZINC-D3) TABS Take 1 tablet by mouth daily.     pantoprazole (PROTONIX) 40 MG tablet Take 1 tablet (40 mg total) by mouth daily before breakfast. 90 tablet 3   Pediatric Multivitamins-Iron (FLINTSTONES COMPLETE) 18 MG CHEW Chew 1 tablet by mouth in the morning and at bedtime. (Patient taking differently: Chew 2 tablets by mouth daily.) 180 tablet 3   promethazine (PHENERGAN) 25 MG tablet Take 25 mg by mouth every 6 (six) hours as needed for vomiting or nausea.     traMADol (ULTRAM) 50 MG tablet Take 50 mg by mouth every 4 (four) hours as needed for pain.     vitamin B-12 1000 MCG tablet Take 1 tablet (1,000 mcg total) by mouth daily. (Patient taking differently: Take 3,000 mcg by mouth daily.) 30 tablet 3   vitamin C (ASCORBIC ACID) 500 MG tablet Take 500 mg by mouth daily.     Vitamin D, Ergocalciferol, (DRISDOL) 1.25 MG (50000 UNIT) CAPS capsule Take 50,000 Units by mouth once a week.     No  current facility-administered medications for this visit.    REVIEW OF SYSTEMS:   Review of Systems  Constitutional:  Positive for fatigue. Negative for appetite change.  Gastrointestinal:  Positive for diarrhea. Negative for blood in stool.  Genitourinary:  Negative for hematuria.   Psychiatric/Behavioral:  Positive for sleep disturbance.   All other systems reviewed and are negative.   PHYSICAL EXAMINATION: ECOG PERFORMANCE STATUS: 1 - Symptomatic but completely ambulatory  Vitals:   03/28/21 0938  BP: (!) 144/92  Pulse: 92  Resp: 19  Temp: 98 F (36.7 C)  SpO2: 99%   Filed Weights   03/28/21 0938  Weight: 176 lb 5.9 oz (80 kg)   Physical Exam Vitals reviewed.  Constitutional:      Appearance: Normal appearance.  Cardiovascular:     Rate and Rhythm: Normal rate  and regular rhythm.     Pulses: Normal pulses.     Heart sounds: Normal heart sounds.  Pulmonary:     Effort: Pulmonary effort is normal.     Breath sounds: Normal breath sounds.  Abdominal:     Palpations: Abdomen is soft. There is no hepatomegaly, splenomegaly or mass.     Tenderness: There is no abdominal tenderness.  Musculoskeletal:     Right lower leg: 1+ Edema present.     Left lower leg: No edema.  Neurological:     General: No focal deficit present.     Mental Status: She is alert and oriented to person, place, and time.  Psychiatric:        Mood and Affect: Mood normal.        Behavior: Behavior normal.     LABORATORY DATA:  I have reviewed the data as listed No results found for this or any previous visit (from the past 2160 hour(s)).  RADIOGRAPHIC STUDIES: I have personally reviewed the radiological images as listed and agreed with the findings in the report. No results found.  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 Hilma Favors.  CBC on 11/18/2020 hemoglobin 10.3 with MCV 92.  Normal white count and platelet  count. - History of gastric bypass in 2013, successfully lost 200+ pounds. - She takes B12 and iron pills daily. - Denies any bleeding per rectum or melena. - Received 4 units of PRBC in December 2021 when EGD showed anastomotic ulceration at the gastrojejunostomy site. - Repeat EGD on 06/05/2020 showed normal esophagus with healed anastomotic ulcer and normal jejunum. - Colonoscopy on file on 07/14/2011 with mild diverticulosis in descending and sigmoid colon. - Patient reportedly underwent colon[facility] at [facility] in 2020 which was normal.   Social/family history: - She is independent of all ADLs and IADLs.  She worked in [employer].  She also worked at [employer] for few years and exposed to dyes.  No history of smoking. - Mother had breast cancer.  Maternal aunt had cancer.  Maternal cousin has colorectal cancer.   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 - Recommend repeating CBC and checking for nutritional deficiencies.  We will also check LDH and SPEP.  We will check stool for occult blood. - She will follow-up with [place] in 1 to 2 weeks.  I have also discussed the need for parenteral iron therapy or B12 supplementation based on the labs.   All questions were answered. The patient knows to call the clinic with any problems, questions or concerns.  Derek Jack, MD 03/28/21 10:11 AM  [facility] [phone]   I, Thana Ates, am acting as a scribe for Dr. Derek Jack.  I, Derek Jack MD, have reviewed the above documentation for accuracy and completeness, and I agree with the above.

## 2021-03-30 LAB — COPPER, SERUM: Copper: 51 ug/dL — ABNORMAL LOW (ref 80–158)

## 2021-03-31 LAB — PROTEIN ELECTROPHORESIS, SERUM
A/G Ratio: 0.9 (ref 0.7–1.7)
Albumin ELP: 2.8 g/dL — ABNORMAL LOW (ref 2.9–4.4)
Alpha-1-Globulin: 0.2 g/dL (ref 0.0–0.4)
Alpha-2-Globulin: 0.6 g/dL (ref 0.4–1.0)
Beta Globulin: 1.2 g/dL (ref 0.7–1.3)
Gamma Globulin: 1.2 g/dL (ref 0.4–1.8)
Globulin, Total: 3.2 g/dL (ref 2.2–3.9)
Total Protein ELP: 6 g/dL (ref 6.0–8.5)

## 2021-04-07 ENCOUNTER — Other Ambulatory Visit (HOSPITAL_COMMUNITY): Payer: Self-pay | Admitting: *Deleted

## 2021-04-07 DIAGNOSIS — D649 Anemia, unspecified: Secondary | ICD-10-CM

## 2021-04-07 LAB — OCCULT BLOOD X 1 CARD TO LAB, STOOL
Fecal Occult Bld: NEGATIVE
Fecal Occult Bld: NEGATIVE
Fecal Occult Bld: NEGATIVE

## 2021-04-10 NOTE — Progress Notes (Signed)
[facility] [address] [place], [place] [address]   CLINIC:  Medical Oncology/Hematology  PCP:  Assunta FoundGolding, John, MD [address] [place] [address] [phone]   REASON FOR VISIT:  Follow-up for normocytic anemia  PRIOR THERAPY: PRBC transfusion x4 (December 2021)  CURRENT THERAPY: Under work-up  INTERVAL HISTORY:  [name] 61 y.o. female returns for routine follow-up of her normocytic anemia.  She was seen for initial consultation by Dr. Ellin SabaKatragadda on 03/28/2021 and returns today to discuss results and next steps.  At today's visit, she reports feeling fairly well.  No recent hospitalizations, surgeries, or changes in baseline health status.  She denies any bright of blood per rectum, melena, or epistaxis.  She does report fatigue and states that she feels like she is getting tired easier than she used to.  She has cravings for ice chips.  She denies any restless legs, headaches, chest pain, dyspnea on exertion, lightheadedness, or syncope.  She has 70% energy and 75% appetite. She endorses that she is maintaining a stable weight.   REVIEW OF SYSTEMS:  Review of Systems  Constitutional:  Negative for appetite change, chills, diaphoresis, fatigue, fever and unexpected weight change.  HENT:   Negative for lump/mass and nosebleeds.   Eyes:  Negative for eye problems.  Respiratory:  Negative for cough, hemoptysis and shortness of breath.   Cardiovascular:  Negative for chest pain, leg swelling and palpitations.  Gastrointestinal:  Positive for diarrhea and nausea. Negative for abdominal pain, blood in stool, constipation and vomiting.  Genitourinary:  Negative for hematuria.   Skin: Negative.   Neurological:  Negative for dizziness, headaches and light-headedness.  Hematological:  Does not bruise/bleed easily.     PAST MEDICAL/SURGICAL HISTORY:  Past Medical History:  Diagnosis Date   Depression    started Lexapro when husband passed away    Hypertension    Hypothyroidism    IBS (irritable bowel syndrome)    PONV (postoperative nausea and vomiting)    Past Surgical History:  Procedure Laterality Date   ABDOMINAL HYSTERECTOMY  08/11/2011   Procedure: HYSTERECTOMY ABDOMINAL;  Surgeon: Tilda BurrowJohn V Ferguson, MD;  [facility];  Service: Gynecology;  Laterality: N/A;   BIOPSY  01/11/2020   Procedure: BIOPSY;  Surgeon: [name], Robert M, MD;  [facility];  Service: Endoscopy;;   CESAREAN SECTION     x 2   CHOLECYSTECTOMY  2009   [facility]   ESOPHAGOGASTRODUODENOSCOPY  Jan 2004   RMR: normal esophagus, small hiatal hernia, normal D1, D2   ESOPHAGOGASTRODUODENOSCOPY (EGD) WITH PROPOFOL N/A 01/11/2020   Procedure: ESOPHAGOGASTRODUODENOSCOPY (EGD) WITH PROPOFOL;  Surgeon: [name], Robert M, MD;  [facility];  Service: Endoscopy;  Laterality: N/A;   ESOPHAGOGASTRODUODENOSCOPY (EGD) WITH PROPOFOL N/A 06/05/2020   Procedure: ESOPHAGOGASTRODUODENOSCOPY (EGD) WITH PROPOFOL;  Surgeon: [name], Najeeb U, MD;  [facility];  Service: Endoscopy;  Laterality: N/A;  am   FOOT SURGERY     x 5 right   GASTRIC BYPASS     gastric stent  2020   HAND SURGERY     x2 right   HEMATOMA EVACUATION  08/14/2011   Procedure: EVACUATION HEMATOMA;  Surgeon: Tilda BurrowJohn V Ferguson, MD;  [facility];  Service: Gynecology;  Laterality: N/A;   SALPINGOOPHORECTOMY  08/11/2011   Procedure: SALPINGO OOPHERECTOMY;  Surgeon: Tilda BurrowJohn V Ferguson, MD;  [facility];  Service: Gynecology;  Laterality: Bilateral;   SCAR REVISION  08/11/2011   Procedure: SCAR REVISION;  Surgeon: [name], MD;  [facility]: [facility];  Service: Gynecology;  Laterality: N/A;  Excision of Wide Cicatrix     SOCIAL HISTORY:  Social History   Socioeconomic History   Marital status: Widowed    Spouse name: Not on file   Number of children: Not on file   Years of education: Not on file   Highest education level: Not on file  Occupational History   Not on file  Tobacco  Use   Smoking status: Never   Smokeless tobacco: Never  Substance and Sexual Activity   Alcohol use: No   Drug use: No   Sexual activity: Never  Other Topics Concern   Not on file  Social History Narrative   Husband passed away 8 years ago from [employer].    Social Determinants of Health   Financial Resource Strain: Not on file  Food Insecurity: Not on file  Transportation Needs: Not on file  Physical Activity: Not on file  Stress: Not on file  Social Connections: Not on file  Intimate Partner Violence: Not on file    FAMILY HISTORY:  Family History  Problem Relation Age of Onset   Colon cancer Neg Hx     CURRENT MEDICATIONS:  Outpatient Encounter Medications as of 04/11/2021  Medication Sig   ALPRAZolam (XANAX) 0.5 MG tablet Take 0.5 mg by mouth at bedtime.   Biotin 5 MG TABS Take 5 mg by mouth daily.   bismuth subsalicylate (PEPTO BISMOL) 262 MG/15ML suspension Take 30 mLs by mouth every 6 (six) hours as needed for diarrhea or loose stools or indigestion.   diphenhydrAMINE (BENADRYL) 25 mg capsule Take 50 mg by mouth in the morning and at bedtime.   levothyroxine (SYNTHROID, LEVOTHROID) 125 MCG tablet Take 125 mcg by mouth daily before breakfast.    Multiple Minerals-Vitamins (CALCIUM-MAGNESIUM-ZINC-D3) TABS Take 1 tablet by mouth daily.   pantoprazole (PROTONIX) 40 MG tablet Take 1 tablet (40 mg total) by mouth daily before breakfast.   Pediatric Multivitamins-Iron (FLINTSTONES COMPLETE) 18 MG CHEW Chew 1 tablet by mouth in the morning and at bedtime. (Patient taking differently: Chew 2 tablets by mouth daily.)   promethazine (PHENERGAN) 25 MG tablet Take 25 mg by mouth every 6 (six) hours as needed for vomiting or nausea.   traMADol (ULTRAM) 50 MG tablet Take 50 mg by mouth every 4 (four) hours as needed for pain.   vitamin B-12 1000 MCG tablet Take 1 tablet (1,000 mcg total) by mouth daily. (Patient taking differently: Take 3,000 mcg by mouth daily.)   vitamin C  (ASCORBIC ACID) 500 MG tablet Take 500 mg by mouth daily.   Vitamin D, Ergocalciferol, (DRISDOL) 1.25 MG (50000 UNIT) CAPS capsule Take 50,000 Units by mouth once a week.   No facility-administered encounter medications on file as of 04/11/2021.    ALLERGIES:  Allergies  Allergen Reactions   Bee Pollen Anaphylaxis   Bee Venom Anaphylaxis and Itching    Swelling and itching   Hydrocodone-Acetaminophen Itching   Chocolate Other (See Comments)    Reaction: causes facial blistering,inside mouth   Nsaids Nausea And Vomiting   Synthroid [Levothyroxine Sodium] Itching and Swelling    Patient has a reaction to brand name synthroid only   Latex Rash   Neosporin [Neomycin-Bacitracin Zn-Polymyx] Rash     PHYSICAL EXAM:  ECOG PERFORMANCE STATUS: 1 - Symptomatic but completely ambulatory  There were no vitals filed for this visit. There were no vitals filed for this visit. Physical Exam  Constitutional:      Appearance: Normal appearance.  HENT:     Head: Normocephalic and atraumatic.     Mouth/Throat:     Mouth: Mucous membranes are moist.  Eyes:     Extraocular Movements: Extraocular movements intact.     Pupils: Pupils are equal, round, and reactive to light.  Cardiovascular:     Rate and Rhythm: Normal rate and regular rhythm.     Pulses: Normal pulses.     Heart sounds: Normal heart sounds.  Pulmonary:     Effort: Pulmonary effort is normal.     Breath sounds: Normal breath sounds.  Abdominal:     General: Bowel sounds are normal.     Palpations: Abdomen is soft.     Tenderness: There is no abdominal tenderness.  Musculoskeletal:        General: No swelling.     Right lower leg: No edema.     Left lower leg: No edema.  Lymphadenopathy:     Cervical: No cervical adenopathy.  Skin:    General: Skin is warm and dry.  Neurological:     General: No focal deficit present.     Mental Status: She is alert and oriented to person, place, and time.  Psychiatric:        Mood and  Affect: Mood normal.        Behavior: Behavior normal.     LABORATORY DATA:  I have reviewed the labs as listed.  CBC    Component Value Date/Time   WBC 5.0 03/28/2021 1010   RBC 4.49 03/28/2021 1010   RBC 4.59 03/28/2021 1010   HGB 13.0 03/28/2021 1010   HCT 42.9 03/28/2021 1010   PLT 194 03/28/2021 1010   MCV 93.5 03/28/2021 1010   MCH 28.3 03/28/2021 1010   MCHC 30.3 03/28/2021 1010   RDW 13.8 03/28/2021 1010   LYMPHSABS 1.4 03/28/2021 1010   MONOABS 0.3 03/28/2021 1010   EOSABS 0.1 03/28/2021 1010   BASOSABS 0.0 03/28/2021 1010   CMP Latest Ref Rng & Units 01/12/2020 01/11/2020 01/10/2020  Glucose 70 - 99 mg/dL 86 83 403(K)  BUN 6 - 20 mg/dL 9 14 18   Creatinine 0.44 - 1.00 mg/dL 7.42 5.95  Sodium 135 - 145 mmol/L 139 137 137  Potassium 3.5 - 5.1 mmol/L 3.4(L) 3.8 4.3  Chloride 98 - 111 mmol/L 109 109 109  CO2 22 - 32 mmol/L 23 23 23   Calcium 8.9 - 10.3 mg/dL 8.0(L) 8.1(L) 7.7(L)  Total Protein 6.5 - 8.1 g/dL 4.8(L) 4.8(L) 4.6(L)  Total Bilirubin 0.3 - 1.2 mg/dL 0.9 1.0 6.38)  Alkaline Phos 38 - 126 U/L 171(H) 173(H) 188(H)  AST 15 - 41 U/L 30 39 59(H)  ALT 0 - 44 U/L 32 36 45(H)    DIAGNOSTIC IMAGING:  I have independently reviewed the relevant imaging and discussed with the patient.   ASSESSMENT & PLAN: 1.  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 .  CBC on 11/18/2020 hemoglobin 10.3 with MCV 92.  Normal white count and platelet count. - History of gastric bypass in 2013, successfully lost 200+ pounds. - She takes B12 and iron pills daily. - Denies any bleeding per rectum or melena. - Received 4 units of PRBC in December 2021 when EGD showed anastomotic ulceration at the gastrojejunostomy site. - Repeat EGD on 06/05/2020 showed normal esophagus with healed anastomotic ulcer and normal jejunum. - Colonoscopy on file on  07/14/2011 with mild diverticulosis in descending and sigmoid colon. - Patient  reportedly underwent colon[facility] at [facility] in 2020 which was normal. 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  Elevated B12 at 2816, methylmalonic acid pending Normal reticulocytes, LDH, SPEP. 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 - PLAN: Recommend IV iron with Feraheme x2. - We discussed that IV iron infusions can include side effects such as back pain, muscle spasms, fatigue, and flulike symptoms as well as other side effects.  We also discussed the rare but serious possibility of allergic reaction.  Patient verbalizes understanding and acceptance of these risks and would like to proceed with IV iron.  Due to the patient's history of multiple medication allergies, we will proceed with premedication protocol. - Recommend copper repletion with OTC copper supplement 2 mg daily. - She has been taking vitamin B12 3000 mcg daily - instructed to decrease dose to 3000 mcg every other day - Repeat labs and RTC in 3 months.  (Phone visit okay if patient prefers)  2.  Social/family history: - She is independent of all ADLs and IADLs.  She worked in [employer].  She also worked at [employer] for few years and exposed to dyes.  No history of smoking. - Mother had breast cancer.  Maternal aunt had cancer.  Maternal cousin has colorectal cancer.   PLAN SUMMARY & DISPOSITION: Feraheme x2 Labs in 3 months RTC after labs  All questions were answered. The patient knows to call the clinic with any problems, questions or concerns.  Medical decision making: Moderate  Time spent on visit: I spent 20 minutes counseling the patient face to face. The total time spent in the appointment was 30 minutes and more than 50% was on counseling.   Carnella Guadalajara, PA-C   04/11/2021 5:06 PM

## 2021-04-11 ENCOUNTER — Encounter (HOSPITAL_COMMUNITY): Payer: Self-pay | Admitting: Physician Assistant

## 2021-04-11 ENCOUNTER — Other Ambulatory Visit: Payer: Self-pay

## 2021-04-11 ENCOUNTER — Inpatient Hospital Stay (HOSPITAL_COMMUNITY): Attending: Hematology | Admitting: Physician Assistant

## 2021-04-11 VITALS — BP 145/80 | HR 82 | Temp 97.2°F | Resp 18 | Wt 162.0 lb

## 2021-04-11 DIAGNOSIS — K912 Postsurgical malabsorption, not elsewhere classified: Secondary | ICD-10-CM

## 2021-04-11 DIAGNOSIS — Z803 Family history of malignant neoplasm of breast: Secondary | ICD-10-CM | POA: Diagnosis not present

## 2021-04-11 DIAGNOSIS — E610 Copper deficiency: Secondary | ICD-10-CM | POA: Insufficient documentation

## 2021-04-11 DIAGNOSIS — D649 Anemia, unspecified: Secondary | ICD-10-CM | POA: Diagnosis present

## 2021-04-11 DIAGNOSIS — Z809 Family history of malignant neoplasm, unspecified: Secondary | ICD-10-CM | POA: Insufficient documentation

## 2021-04-11 DIAGNOSIS — Z9884 Bariatric surgery status: Secondary | ICD-10-CM | POA: Diagnosis not present

## 2021-04-11 DIAGNOSIS — E538 Deficiency of other specified B group vitamins: Secondary | ICD-10-CM

## 2021-04-11 DIAGNOSIS — Z903 Acquired absence of stomach [part of]: Secondary | ICD-10-CM

## 2021-04-11 DIAGNOSIS — Z79899 Other long term (current) drug therapy: Secondary | ICD-10-CM | POA: Insufficient documentation

## 2021-04-11 DIAGNOSIS — R110 Nausea: Secondary | ICD-10-CM | POA: Insufficient documentation

## 2021-04-11 DIAGNOSIS — R5383 Other fatigue: Secondary | ICD-10-CM | POA: Diagnosis not present

## 2021-04-11 DIAGNOSIS — D509 Iron deficiency anemia, unspecified: Secondary | ICD-10-CM | POA: Diagnosis not present

## 2021-04-11 DIAGNOSIS — Z800 Family history of malignant neoplasm of digestive organs: Secondary | ICD-10-CM | POA: Diagnosis not present

## 2021-04-11 DIAGNOSIS — R197 Diarrhea, unspecified: Secondary | ICD-10-CM | POA: Insufficient documentation

## 2021-04-11 HISTORY — DX: Iron deficiency anemia, unspecified: D50.9

## 2021-04-11 LAB — METHYLMALONIC ACID, SERUM: Methylmalonic Acid, Quantitative: 110 nmol/L (ref 0–378)

## 2021-04-11 NOTE — Patient Instructions (Signed)
[facility] ?Discharge Instructions ? ?You were seen today by Rojelio Brenner PA-C for your history of anemia and your vitamin and mineral deficiencies. ? ?The labs that were checked last month showed that you are low on iron and copper. ?- IRON DEFICIENCY: We will give you IV iron (Feraheme) x2 doses.  We will give you premedication to help decrease the risk of allergic reaction.  Please see the attached handouts for further information regarding this medication. ?- COPPER DEFICIENCY: Start taking over-the-counter copper (2 mg) supplement daily. ?- VITAMIN B-12 DEFICIENCY: Your vitamin B12 levels are actually elevated.  Please change your dosing to every other day rather than every day. ? ?LABS: Return in 3 months for repeat labs ? ?FOLLOW-UP APPOINTMENT: Office visit in 3 months, the week after labs ? ? ?Thank you for choosing [facility] at [facility] to provide your oncology and hematology care.  To afford each patient quality time with our provider, please arrive at least 15 minutes before your scheduled appointment time.  ? ?If you have a lab appointment with the Cancer Center please come in thru the Main Entrance and check in at the main information desk. ? ?You need to re-schedule your appointment should you arrive 10 or more minutes late.  We strive to give you quality time with our providers, and arriving late affects you and other patients whose appointments are after yours.  Also, if you no show three or more times for appointments you may be dismissed from the clinic at the providers discretion.     ?Again, thank you for choosing [facility].  Our hope is that these requests will decrease the amount of time that you wait before being seen by our physicians.       ?_____________________________________________________________ ? ?Should you have questions after your visit to [facility], please contact our office at  [phone] and follow the prompts.  Our office hours are 8:00 a.m. and 4:30 p.m. Monday - Friday.  Please note that voicemails left after 4:00 p.m. may not be returned until the following business day.  We are closed weekends and major holidays.  You do have access to a nurse 24-7, just call the main number to the clinic [phone] and do not press any options, hold on the line and a nurse will answer the phone.   ? ?For prescription refill requests, have your pharmacy contact our office and allow 72 hours.   ? ?Due to Covid, you will need to wear a mask upon entering the hospital. If you do not have a mask, a mask will be given to you at the Main Entrance upon arrival. For doctor visits, patients may have 1 support person age 70 or older with them. For treatment visits, patients can not have anyone with them due to social distancing guidelines and our immunocompromised population.  ? ?  ?

## 2021-04-14 ENCOUNTER — Inpatient Hospital Stay (HOSPITAL_COMMUNITY)

## 2021-04-14 ENCOUNTER — Other Ambulatory Visit: Payer: Self-pay

## 2021-04-14 VITALS — BP 128/75 | HR 81 | Temp 98.4°F | Resp 17 | Ht 70.0 in | Wt 162.0 lb

## 2021-04-14 DIAGNOSIS — D649 Anemia, unspecified: Secondary | ICD-10-CM | POA: Diagnosis not present

## 2021-04-14 DIAGNOSIS — D509 Iron deficiency anemia, unspecified: Secondary | ICD-10-CM

## 2021-04-14 MED ORDER — FAMOTIDINE IN NACL 20-0.9 MG/50ML-% IV SOLN
20.0000 mg | Freq: Once | INTRAVENOUS | Status: AC
  Administered 2021-04-14: 20 mg via INTRAVENOUS
  Filled 2021-04-14: qty 50

## 2021-04-14 MED ORDER — SODIUM CHLORIDE 0.9 % IV SOLN
510.0000 mg | Freq: Once | INTRAVENOUS | Status: AC
  Administered 2021-04-14: 510 mg via INTRAVENOUS
  Filled 2021-04-14: qty 17

## 2021-04-14 MED ORDER — METHYLPREDNISOLONE SODIUM SUCC 125 MG IJ SOLR
125.0000 mg | Freq: Once | INTRAMUSCULAR | Status: AC
  Administered 2021-04-14: 125 mg via INTRAVENOUS
  Filled 2021-04-14: qty 2

## 2021-04-14 MED ORDER — SODIUM CHLORIDE 0.9 % IV SOLN: Freq: Once | INTRAVENOUS | Status: AC

## 2021-04-14 MED ORDER — ACETAMINOPHEN 325 MG PO TABS
650.0000 mg | ORAL_TABLET | Freq: Once | ORAL | Status: AC
  Administered 2021-04-14: 650 mg via ORAL
  Filled 2021-04-14: qty 2

## 2021-04-14 NOTE — Patient Instructions (Signed)
[facility]  Discharge Instructions: Thank you for choosing [facility] to provide your oncology and hematology care.  If you have a lab appointment with the Cancer Center, please come in thru the Main Entrance and check in at the main information desk.  Wear comfortable clothing and clothing appropriate for easy access to any Portacath or PICC line.   We strive to give you quality time with your provider. You may need to reschedule your appointment if you arrive late (15 or more minutes).  Arriving late affects you and other patients whose appointments are after yours.  Also, if you miss three or more appointments without notifying the office, you may be dismissed from the clinic at the provider's discretion.      For prescription refill requests, have your pharmacy contact our office and allow 72 hours for refills to be completed.        To help prevent nausea and vomiting after your treatment, we encourage you to take your nausea medication as directed.  BELOW ARE SYMPTOMS THAT SHOULD BE REPORTED IMMEDIATELY: *FEVER GREATER THAN 100.4 F (38 C) OR HIGHER *CHILLS OR SWEATING *NAUSEA AND VOMITING THAT IS NOT CONTROLLED WITH YOUR NAUSEA MEDICATION *UNUSUAL SHORTNESS OF BREATH *UNUSUAL BRUISING OR BLEEDING *URINARY PROBLEMS (pain or burning when urinating, or frequent urination) *BOWEL PROBLEMS (unusual diarrhea, constipation, pain near the anus) TENDERNESS IN MOUTH AND THROAT WITH OR WITHOUT PRESENCE OF ULCERS (sore throat, sores in mouth, or a toothache) UNUSUAL RASH, SWELLING OR PAIN  UNUSUAL VAGINAL DISCHARGE OR ITCHING   Items with * indicate a potential emergency and should be followed up as soon as possible or go to the Emergency Department if any problems should occur.  Please show the CHEMOTHERAPY ALERT CARD or IMMUNOTHERAPY ALERT CARD at check-in to the Emergency Department and triage nurse.  Should you have questions after your visit or need to cancel  or reschedule your appointment, please contact [facility] [phone]  and follow the prompts.  Office hours are 8:00 a.m. to 4:30 p.m. Monday - Friday. Please note that voicemails left after 4:00 p.m. may not be returned until the following business day.  We are closed weekends and major holidays. You have access to a nurse at all times for urgent questions. Please call the main number to the clinic [phone] and follow the prompts.  For any non-urgent questions, you may also contact your provider using MyChart. We now offer e-Visits for anyone 18 and older to request care online for non-urgent symptoms. For details visit [email or web address].   Also download the MyChart app! Go to the app store, search "MyChart", open the app, select [facility], and log in with your MyChart username and password.  Due to Covid, a mask is required upon entering the hospital/clinic. If you do not have a mask, one will be given to you upon arrival. For doctor visits, patients may have 1 support person aged 18 or older with them. For treatment visits, patients cannot have anyone with them due to current Covid guidelines and our immunocompromised population.  

## 2021-04-14 NOTE — Progress Notes (Signed)
Patient presents today for iron infusion.  Patient is in satisfactory condition with no complaints voiced.  Vital signs are stable.  Patient took Benadryl 50 mg prior to visit around 0730.  We will proceed with treatment per MD orders. ? ?Patient tolerated treatment well with no complaints voiced.  Patient left ambulatory in stable condition.  Vital signs stable at discharge.  Follow up as scheduled.    ?

## 2021-04-21 ENCOUNTER — Inpatient Hospital Stay (HOSPITAL_COMMUNITY)

## 2021-04-21 ENCOUNTER — Encounter (HOSPITAL_COMMUNITY): Payer: Self-pay

## 2021-04-21 ENCOUNTER — Other Ambulatory Visit: Payer: Self-pay

## 2021-04-21 VITALS — BP 131/69 | HR 78 | Temp 97.7°F | Resp 18

## 2021-04-21 DIAGNOSIS — D649 Anemia, unspecified: Secondary | ICD-10-CM | POA: Diagnosis not present

## 2021-04-21 DIAGNOSIS — D509 Iron deficiency anemia, unspecified: Secondary | ICD-10-CM

## 2021-04-21 MED ORDER — ACETAMINOPHEN 325 MG PO TABS
650.0000 mg | ORAL_TABLET | Freq: Once | ORAL | Status: AC
  Administered 2021-04-21: 650 mg via ORAL
  Filled 2021-04-21: qty 2

## 2021-04-21 MED ORDER — SODIUM CHLORIDE 0.9 % IV SOLN: Freq: Once | INTRAVENOUS | Status: AC

## 2021-04-21 MED ORDER — METHYLPREDNISOLONE SODIUM SUCC 125 MG IJ SOLR
125.0000 mg | Freq: Once | INTRAMUSCULAR | Status: AC
  Administered 2021-04-21: 125 mg via INTRAVENOUS
  Filled 2021-04-21: qty 2

## 2021-04-21 MED ORDER — FAMOTIDINE IN NACL 20-0.9 MG/50ML-% IV SOLN
20.0000 mg | Freq: Once | INTRAVENOUS | Status: AC
  Administered 2021-04-21: 20 mg via INTRAVENOUS
  Filled 2021-04-21: qty 50

## 2021-04-21 MED ORDER — SODIUM CHLORIDE 0.9 % IV SOLN
510.0000 mg | Freq: Once | INTRAVENOUS | Status: AC
  Administered 2021-04-21: 510 mg via INTRAVENOUS
  Filled 2021-04-21: qty 17

## 2021-04-21 MED ORDER — LORATADINE 10 MG PO TABS
10.0000 mg | ORAL_TABLET | Freq: Once | ORAL | Status: AC
  Administered 2021-04-21: 10 mg via ORAL
  Filled 2021-04-21: qty 1

## 2021-04-21 NOTE — Patient Instructions (Signed)
[facility]  Discharge Instructions: ?Thank you for choosing [facility] to provide your oncology and hematology care.  ?If you have a lab appointment with the Cancer Center, please come in thru the Main Entrance and check in at the main information desk. ? ?Wear comfortable clothing and clothing appropriate for easy access to any Portacath or PICC line.  ? ?We strive to give you quality time with your provider. You may need to reschedule your appointment if you arrive late (15 or more minutes).  Arriving late affects you and other patients whose appointments are after yours.  Also, if you miss three or more appointments without notifying the office, you may be dismissed from the clinic at the provider?s discretion.    ?  ?For prescription refill requests, have your pharmacy contact our office and allow 72 hours for refills to be completed.   ? ?Today you received the following Feraheme, return as scheduled. ?  ?To help prevent nausea and vomiting after your treatment, we encourage you to take your nausea medication as directed. ? ?BELOW ARE SYMPTOMS THAT SHOULD BE REPORTED IMMEDIATELY: ?*FEVER GREATER THAN 100.4 F (38 ?C) OR HIGHER ?*CHILLS OR SWEATING ?*NAUSEA AND VOMITING THAT IS NOT CONTROLLED WITH YOUR NAUSEA MEDICATION ?*UNUSUAL SHORTNESS OF BREATH ?*UNUSUAL BRUISING OR BLEEDING ?*URINARY PROBLEMS (pain or burning when urinating, or frequent urination) ?*BOWEL PROBLEMS (unusual diarrhea, constipation, pain near the anus) ?TENDERNESS IN MOUTH AND THROAT WITH OR WITHOUT PRESENCE OF ULCERS (sore throat, sores in mouth, or a toothache) ?UNUSUAL RASH, SWELLING OR PAIN  ?UNUSUAL VAGINAL DISCHARGE OR ITCHING  ? ?Items with * indicate a potential emergency and should be followed up as soon as possible or go to the Emergency Department if any problems should occur. ? ?Please show the CHEMOTHERAPY ALERT CARD or IMMUNOTHERAPY ALERT CARD at check-in to the Emergency Department and triage  nurse. ? ?Should you have questions after your visit or need to cancel or reschedule your appointment, please contact  CANCER CENTER [phone]  and follow the prompts.  Office hours are 8:00 a.m. to 4:30 p.m. Monday - Friday. Please note that voicemails left after 4:00 p.m. may not be returned until the following business day.  We are closed weekends and major holidays. You have access to a nurse at all times for urgent questions. Please call the main number to the clinic [phone] and follow the prompts. ? ?For any non-urgent questions, you may also contact your provider using MyChart. We now offer e-Visits for anyone 18 and older to request care online for non-urgent symptoms. For details visit [email or web address]. ?  ?Also download the MyChart app! Go to the app store, search "MyChart", open the app, select [facility], and log in with your MyChart username and password. ? ?Due to Covid, a mask is required upon entering the hospital/clinic. If you do not have a mask, one will be given to you upon arrival. For doctor visits, patients may have 1 support person aged 18 or older with them. For treatment visits, patients cannot have anyone with them due to current Covid guidelines and our immunocompromised population.  ?

## 2021-04-21 NOTE — Progress Notes (Signed)
Patient tolerated iron infusion with no complaints voiced.  Peripheral IV site clean and dry with good blood return noted before and after infusion.  Band aid applied.  VSS with discharge and left in satisfactory condition with no s/s of distress noted.   

## 2021-04-25 ENCOUNTER — Emergency Department (HOSPITAL_COMMUNITY)

## 2021-04-25 ENCOUNTER — Other Ambulatory Visit: Payer: Self-pay

## 2021-04-25 ENCOUNTER — Encounter (HOSPITAL_COMMUNITY): Payer: Self-pay | Admitting: Hematology

## 2021-04-25 ENCOUNTER — Emergency Department (HOSPITAL_COMMUNITY)
Admission: EM | Admit: 2021-04-25 | Discharge: 2021-04-25 | Disposition: A | Discharge status: Home / Self Care | Attending: Emergency Medicine | Admitting: Emergency Medicine

## 2021-04-25 ENCOUNTER — Encounter (HOSPITAL_COMMUNITY): Payer: Self-pay | Admitting: *Deleted

## 2021-04-25 DIAGNOSIS — Z20822 Contact with and (suspected) exposure to covid-19: Secondary | ICD-10-CM | POA: Diagnosis not present

## 2021-04-25 DIAGNOSIS — R0789 Other chest pain: Secondary | ICD-10-CM | POA: Insufficient documentation

## 2021-04-25 DIAGNOSIS — I10 Essential (primary) hypertension: Secondary | ICD-10-CM | POA: Diagnosis not present

## 2021-04-25 DIAGNOSIS — Z7982 Long term (current) use of aspirin: Secondary | ICD-10-CM | POA: Insufficient documentation

## 2021-04-25 DIAGNOSIS — Z91040 Latex allergy status: Secondary | ICD-10-CM | POA: Diagnosis not present

## 2021-04-25 DIAGNOSIS — Z79899 Other long term (current) drug therapy: Secondary | ICD-10-CM | POA: Diagnosis not present

## 2021-04-25 DIAGNOSIS — R0602 Shortness of breath: Secondary | ICD-10-CM | POA: Diagnosis not present

## 2021-04-25 DIAGNOSIS — R000 Tachycardia, unspecified: Secondary | ICD-10-CM | POA: Insufficient documentation

## 2021-04-25 DIAGNOSIS — R600 Localized edema: Secondary | ICD-10-CM | POA: Insufficient documentation

## 2021-04-25 DIAGNOSIS — E039 Hypothyroidism, unspecified: Secondary | ICD-10-CM | POA: Diagnosis not present

## 2021-04-25 DIAGNOSIS — R7309 Other abnormal glucose: Secondary | ICD-10-CM | POA: Insufficient documentation

## 2021-04-25 LAB — CBC
HCT: 45.9 % (ref 36.0–46.0)
Hemoglobin: 14.6 g/dL (ref 12.0–15.0)
MCH: 29.6 pg (ref 26.0–34.0)
MCHC: 31.8 g/dL (ref 30.0–36.0)
MCV: 92.9 fL (ref 80.0–100.0)
Platelets: 200 10*3/uL (ref 150–400)
RBC: 4.94 MIL/uL (ref 3.87–5.11)
RDW: 13.1 % (ref 11.5–15.5)
WBC: 10.2 10*3/uL (ref 4.0–10.5)
nRBC: 0 % (ref 0.0–0.2)

## 2021-04-25 LAB — BASIC METABOLIC PANEL
Anion gap: 10 (ref 5–15)
BUN: 14 mg/dL (ref 8–23)
CO2: 25 mmol/L (ref 22–32)
Calcium: 9.1 mg/dL (ref 8.9–10.3)
Chloride: 103 mmol/L (ref 98–111)
Creatinine, Ser: 0.77 mg/dL (ref 0.44–1.00)
GFR, Estimated: >60 mL/min (ref >60)
Glucose, Bld: 148 mg/dL — ABNORMAL HIGH (ref 70–99)
Potassium: 3.6 mmol/L (ref 3.5–5.1)
Sodium: 138 mmol/L (ref 135–145)

## 2021-04-25 LAB — RESP PANEL BY RT-PCR (FLU A&B, COVID) ARPGX2
Influenza A by PCR: NEGATIVE
Influenza B by PCR: NEGATIVE
SARS Coronavirus 2 by RT PCR: NEGATIVE

## 2021-04-25 LAB — TROPONIN I (HIGH SENSITIVITY)
Troponin I (High Sensitivity): 2 ng/L (ref <18)
Troponin I (High Sensitivity): 3 ng/L (ref <18)

## 2021-04-25 MED ORDER — MORPHINE SULFATE (PF) 4 MG/ML IV SOLN
4.0000 mg | Freq: Once | INTRAVENOUS | Status: AC
  Administered 2021-04-25: 4 mg via INTRAVENOUS
  Filled 2021-04-25: qty 1

## 2021-04-25 MED ORDER — ONDANSETRON HCL 4 MG/2ML IJ SOLN
4.0000 mg | Freq: Once | INTRAMUSCULAR | Status: AC
  Administered 2021-04-25: 4 mg via INTRAVENOUS
  Filled 2021-04-25: qty 2

## 2021-04-25 MED ORDER — SODIUM CHLORIDE 0.9 % IV BOLUS
1000.0000 mL | Freq: Once | INTRAVENOUS | Status: AC
  Administered 2021-04-25: 1000 mL via INTRAVENOUS

## 2021-04-25 MED ORDER — IOHEXOL 350 MG/ML SOLN
75.0000 mL | Freq: Once | INTRAVENOUS | Status: AC | PRN
  Administered 2021-04-25: 75 mL via INTRAVENOUS

## 2021-04-25 NOTE — ED Triage Notes (Signed)
Pt c/o chest pain that started yesterday.  ?

## 2021-04-25 NOTE — ED Provider Notes (Signed)
?[facility] ?Provider Note ? ? ?CSN: [id number] ?Arrival date & time: 04/25/21  1520 ? ?  ? ?History ? ?Chief Complaint  ?Patient presents with  ? Chest Pain  ? ? ?[name] is a 61 y.o. female with a history including hypothyroidism, hypertension, anxiety, IBS, presenting with sudden onset constant pressure sensation right greater than left chest in association with shortness of breath.  Her symptoms are constant at rest, worse with exertion and pleuritic in character.  She has had no nausea or vomiting, denies abdominal pain, denies palpitations, dizziness, diaphoresis.  She has had no dizziness, fevers or chills, denies cough, no orthopnea, reports chronic mild bilateral ankle edema which has not worsened today.  She does express significant personal home stressors describing an adult son who lives in her home and causes her great anxiety.  This has been worsened over the past several days.  She takes Xanax nightly.  She has found no alleviators for her symptoms.  No history of cardiac disease, ? ?The history is provided by the patient.  ? ?HPI: A 61 year old patient with a history of hypertension presents for evaluation of chest pain. Initial onset of pain was more than 6 hours ago. The patient's chest pain is described as heaviness/pressure/tightness and is not worse with exertion. The patient's chest pain is not middle- or left-sided, is not well-localized, is not sharp and does not radiate to the arms/jaw/neck. The patient does not complain of nausea and denies diaphoresis. The patient has no history of stroke, has no history of peripheral artery disease, has not smoked in the past 90 days, denies any history of treated diabetes, has no relevant family history of coronary artery disease (first degree relative at less than age 36), has no history of hypercholesterolemia and does not have an elevated BMI (>=30).  ? ?Home Medications ?Prior to Admission medications   ?Medication Sig Start  Date End Date Taking? Authorizing Provider  ?ALPRAZolam (XANAX) 0.5 MG tablet Take 0.5 mg by mouth at bedtime.   Yes [provider]  ?aspirin EC 81 MG tablet Take 162 mg by mouth daily. Swallow whole.   Yes [provider]  ?Biotin 5 MG TABS Take 5 mg by mouth daily.   Yes [provider]  ?bismuth subsalicylate (PEPTO BISMOL) 262 MG/15ML suspension Take 30 mLs by mouth every 6 (six) hours as needed for diarrhea or loose stools or indigestion.   Yes [provider]  ?diphenhydrAMINE (BENADRYL) 25 mg capsule Take 50 mg by mouth in the morning and at bedtime.   Yes [provider]  ?levothyroxine (SYNTHROID, LEVOTHROID) 125 MCG tablet Take 125 mcg by mouth daily before breakfast.  06/11/15  Yes [provider]  ?pantoprazole (PROTONIX) 40 MG tablet Take 1 tablet (40 mg total) by mouth daily before breakfast. 06/05/20  Yes Rehman, Mechele Dawley, MD  ?Pediatric Multivitamins-Iron (FLINTSTONES COMPLETE) 18 MG CHEW Chew 1 tablet by mouth in the morning and at bedtime. ?Patient taking differently: Chew 2 tablets by mouth daily. 01/25/20  Yes Rehman, Mechele Dawley, MD  ?promethazine (PHENERGAN) 25 MG tablet Take 25 mg by mouth every 6 (six) hours as needed for vomiting or nausea.   Yes [provider]  ?traMADol (ULTRAM) 50 MG tablet Take 50 mg by mouth every 4 (four) hours as needed for pain. 01/09/20  Yes [provider]  ?vitamin B-12 1000 MCG tablet Take 1 tablet (1,000 mcg total) by mouth daily. ?Patient taking differently: Take 3,000 mcg by  mouth daily. 01/13/20  Yes Johnson, Clanford L, MD  ?vitamin C (ASCORBIC ACID) 500 MG tablet Take 500 mg by mouth daily.   Yes [provider]  ?   ? ?Allergies    ?Bee pollen, Bee venom, Hydrocodone-acetaminophen, Chocolate, Nsaids, Synthroid [levothyroxine sodium], Latex, and Neosporin [neomycin-bacitracin zn-polymyx]   ? ?Review of Systems   ?Review of Systems  ?Constitutional:  Negative for chills and fever.   ?HENT:  Negative for congestion.   ?Eyes: Negative.   ?Respiratory:  Positive for shortness of breath. Negative for chest tightness.   ?Cardiovascular:  Positive for chest pain.  ?Gastrointestinal:  Negative for abdominal pain, nausea and vomiting.  ?Genitourinary: Negative.   ?Musculoskeletal:  Negative for arthralgias, joint swelling and neck pain.  ?Skin: Negative.  Negative for rash and wound.  ?Neurological:  Negative for dizziness, weakness, light-headedness, numbness and headaches.  ?Psychiatric/Behavioral: Negative.    ?All other systems reviewed and are negative. ? ?Physical Exam ?Updated Vital Signs ?BP 138/71   Pulse 100   Temp 99.6 ?F (37.6 ?C) (Oral)   Resp (!) 22   Ht 5\' 10"  (1.778 m)   Wt 72.6 kg   LMP 08/14/2011   SpO2 97%   BMI 22.96 kg/m?  ?Physical Exam ?Vitals and nursing note reviewed.  ?Constitutional:   ?   Appearance: She is well-developed.  ?HENT:  ?   Head: Normocephalic and atraumatic.  ?Eyes:  ?   Conjunctiva/sclera: Conjunctivae normal.  ?Cardiovascular:  ?   Rate and Rhythm: Normal rate and regular rhythm.  ?   Heart sounds: Normal heart sounds.  ?Pulmonary:  ?   Effort: Pulmonary effort is normal.  ?   Breath sounds: Normal breath sounds. No decreased breath sounds, wheezing, rhonchi or rales.  ?Abdominal:  ?   General: Bowel sounds are normal.  ?   Palpations: Abdomen is soft.  ?   Tenderness: There is no abdominal tenderness.  ?Musculoskeletal:     ?   General: Normal range of motion.  ?   Cervical back: Normal range of motion.  ?   Right lower leg: Edema present.  ?   Left lower leg: Edema present.  ?   Comments: Trace bilateral ankle edema, no calf tenderness.  ?Skin: ?   General: Skin is warm and dry.  ?   Capillary Refill: Capillary refill takes less than 2 seconds.  ?Neurological:  ?   General: No focal deficit present.  ?   Mental Status: She is alert.  ? ? ?ED Results / Procedures / Treatments   ?Labs ?(all labs ordered are listed, but only abnormal results are  displayed) ?Labs Reviewed  ?BASIC METABOLIC PANEL - Abnormal; Notable for the following components:  ?    Result Value  ? Glucose, Bld 148 (*)   ? All other components within normal limits  ?RESP PANEL BY RT-PCR (FLU A&B, COVID) ARPGX2  ?CBC  ?TROPONIN I (HIGH SENSITIVITY)  ?TROPONIN I (HIGH SENSITIVITY)  ? ? ?EKG ?EKG Interpretation ? ?Date/Time:  Friday April 25 2021 15:34:27 EDT ?Ventricular Rate:  122 ?PR Interval:  159 ?QRS Duration: 86 ?QT Interval:  312 ?QTC Calculation: 445 ?R Axis:   30 ?Text Interpretation: Sinus tachycardia Atrial premature complex Consider anterior infarct Confirmed by Milton Ferguson [phone]) on 04/25/2021 7:37:54 PM ? ?Radiology ?DG Chest 2 View ? ?Result Date: 04/25/2021 ?CLINICAL DATA:  Chest pain and pressure EXAM: CHEST - 2 VIEW COMPARISON:  01/10/2020 FINDINGS: Midline trachea. Normal heart size. No pleural effusion or  pneumothorax. Lung volumes are low. Left hemidiaphragm elevation is moderate, increased. Right infrahilar and left base airspace disease, favoring atelectasis. Right upper lobe scarring is felt to be similar. IMPRESSION: Diminished lung volumes with bilateral lower lobe pulmonary opacities, favored to represent atelectasis. Electronically Signed   By: Abigail Miyamoto M.D.   On: 04/25/2021 16:12  ? ?CT Angio Chest PE W and/or Wo Contrast ? ?Result Date: 04/25/2021 ?CLINICAL DATA:  Pulmonary embolism (PE) suspected, high prob Chest pain with inspiration. EXAM: CT ANGIOGRAPHY CHEST WITH CONTRAST TECHNIQUE: Multidetector CT imaging of the chest was performed using the standard protocol during bolus administration of intravenous contrast. Multiplanar CT image reconstructions and MIPs were obtained to evaluate the vascular anatomy. RADIATION DOSE REDUCTION: This exam was performed according to the departmental dose-optimization program which includes automated exposure control, adjustment of the mA and/or kV according to patient size and/or use of iterative reconstruction  technique. CONTRAST:  32mL OMNIPAQUE IOHEXOL 350 MG/ML SOLN COMPARISON:  Radiograph earlier today.  No prior chest CT FINDINGS: Cardiovascular: There are no filling defects within the pulmonary arteries to suggest pulmonar

## 2021-04-25 NOTE — Discharge Instructions (Addendum)
As discussed, your lab tests, EKG and exam are reassuring today.  It is possible your symptoms are stress-induced and we are recommending that you increase your Ativan as needed up to 4 times daily.   ? ?Your CT scan suggest a possible old injury of your T10 thoracic vertebrae versus excessive calcium loss at this site which is sometimes seen in a condition called Paget's disease.  This may or may not be present, but you will need further testing to rule out this possibility.  I am printing some information about this condition for you to read, however you will need to see Dr. Phillips Odor to further assess for this condition. ? ?In the interim return here if you have any new or worsening symptoms. ?

## 2021-05-20 ENCOUNTER — Ambulatory Visit (INDEPENDENT_AMBULATORY_CARE_PROVIDER_SITE_OTHER): Admitting: Internal Medicine

## 2021-05-20 ENCOUNTER — Encounter (INDEPENDENT_AMBULATORY_CARE_PROVIDER_SITE_OTHER): Payer: Self-pay | Admitting: Internal Medicine

## 2021-05-20 ENCOUNTER — Encounter (HOSPITAL_COMMUNITY): Payer: Self-pay | Admitting: Hematology

## 2021-05-20 DIAGNOSIS — Z8711 Personal history of peptic ulcer disease: Secondary | ICD-10-CM

## 2021-05-20 DIAGNOSIS — R7989 Other specified abnormal findings of blood chemistry: Secondary | ICD-10-CM | POA: Insufficient documentation

## 2021-05-20 DIAGNOSIS — Z13820 Encounter for screening for osteoporosis: Secondary | ICD-10-CM

## 2021-05-20 NOTE — Progress Notes (Signed)
Presenting complaint; ? ?History of peptic ulcer disease and iron deficiency anemia. ?History of abnormal LFTs. ? ?Database and subjective: ? ?Patient is 61 year old Caucasian female who is here for scheduled visit accompanied by her daughter.  She was last seen in the office on 05/21/2020. ?She has history of complicated peptic ulcer disease/gastrojejunal anastomotic ulcer as well as iron deficiency anemia and B12 deficiency.  Gastrojejunal anastomotic ulcer was diagnosed in December 2021 when she presented with melena and hemoglobin of 6 g.  She received blood transfusion.  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  She did not have follow-up exam after the stent was removed.  Gastric biopsy was negative for H. pylori infection. ?She underwent follow-up EGD on 06/05/2020 documenting healing of ulcer. ?She also has a history of mildly elevated transaminases and alkaline phosphatase.  Hepatitis B surface antigen, hepatitis C virus antibody were negative in December 2021.  Antimitochondrial antibody was also negative.  Ultrasound in December 2021 revealed fatty liver. ? ?Patient states she is doing well.  Her appetite is good.  She usually has 2-1/2 meals a day.  Her third meal is half of her usual meal.  She has occasional nausea relieved with Phenergan.  She feels she may have taken it once in the last 30 days.  She denies dysphagia abdominal pain melena or rectal bleeding.  She remains with intermittent diarrhea which she has had since her bariatric surgery.  She is using Pepto-Bismol which helps.  She has chronic back pain for which she uses tramadol no more than once a week.  She had screening colonoscopy in July 2020 at [facility]. ?She has gained 17 pounds in the last 1 year.  She does not do any scheduled physical activity. ? ?Patient states she has never undergone bone density study. ? ?Current Medications: ?Outpatient Encounter Medications as of 05/20/2021   ?Medication Sig  ? ALPRAZolam (XANAX) 0.5 MG tablet Take 0.5 mg by mouth at bedtime.  ? Biotin 5 MG TABS Take 5 mg by mouth daily.  ? bismuth subsalicylate (PEPTO BISMOL) 262 MG/15ML suspension Take 30 mLs by mouth every 6 (six) hours as needed for diarrhea or loose stools or indigestion.  ? diphenhydrAMINE (BENADRYL) 25 mg capsule Take 50 mg by mouth in the morning and at bedtime.  ? levothyroxine (SYNTHROID, LEVOTHROID) 125 MCG tablet Take 125 mcg by mouth daily before breakfast.   ? pantoprazole (PROTONIX) 40 MG tablet Take 1 tablet (40 mg total) by mouth daily before breakfast.  ? Pediatric Multivitamins-Iron (FLINTSTONES COMPLETE) 18 MG CHEW Chew 1 tablet by mouth in the morning and at bedtime. (Patient taking differently: Chew 2 tablets by mouth daily.)  ? promethazine (PHENERGAN) 25 MG tablet Take 25 mg by mouth every 6 (six) hours as needed for vomiting or nausea.  ? traMADol (ULTRAM) 50 MG tablet Take 50 mg by mouth every 4 (four) hours as needed for pain.  ? vitamin B-12 1000 MCG tablet Take 1 tablet (1,000 mcg total) by mouth daily. (Patient taking differently: Take 3,000 mcg by mouth daily.)  ? vitamin C (ASCORBIC ACID) 500 MG tablet Take 500 mg by mouth daily.  ? [DISCONTINUED] aspirin EC 81 MG tablet Take 162 mg by mouth daily. Swallow whole.  ? ?No facility-administered encounter medications on file as of 05/20/2021.  ? ? ? ?Objective: ?Blood pressure 123/76, pulse 93, temperature 98.7 ?F (37.1 ?C), temperature source Oral, height 5' 10"  (1.778 m), weight 179 lb 6.4 oz (  81.4 kg), last menstrual period 08/14/2011. ?Patient is alert and in no acute distress. ?Conjunctiva is pink. Sclera is nonicteric ?Oropharyngeal mucosa is normal. ?No neck masses or thyromegaly noted. ?Cardiac exam with regular rhythm normal S1 and S2. No murmur or gallop noted. ?Lungs are clear to auscultation. ?Abdomen is symmetrical soft and nontender with organomegaly or masses. ?Trace edema noted around the  ankles. ? ?Labs/studies Results: ? ? ? ?  Latest Ref Rng & Units 04/25/2021  ?  3:45 PM 03/28/2021  ? 10:10 AM 05/21/2020  ?  9:40 AM  ?CBC  ?WBC 4.0 - 10.5 K/uL 10.2   5.0   4.5    ?Hemoglobin 12.0 - 15.0 g/dL 14.6   13.0   12.0    ?Hematocrit 36.0 - 46.0 % 45.9   42.9   40.2    ?Platelets 150 - 400 K/uL 200   194   260    ?  ? ?  Latest Ref Rng & Units 04/25/2021  ?  3:45 PM 01/12/2020  ?  4:36 AM 01/11/2020  ?  5:21 AM  ?CMP  ?Glucose 70 - 99 mg/dL 148   86   83    ?BUN 8 - 23 mg/dL 14   9   14     ?Creatinine 0.44 - 1.00 mg/dL 0.77   0.67   0.69    ?Sodium 135 - 145 mmol/L 138   139   137    ?Potassium 3.5 - 5.1 mmol/L 3.6   3.4   3.8    ?Chloride 98 - 111 mmol/L 103   109   109    ?CO2 22 - 32 mmol/L 25   23   23     ?Calcium 8.9 - 10.3 mg/dL 9.1   8.0   8.1    ?Total Protein 6.5 - 8.1 g/dL  4.8   4.8    ?Total Bilirubin 0.3 - 1.2 mg/dL  0.9   1.0    ?Alkaline Phos 38 - 126 U/L  171   173    ?AST 15 - 41 U/L  30   39    ?ALT 0 - 44 U/L  32   36    ?  ? ?  Latest Ref Rng & Units 01/12/2020  ?  4:36 AM 01/11/2020  ?  5:21 AM 01/10/2020  ? 10:13 AM  ?Hepatic Function  ?Total Protein 6.5 - 8.1 g/dL 4.8   4.8   4.6    ?Albumin 3.5 - 5.0 g/dL 2.1   2.1   2.1    ?AST 15 - 41 U/L 30   39   59    ?ALT 0 - 44 U/L 32   36   45    ?Alk Phosphatase 38 - 126 U/L 171   173   188    ?Total Bilirubin 0.3 - 1.2 mg/dL 0.9   1.0   0.2    ?  ?Assessment: ? ?#1.  History of anastomotic gastroduodenal ulcer.  Also was diagnosed in December 2021 and healing documented in April 2022.  She is now maintained on single PPI dose for prophylaxis. ? ?#2.  Abnormal LFTs.  She had mildly elevated transaminases and alkaline phosphatase back in December 2021.  Hepatitis B surface antigen, HCV antibody and AMA were negative.  Transaminases have returned to normal and alkaline phosphatase remained stable. ?If alkaline phosphatase remains elevated will consider repeating antimitochondrial antibody and ultrasound next year. ? ?#3.  History of iron deficiency  anemia  secondary to GI blood loss and possibly impaired iron absorption.  H&H remains normal.  He had 3 Hemoccults 2 months ago and these were negative. ? ?#4.  Patient is average risk for CRC.  Last colonoscopy reportedly was normal in July 2020.  Next one due in July 2030. ? ?#5.  Patient is high risk for bone loss.  Will screen with bone density study. ? ? ?Plan: ? ?Patient advised to start physical activity i.e. walking for 30 minutes at least 3-4 times a week. ?Continue pantoprazole at 40 mg p.o. every morning. ?Continue Flintstone chewable with iron by mouth twice daily. ?Schedule bone density study. ?Office visit in 6 months ? ? ? ? ?

## 2021-05-20 NOTE — Patient Instructions (Addendum)
Consider regular physical activity i.e. walking at least 3-4 times a week and 30 minutes each time. ?Physician will call with results of bone density study. ? ? ?

## 2021-05-28 ENCOUNTER — Other Ambulatory Visit (HOSPITAL_COMMUNITY)

## 2021-06-09 ENCOUNTER — Ambulatory Visit (HOSPITAL_COMMUNITY)
Admission: RE | Admit: 2021-06-09 | Discharge: 2021-06-09 | Disposition: A | Discharge status: Home / Self Care | Source: Ambulatory Visit | Attending: Internal Medicine | Admitting: Internal Medicine

## 2021-06-09 DIAGNOSIS — Z13820 Encounter for screening for osteoporosis: Secondary | ICD-10-CM | POA: Diagnosis present

## 2021-06-11 ENCOUNTER — Telehealth (INDEPENDENT_AMBULATORY_CARE_PROVIDER_SITE_OTHER): Payer: Self-pay | Admitting: *Deleted

## 2021-06-11 NOTE — Telephone Encounter (Signed)
Copied Dr. Patty Sermons result note below: ? ?Patient called 3 times today.  No answer.  Unable to leave message on answering service. ?We do not have her daughter's phone number. ?Please call patient with results. 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 ? ?I also tried to call patient today and no answer.  ?

## 2021-06-12 NOTE — Telephone Encounter (Signed)
Left message to return call 

## 2021-06-13 NOTE — Telephone Encounter (Signed)
Ann - please send copy of bone density report to Dr. Hilma Favors.  ? 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 Patient verbalized understanding.  ?

## 2021-06-13 NOTE — Telephone Encounter (Signed)
report faxed to PCP  

## 2021-07-11 ENCOUNTER — Inpatient Hospital Stay (HOSPITAL_COMMUNITY): Attending: Hematology

## 2021-07-11 DIAGNOSIS — D649 Anemia, unspecified: Secondary | ICD-10-CM | POA: Diagnosis present

## 2021-07-11 DIAGNOSIS — Z9884 Bariatric surgery status: Secondary | ICD-10-CM | POA: Insufficient documentation

## 2021-07-11 DIAGNOSIS — Z90721 Acquired absence of ovaries, unilateral: Secondary | ICD-10-CM | POA: Diagnosis not present

## 2021-07-11 DIAGNOSIS — Z91030 Bee allergy status: Secondary | ICD-10-CM | POA: Insufficient documentation

## 2021-07-11 DIAGNOSIS — Z885 Allergy status to narcotic agent status: Secondary | ICD-10-CM | POA: Diagnosis not present

## 2021-07-11 DIAGNOSIS — Z90710 Acquired absence of both cervix and uterus: Secondary | ICD-10-CM | POA: Diagnosis not present

## 2021-07-11 DIAGNOSIS — Z79899 Other long term (current) drug therapy: Secondary | ICD-10-CM | POA: Insufficient documentation

## 2021-07-11 DIAGNOSIS — R197 Diarrhea, unspecified: Secondary | ICD-10-CM | POA: Insufficient documentation

## 2021-07-11 DIAGNOSIS — E610 Copper deficiency: Secondary | ICD-10-CM

## 2021-07-11 DIAGNOSIS — Z9049 Acquired absence of other specified parts of digestive tract: Secondary | ICD-10-CM | POA: Insufficient documentation

## 2021-07-11 DIAGNOSIS — Z886 Allergy status to analgesic agent status: Secondary | ICD-10-CM | POA: Diagnosis not present

## 2021-07-11 DIAGNOSIS — E538 Deficiency of other specified B group vitamins: Secondary | ICD-10-CM

## 2021-07-11 DIAGNOSIS — D509 Iron deficiency anemia, unspecified: Secondary | ICD-10-CM

## 2021-07-11 DIAGNOSIS — F5089 Other specified eating disorder: Secondary | ICD-10-CM | POA: Diagnosis not present

## 2021-07-11 DIAGNOSIS — R110 Nausea: Secondary | ICD-10-CM | POA: Diagnosis not present

## 2021-07-11 DIAGNOSIS — K912 Postsurgical malabsorption, not elsewhere classified: Secondary | ICD-10-CM

## 2021-07-11 LAB — CBC WITH DIFFERENTIAL/PLATELET
Abs Immature Granulocytes: 0 10*3/uL (ref 0.00–0.07)
Basophils Absolute: 0 10*3/uL (ref 0.0–0.1)
Basophils Relative: 1 %
Eosinophils Absolute: 0.1 10*3/uL (ref 0.0–0.5)
Eosinophils Relative: 2 %
HCT: 43.7 % (ref 36.0–46.0)
Hemoglobin: 13.7 g/dL (ref 12.0–15.0)
Immature Granulocytes: 0 %
Lymphocytes Relative: 38 %
Lymphs Abs: 1.7 10*3/uL (ref 0.7–4.0)
MCH: 29.3 pg (ref 26.0–34.0)
MCHC: 31.4 g/dL (ref 30.0–36.0)
MCV: 93.6 fL (ref 80.0–100.0)
Monocytes Absolute: 0.4 10*3/uL (ref 0.1–1.0)
Monocytes Relative: 9 %
Neutro Abs: 2.2 10*3/uL (ref 1.7–7.7)
Neutrophils Relative %: 50 %
Platelets: 174 10*3/uL (ref 150–400)
RBC: 4.67 MIL/uL (ref 3.87–5.11)
RDW: 12.6 % (ref 11.5–15.5)
WBC: 4.4 10*3/uL (ref 4.0–10.5)
nRBC: 0 % (ref 0.0–0.2)

## 2021-07-11 LAB — IRON AND TIBC
Iron: 79 ug/dL (ref 28–170)
Saturation Ratios: 25 % (ref 10.4–31.8)
TIBC: 314 ug/dL (ref 250–450)
UIBC: 235 ug/dL

## 2021-07-11 LAB — FERRITIN: Ferritin: 483 ng/mL — ABNORMAL HIGH (ref 11–307)

## 2021-07-11 LAB — VITAMIN B12: Vitamin B-12: 1944 pg/mL — ABNORMAL HIGH (ref 180–914)

## 2021-07-14 LAB — METHYLMALONIC ACID, SERUM: Methylmalonic Acid, Quantitative: 76 nmol/L (ref 0–378)

## 2021-07-15 LAB — COPPER, SERUM: Copper: 67 ug/dL — ABNORMAL LOW (ref 80–158)

## 2021-07-17 NOTE — Progress Notes (Signed)
[facility] [address], [place] [address]   CLINIC:  Medical Oncology/Hematology  PCP:  Assunta Found, MD [address] [place] [address] [phone]   REASON FOR VISIT:  Follow-up for normocytic anemia  PRIOR THERAPY: PRBC transfusion x4 (December 2021)  CURRENT THERAPY: IV iron, oral copper supplement  INTERVAL HISTORY:  Ms. Ortlip 61 y.o. female returns for routine follow-up of her normocytic anemia.  She was last seen by Elizebeth Koller PA-C on 04/11/2021.  At today's visit, she reports feeling well.  No recent hospitalizations, surgeries, or changes in baseline health status.  She feels much better after her IV iron (Feraheme on 04/14/2021 and 04/21/2021).  She has improved energy and even felt well enough to start a new job for the first time in 2 years.  She reports that her energy level is almost back to normal.  She has decreased her vitamin B12 (3000 mcg) every other day dosing.  She reports that she forgot to start taking copper supplement.  She denies any bright of blood per rectum, melena, or epistaxis.  Her ice pica has improved.  She denies any restless legs, headaches, chest pain, dyspnea on exertion, lightheadedness, or syncope.  She has 90% energy and 100% appetite. She endorses that she is maintaining a stable weight.   REVIEW OF SYSTEMS:    Review of Systems  Constitutional:  Negative for appetite change, chills, diaphoresis, fatigue, fever and unexpected weight change.  HENT:   Negative for lump/mass and nosebleeds.   Eyes:  Negative for eye problems.  Respiratory:  Negative for cough, hemoptysis and shortness of breath.   Cardiovascular:  Negative for chest pain, leg swelling and palpitations.  Gastrointestinal:  Positive for diarrhea and nausea. Negative for abdominal pain, blood in stool, constipation and vomiting.  Genitourinary:  Negative for hematuria.   Skin: Negative.   Neurological:  Negative for dizziness,  headaches and light-headedness.  Hematological:  Does not bruise/bleed easily.  Psychiatric/Behavioral:  Positive for sleep disturbance.       PAST MEDICAL/SURGICAL HISTORY:  Past Medical History:  Diagnosis Date   Depression    started Lexapro when husband passed away   Hypertension    Hypothyroidism    IBS (irritable bowel syndrome)    Iron deficiency anemia 04/11/2021   PONV (postoperative nausea and vomiting)    Past Surgical History:  Procedure Laterality Date   ABDOMINAL HYSTERECTOMY  08/11/2011   Procedure: HYSTERECTOMY ABDOMINAL;  Surgeon: [name], MD;  Location: [facility];  Service: Gynecology;  Laterality: N/A;   BIOPSY  01/11/2020   Procedure: BIOPSY;  Surgeon: Corbin Ade, MD;  Location: [facility];  Service: Endoscopy;;   CESAREAN SECTION     x 2   CHOLECYSTECTOMY  2009   [facility]   ESOPHAGOGASTRODUODENOSCOPY  Jan 2004   RMR: normal esophagus, small hiatal hernia, normal D1, D2   ESOPHAGOGASTRODUODENOSCOPY (EGD) WITH PROPOFOL N/A 01/11/2020   Procedure: ESOPHAGOGASTRODUODENOSCOPY (EGD) WITH PROPOFOL;  Surgeon: Corbin Ade, MD;  [facility];  Service: Endoscopy;  Laterality: N/A;   ESOPHAGOGASTRODUODENOSCOPY (EGD) WITH PROPOFOL N/A 06/05/2020   Procedure: ESOPHAGOGASTRODUODENOSCOPY (EGD) WITH PROPOFOL;  Surgeon: Malissa Hippo, MD;  [facility];  Service: Endoscopy;  Laterality: N/A;  am   FOOT SURGERY     x 5 right   GASTRIC BYPASS     gastric stent  2020   HAND SURGERY     x2 right   HEMATOMA EVACUATION  08/14/2011   Procedure: EVACUATION HEMATOMA;  Surgeon: Jonnie Kind, MD;  Location: [facility];  Service: Gynecology;  Laterality: N/A;   SALPINGOOPHORECTOMY  08/11/2011   Procedure: SALPINGO OOPHERECTOMY;  Surgeon: Jonnie Kind, MD;  Location: [facility];  Service: Gynecology;  Laterality: Bilateral;   SCAR REVISION  08/11/2011   Procedure: SCAR REVISION;  Surgeon: Jonnie Kind, MD;  Location: [facility];  Service: Gynecology;   Laterality: N/A;  Excision of Wide Cicatrix     SOCIAL HISTORY:  Social History   Socioeconomic History   Marital status: Widowed    Spouse name: Not on file   Number of children: Not on file   Years of education: Not on file   Highest education level: Not on file  Occupational History   Not on file  Tobacco Use   Smoking status: Never    Passive exposure: Never   Smokeless tobacco: Never  Vaping Use   Vaping Use: Never used  Substance and Sexual Activity   Alcohol use: No   Drug use: No   Sexual activity: Never  Other Topics Concern   Not on file  Social History Narrative   Husband passed away 8 years ago from [employer].    Social Determinants of Health   Financial Resource Strain: Not on file  Food Insecurity: Not on file  Transportation Needs: Not on file  Physical Activity: Not on file  Stress: Not on file  Social Connections: Not on file  Intimate Partner Violence: Not on file    FAMILY HISTORY:  Family History  Problem Relation Age of Onset   Colon cancer Neg Hx     CURRENT MEDICATIONS:  Outpatient Encounter Medications as of 07/18/2021  Medication Sig   ALPRAZolam (XANAX) 0.5 MG tablet Take 0.5 mg by mouth at bedtime.   Biotin 5 MG TABS Take 5 mg by mouth daily.   bismuth subsalicylate (PEPTO BISMOL) 262 MG/15ML suspension Take 30 mLs by mouth every 6 (six) hours as needed for diarrhea or loose stools or indigestion.   diphenhydrAMINE (BENADRYL) 25 mg capsule Take 50 mg by mouth in the morning and at bedtime.   levothyroxine (SYNTHROID, LEVOTHROID) 125 MCG tablet Take 125 mcg by mouth daily before breakfast.    pantoprazole (PROTONIX) 40 MG tablet Take 1 tablet (40 mg total) by mouth daily before breakfast.   Pediatric Multivitamins-Iron (FLINTSTONES COMPLETE) 18 MG CHEW Chew 1 tablet by mouth in the morning and at bedtime. (Patient taking differently: Chew 2 tablets by mouth daily.)   promethazine (PHENERGAN) 25 MG tablet Take 25 mg by mouth every 6  (six) hours as needed for vomiting or nausea.   traMADol (ULTRAM) 50 MG tablet Take 50 mg by mouth every 4 (four) hours as needed for pain.   vitamin B-12 1000 MCG tablet Take 1 tablet (1,000 mcg total) by mouth daily. (Patient taking differently: Take 3,000 mcg by mouth daily.)   vitamin C (ASCORBIC ACID) 500 MG tablet Take 500 mg by mouth daily.   No facility-administered encounter medications on file as of 07/18/2021.    ALLERGIES:  Allergies  Allergen Reactions   Bee Pollen Anaphylaxis   Bee Venom Anaphylaxis and Itching    Swelling and itching   Hydrocodone-Acetaminophen Itching   Chocolate Other (See Comments)    Reaction: causes facial blistering,inside mouth   Nsaids Nausea And Vomiting   Synthroid [Levothyroxine Sodium] Itching and Swelling    Patient has a reaction to brand name synthroid only  Latex Rash   Neosporin [Neomycin-Bacitracin Zn-Polymyx] Rash     PHYSICAL EXAM:  ECOG PERFORMANCE STATUS: 0 - Asymptomatic    There were no vitals filed for this visit. There were no vitals filed for this visit. Physical Exam Constitutional:      Appearance: Normal appearance.  HENT:     Head: Normocephalic and atraumatic.     Mouth/Throat:     Mouth: Mucous membranes are moist.  Eyes:     Extraocular Movements: Extraocular movements intact.     Pupils: Pupils are equal, round, and reactive to light.  Cardiovascular:     Rate and Rhythm: Normal rate and regular rhythm.     Pulses: Normal pulses.     Heart sounds: Normal heart sounds.  Pulmonary:     Effort: Pulmonary effort is normal.     Breath sounds: Normal breath sounds.  Abdominal:     General: Bowel sounds are normal.     Palpations: Abdomen is soft.     Tenderness: There is no abdominal tenderness.  Musculoskeletal:        General: No swelling.     Right lower leg: No edema.     Left lower leg: No edema.  Lymphadenopathy:     Cervical: No cervical adenopathy.  Skin:    General: Skin is warm and dry.   Neurological:     General: No focal deficit present.     Mental Status: She is alert and oriented to person, place, and time.  Psychiatric:        Mood and Affect: Mood normal.        Behavior: Behavior normal.      LABORATORY DATA:  I have reviewed the labs as listed.  CBC    Component Value Date/Time   WBC 4.4 07/11/2021 0912   RBC 4.67 07/11/2021 0912   HGB 13.7 07/11/2021 0912   HCT 43.7 07/11/2021 0912   PLT 174 07/11/2021 0912   MCV 93.6 07/11/2021 0912   MCH 29.3 07/11/2021 0912   MCHC 31.4 07/11/2021 0912   RDW 12.6 07/11/2021 0912   LYMPHSABS 1.7 07/11/2021 0912   MONOABS 0.4 07/11/2021 0912   EOSABS 0.1 07/11/2021 0912   BASOSABS 0.0 07/11/2021 0912      Latest Ref Rng & Units 04/25/2021    3:45 PM 01/12/2020    4:36 AM 01/11/2020    5:21 AM  CMP  Glucose 70 - 99 mg/dL 148  86  83   BUN 8 - 23 mg/dL 14  9  14    Creatinine 0.44 - 1.00 mg/dL 0.77  0.67  0.69   Sodium 135 - 145 mmol/L 138  139  137   Potassium 3.5 - 5.1 mmol/L 3.6  3.4  3.8   Chloride 98 - 111 mmol/L 103  109  109   CO2 22 - 32 mmol/L 25  23  23    Calcium 8.9 - 10.3 mg/dL 9.1  8.0  8.1   Total Protein 6.5 - 8.1 g/dL  4.8  4.8   Total Bilirubin 0.3 - 1.2 mg/dL  0.9  1.0   Alkaline Phos 38 - 126 U/L  171  173   AST 15 - 41 U/L  30  39   ALT 0 - 44 U/L  32  36     DIAGNOSTIC IMAGING:  I have independently reviewed the relevant imaging and discussed with the patient.   ASSESSMENT & PLAN: 1.  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 Hilma Favors.  CBC on 11/18/2020 hemoglobin 10.3 with MCV 92.  Normal white count and platelet count. - History of gastric bypass in 2013, successfully lost 200+ pounds. - Received 4 units of PRBC in December 2021 when EGD showed anastomotic ulceration at the gastrojejunostomy site. - Repeat EGD on 06/05/2020 showed normal esophagus with healed anastomotic ulcer and normal  jejunum. - Colonoscopy on file on 07/14/2011 with mild diverticulosis in descending and sigmoid colon. - Patient reportedly underwent colonoscopy at [facility] in 2020 which was normal. 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  Elevated B12 at 2816, methylmalonic acid pending Normal reticulocytes, LDH, SPEP. Hemoccult stool x3 NEGATIVE - She takes iron pills daily.  She takes B12 3000 mcg every other day  - She forgot to start taking copper supplement - IV Feraheme x2 on 04/14/2021 and 04/21/2021 (with PREMEDICATION due to history of multiple medication allergies) - Denies any bleeding per rectum or melena.   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 - Most recent labs (07/11/2021): Hgb 13.7/MCV 93.6, ferritin 483, iron saturation 25%.  Vitamin B12 1944 with methylmalonic acid 76.  Copper remains mildly low at 67. - PLAN: No indication for IV iron at this time. -Reminded patient to start taking OTC copper supplement 2 mg twice daily (or 5 mg daily). - She has been taking vitamin B12 3000 mcg every other day- instructed to decrease dose to 3000 mcg twice weekly  - Repeat labs and RTC in 3 months.  (Phone visit okay if patient prefers)    2.  Social/family history: - She is independent of all ADLs and IADLs.  She worked in [employer].  She also worked at [employer] for few years and exposed to dyes.  No history of smoking. - Mother had breast cancer.  Maternal aunt had cancer.  Maternal cousin has colorectal cancer.   PLAN SUMMARY & DISPOSITION: Labs in 3 months Phone visit 1 week after labs  All questions were answered. The patient knows to call the clinic with any problems, questions or concerns.  Medical decision making: Moderate    Time spent on visit: I spent 20 minutes counseling the  patient face to face. The total time spent in the appointment was 30 minutes and more than 50% was on counseling.   Harriett Rush, PA-C  07/18/2021 8:46 AM

## 2021-07-18 ENCOUNTER — Inpatient Hospital Stay (HOSPITAL_BASED_OUTPATIENT_CLINIC_OR_DEPARTMENT_OTHER): Admitting: Physician Assistant

## 2021-07-18 VITALS — BP 139/73 | HR 79 | Temp 97.2°F | Resp 18 | Ht 70.0 in | Wt 185.8 lb

## 2021-07-18 DIAGNOSIS — E610 Copper deficiency: Secondary | ICD-10-CM | POA: Diagnosis not present

## 2021-07-18 DIAGNOSIS — K912 Postsurgical malabsorption, not elsewhere classified: Secondary | ICD-10-CM | POA: Diagnosis not present

## 2021-07-18 DIAGNOSIS — Z903 Acquired absence of stomach [part of]: Secondary | ICD-10-CM

## 2021-07-18 DIAGNOSIS — E538 Deficiency of other specified B group vitamins: Secondary | ICD-10-CM

## 2021-07-18 DIAGNOSIS — D649 Anemia, unspecified: Secondary | ICD-10-CM | POA: Diagnosis not present

## 2021-07-18 DIAGNOSIS — D509 Iron deficiency anemia, unspecified: Secondary | ICD-10-CM | POA: Diagnosis not present

## 2021-07-18 NOTE — Patient Instructions (Signed)
[facility] Discharge Instructions  You were seen today by Rojelio Brenner PA-C for your history of anemia and your vitamin and mineral deficiencies.  - IRON DEFICIENCY: Your iron levels look great!  You do not need any additional IV iron at this time.  - COPPER DEFICIENCY: Start taking over-the-counter copper supplement daily.  Depending on the dose of the supplement you are able to get over-the-counter, you can either take two 2 mg pills, or one 5 milligrams pill.  - VITAMIN B-12 DEFICIENCY: Your vitamin B12 levels are actually elevated.  Please decrease your vitamin B12 and take it only TWICE PER WEEK.  LABS: Return in 3 months for repeat labs  FOLLOW-UP APPOINTMENT: Phone visit in 3 months, the week after labs   Thank you for choosing [facility] at [facility] to provide your oncology and hematology care.  To afford each patient quality time with our provider, please arrive at least 15 minutes before your scheduled appointment time.   If you have a lab appointment with the Cancer Center please come in thru the Main Entrance and check in at the main information desk.  You need to re-schedule your appointment should you arrive 10 or more minutes late.  We strive to give you quality time with our providers, and arriving late affects you and other patients whose appointments are after yours.  Also, if you no show three or more times for appointments you may be dismissed from the clinic at the providers discretion.     Again, thank you for choosing [facility].  Our hope is that these requests will decrease the amount of time that you wait before being seen by our physicians.       _____________________________________________________________  Should you have questions after your visit to [facility], please contact our office at [phone] and follow the prompts.  Our office hours are 8:00 a.m. and  4:30 p.m. Monday - Friday.  Please note that voicemails left after 4:00 p.m. may not be returned until the following business day.  We are closed weekends and major holidays.  You do have access to a nurse 24-7, just call the main number to the clinic [phone] and do not press any options, hold on the line and a nurse will answer the phone.    For prescription refill requests, have your pharmacy contact our office and allow 72 hours.    Due to Covid, you will need to wear a mask upon entering the hospital. If you do not have a mask, a mask will be given to you at the Main Entrance upon arrival. For doctor visits, patients may have 1 support person age 29 or older with them. For treatment visits, patients can not have anyone with them due to social distancing guidelines and our immunocompromised population.

## 2021-10-17 ENCOUNTER — Inpatient Hospital Stay: Attending: Physician Assistant

## 2021-10-24 ENCOUNTER — Telehealth: Admitting: Physician Assistant

## 2021-11-10 ENCOUNTER — Other Ambulatory Visit (INDEPENDENT_AMBULATORY_CARE_PROVIDER_SITE_OTHER): Payer: Self-pay | Admitting: *Deleted

## 2021-11-10 DIAGNOSIS — R7989 Other specified abnormal findings of blood chemistry: Secondary | ICD-10-CM

## 2021-11-20 ENCOUNTER — Ambulatory Visit (INDEPENDENT_AMBULATORY_CARE_PROVIDER_SITE_OTHER): Admitting: Gastroenterology

## 2021-12-30 ENCOUNTER — Inpatient Hospital Stay (HOSPITAL_COMMUNITY)
Admission: EM | Admit: 2021-12-30 | Discharge: 2022-01-02 | DRG: 378 | Disposition: A | Discharge status: Home / Self Care | Attending: Family Medicine | Admitting: Family Medicine

## 2021-12-30 ENCOUNTER — Encounter (HOSPITAL_COMMUNITY): Payer: Self-pay | Admitting: *Deleted

## 2021-12-30 ENCOUNTER — Emergency Department (HOSPITAL_COMMUNITY)

## 2021-12-30 ENCOUNTER — Other Ambulatory Visit: Payer: Self-pay

## 2021-12-30 DIAGNOSIS — E039 Hypothyroidism, unspecified: Secondary | ICD-10-CM | POA: Diagnosis not present

## 2021-12-30 DIAGNOSIS — Z91030 Bee allergy status: Secondary | ICD-10-CM

## 2021-12-30 DIAGNOSIS — Z9884 Bariatric surgery status: Secondary | ICD-10-CM

## 2021-12-30 DIAGNOSIS — D62 Acute posthemorrhagic anemia: Secondary | ICD-10-CM | POA: Diagnosis present

## 2021-12-30 DIAGNOSIS — F32A Depression, unspecified: Secondary | ICD-10-CM | POA: Diagnosis not present

## 2021-12-30 DIAGNOSIS — Z8711 Personal history of peptic ulcer disease: Secondary | ICD-10-CM

## 2021-12-30 DIAGNOSIS — Z91040 Latex allergy status: Secondary | ICD-10-CM

## 2021-12-30 DIAGNOSIS — K589 Irritable bowel syndrome without diarrhea: Secondary | ICD-10-CM | POA: Diagnosis not present

## 2021-12-30 DIAGNOSIS — K921 Melena: Secondary | ICD-10-CM

## 2021-12-30 DIAGNOSIS — Z634 Disappearance and death of family member: Secondary | ICD-10-CM

## 2021-12-30 DIAGNOSIS — Z79890 Hormone replacement therapy (postmenopausal): Secondary | ICD-10-CM

## 2021-12-30 DIAGNOSIS — Z9049 Acquired absence of other specified parts of digestive tract: Secondary | ICD-10-CM | POA: Diagnosis not present

## 2021-12-30 DIAGNOSIS — Z91018 Allergy to other foods: Secondary | ICD-10-CM | POA: Diagnosis not present

## 2021-12-30 DIAGNOSIS — Z90710 Acquired absence of both cervix and uterus: Secondary | ICD-10-CM

## 2021-12-30 DIAGNOSIS — Z885 Allergy status to narcotic agent status: Secondary | ICD-10-CM | POA: Diagnosis not present

## 2021-12-30 DIAGNOSIS — K219 Gastro-esophageal reflux disease without esophagitis: Secondary | ICD-10-CM | POA: Diagnosis not present

## 2021-12-30 DIAGNOSIS — F411 Generalized anxiety disorder: Secondary | ICD-10-CM | POA: Diagnosis not present

## 2021-12-30 DIAGNOSIS — E876 Hypokalemia: Secondary | ICD-10-CM | POA: Diagnosis not present

## 2021-12-30 DIAGNOSIS — Z888 Allergy status to other drugs, medicaments and biological substances status: Secondary | ICD-10-CM

## 2021-12-30 DIAGNOSIS — K922 Gastrointestinal hemorrhage, unspecified: Principal | ICD-10-CM | POA: Diagnosis present

## 2021-12-30 DIAGNOSIS — K289 Gastrojejunal ulcer, unspecified as acute or chronic, without hemorrhage or perforation: Secondary | ICD-10-CM | POA: Diagnosis present

## 2021-12-30 DIAGNOSIS — Z79899 Other long term (current) drug therapy: Secondary | ICD-10-CM

## 2021-12-30 DIAGNOSIS — K284 Chronic or unspecified gastrojejunal ulcer with hemorrhage: Principal | ICD-10-CM

## 2021-12-30 DIAGNOSIS — I10 Essential (primary) hypertension: Secondary | ICD-10-CM | POA: Diagnosis not present

## 2021-12-30 LAB — HEMOGLOBIN AND HEMATOCRIT, BLOOD
HCT: 27.3 % — ABNORMAL LOW (ref 36.0–46.0)
Hemoglobin: 8.6 g/dL — ABNORMAL LOW (ref 12.0–15.0)

## 2021-12-30 LAB — COMPREHENSIVE METABOLIC PANEL
ALT: 16 U/L (ref 0–44)
AST: 16 U/L (ref 15–41)
Albumin: 2.9 g/dL — ABNORMAL LOW (ref 3.5–5.0)
Alkaline Phosphatase: 170 U/L — ABNORMAL HIGH (ref 38–126)
Anion gap: 5 (ref 5–15)
BUN: 22 mg/dL (ref 8–23)
CO2: 24 mmol/L (ref 22–32)
Calcium: 8.8 mg/dL — ABNORMAL LOW (ref 8.9–10.3)
Chloride: 112 mmol/L — ABNORMAL HIGH (ref 98–111)
Creatinine, Ser: 0.79 mg/dL (ref 0.44–1.00)
GFR, Estimated: >60 mL/min (ref >60)
Glucose, Bld: 105 mg/dL — ABNORMAL HIGH (ref 70–99)
Potassium: 3.5 mmol/L (ref 3.5–5.1)
Sodium: 141 mmol/L (ref 135–145)
Total Bilirubin: 0.2 mg/dL — ABNORMAL LOW (ref 0.3–1.2)
Total Protein: 5.9 g/dL — ABNORMAL LOW (ref 6.5–8.1)

## 2021-12-30 LAB — CBC
HCT: 28.8 % — ABNORMAL LOW (ref 36.0–46.0)
Hemoglobin: 9.1 g/dL — ABNORMAL LOW (ref 12.0–15.0)
MCH: 30 pg (ref 26.0–34.0)
MCHC: 31.6 g/dL (ref 30.0–36.0)
MCV: 95 fL (ref 80.0–100.0)
Platelets: 244 10*3/uL (ref 150–400)
RBC: 3.03 MIL/uL — ABNORMAL LOW (ref 3.87–5.11)
RDW: 13.2 % (ref 11.5–15.5)
WBC: 5.5 10*3/uL (ref 4.0–10.5)
nRBC: 0 % (ref 0.0–0.2)

## 2021-12-30 LAB — TYPE AND SCREEN
ABO/RH(D): A POS
Antibody Screen: NEGATIVE

## 2021-12-30 LAB — POC OCCULT BLOOD, ED

## 2021-12-30 LAB — HIV ANTIBODY (ROUTINE TESTING W REFLEX): HIV Screen 4th Generation wRfx: NONREACTIVE

## 2021-12-30 MED ORDER — ACETAMINOPHEN 650 MG RE SUPP: 650.0000 mg | Freq: Four times a day (QID) | RECTAL | Status: DC | PRN

## 2021-12-30 MED ORDER — ALBUTEROL SULFATE (2.5 MG/3ML) 0.083% IN NEBU: 2.5000 mg | INHALATION_SOLUTION | Freq: Four times a day (QID) | RESPIRATORY_TRACT | Status: DC | PRN

## 2021-12-30 MED ORDER — CHLORHEXIDINE GLUCONATE CLOTH 2 % EX PADS
6.0000 | MEDICATED_PAD | Freq: Every day | CUTANEOUS | Status: DC
  Administered 2021-12-31: 6 via TOPICAL

## 2021-12-30 MED ORDER — SODIUM CHLORIDE 0.9 % IV SOLN: INTRAVENOUS | Status: DC

## 2021-12-30 MED ORDER — PANTOPRAZOLE 80MG IVPB - SIMPLE MED
80.0000 mg | Freq: Once | INTRAVENOUS | Status: AC
  Administered 2021-12-30: 80 mg via INTRAVENOUS
  Filled 2021-12-30: qty 100

## 2021-12-30 MED ORDER — PANTOPRAZOLE INFUSION (NEW) - SIMPLE MED
8.0000 mg/h | INTRAVENOUS | Status: AC
  Administered 2021-12-30 – 2022-01-02 (×4): 8 mg/h via INTRAVENOUS
  Filled 2021-12-30 (×5): qty 100
  Filled 2021-12-30 (×3): qty 80

## 2021-12-30 MED ORDER — TRAMADOL HCL 50 MG PO TABS
50.0000 mg | ORAL_TABLET | ORAL | Status: DC | PRN
  Administered 2021-12-31: 50 mg via ORAL
  Filled 2021-12-30: qty 1

## 2021-12-30 MED ORDER — LEVOTHYROXINE SODIUM 25 MCG PO TABS
125.0000 ug | ORAL_TABLET | Freq: Every day | ORAL | Status: DC
  Administered 2021-12-31 – 2022-01-02 (×3): 125 ug via ORAL
  Filled 2021-12-30 (×3): qty 1

## 2021-12-30 MED ORDER — PANTOPRAZOLE SODIUM 40 MG IV SOLR: 40.0000 mg | Freq: Two times a day (BID) | INTRAVENOUS | Status: DC

## 2021-12-30 MED ORDER — ONDANSETRON HCL 4 MG PO TABS: 4.0000 mg | ORAL_TABLET | Freq: Four times a day (QID) | ORAL | Status: DC | PRN

## 2021-12-30 MED ORDER — ACETAMINOPHEN 325 MG PO TABS
650.0000 mg | ORAL_TABLET | Freq: Four times a day (QID) | ORAL | Status: DC | PRN
  Administered 2022-01-01: 650 mg via ORAL
  Filled 2021-12-30: qty 2

## 2021-12-30 MED ORDER — SODIUM CHLORIDE 0.9 % IV BOLUS
1000.0000 mL | Freq: Once | INTRAVENOUS | Status: AC
  Administered 2021-12-30: 1000 mL via INTRAVENOUS

## 2021-12-30 MED ORDER — ALPRAZOLAM 0.5 MG PO TABS
0.5000 mg | ORAL_TABLET | Freq: Every day | ORAL | Status: DC
  Administered 2021-12-30 – 2022-01-01 (×3): 0.5 mg via ORAL
  Filled 2021-12-30 (×3): qty 1

## 2021-12-30 MED ORDER — SODIUM CHLORIDE 0.9% FLUSH
3.0000 mL | Freq: Two times a day (BID) | INTRAVENOUS | Status: DC
  Administered 2021-12-30 – 2022-01-02 (×6): 3 mL via INTRAVENOUS

## 2021-12-30 MED ORDER — ONDANSETRON HCL 4 MG/2ML IJ SOLN
4.0000 mg | Freq: Four times a day (QID) | INTRAMUSCULAR | Status: DC | PRN
  Administered 2022-01-01: 4 mg via INTRAVENOUS
  Filled 2021-12-30: qty 2

## 2021-12-30 NOTE — ED Provider Notes (Signed)
[facility] EMERGENCY DEPARTMENT Provider Note   CSN: [id number] Arrival date & time: 12/30/21  2992     History  Chief Complaint  Patient presents with   Rectal Bleeding    [name] is a 61 y.o. female.  Patient with melanotic stool last evening.  And then had another one today.  No vomiting no nausea no abdominal pain.  Patient is not on blood thinners.  Patient had similar findings back in December 2021 when she was seen by Dr. Melony Overly had an upper endoscopy and had multiple ulcers.  At that time she needed 4 units of blood transfusion.  Patient last saw Dr. Melony Overly in April of this year.  Patient had gastric bypass surgery before.  Patient has a history of hypertension irritable bowel syndrome.  Patient is never used tobacco products.  Patient states she really has anything to eat or drink since 9:00 this morning.  Patient's vital signs here are significant for blood pressure around 426 systolic but is tachycardic 115 sometimes will go up to 140.  But it is a sinus tachycardia on the monitors.       Home Medications Prior to Admission medications   Medication Sig Start Date End Date Taking? Authorizing Provider  ALPRAZolam Duanne Moron) 0.5 MG tablet Take 0.5 mg by mouth at bedtime.   Yes [provider]  Biotin 5 MG TABS Take 5 mg by mouth daily.   Yes [provider]  bismuth subsalicylate (PEPTO BISMOL) 262 MG/15ML suspension Take 30 mLs by mouth every 6 (six) hours as needed for diarrhea or loose stools or indigestion.   Yes [provider]  diphenhydrAMINE (BENADRYL) 25 mg capsule Take 50 mg by mouth in the morning and at bedtime.   Yes [provider]  levothyroxine (SYNTHROID, LEVOTHROID) 125 MCG tablet Take 125 mcg by mouth daily before breakfast.  06/11/15  Yes [provider]  pantoprazole (PROTONIX) 40 MG tablet Take 1 tablet (40 mg total) by mouth daily before breakfast. 06/05/20  Yes Rehman, Mechele Dawley, MD  Pediatric  Multivitamins-Iron (FLINTSTONES COMPLETE) 18 MG CHEW Chew 1 tablet by mouth in the morning and at bedtime. Patient taking differently: Chew 2 tablets by mouth daily. With iron 01/25/20  Yes Rehman, Mechele Dawley, MD  promethazine (PHENERGAN) 25 MG tablet Take 25 mg by mouth every 6 (six) hours as needed for vomiting or nausea.   Yes [provider]  traMADol (ULTRAM) 50 MG tablet Take 50 mg by mouth every 4 (four) hours as needed for pain. 01/09/20  Yes [provider]  vitamin B-12 1000 MCG tablet Take 1 tablet (1,000 mcg total) by mouth daily. Patient taking differently: Take 3,000 mcg by mouth daily. 01/13/20  Yes Johnson, Clanford L, MD  vitamin C (ASCORBIC ACID) 500 MG tablet Take 500 mg by mouth daily.   Yes [provider]      Allergies    Bee pollen, Bee venom, Hydrocodone-acetaminophen, Chocolate, Nsaids, Synthroid [levothyroxine sodium], Latex, and Neosporin [neomycin-bacitracin zn-polymyx]    Review of Systems   Review of Systems  Constitutional:  Negative for chills and fever.  HENT:  Negative for rhinorrhea and sore throat.   Eyes:  Negative for visual disturbance.  Respiratory:  Negative for cough and shortness of breath.   Cardiovascular:  Negative for chest pain and leg swelling.  Gastrointestinal:  Positive for blood in stool. Negative for abdominal pain, diarrhea, nausea and vomiting.  Genitourinary:  Negative for dysuria.  Musculoskeletal:  Negative for  back pain and neck pain.  Skin:  Negative for rash.  Neurological:  Negative for dizziness, light-headedness and headaches.  Hematological:  Does not bruise/bleed easily.  Psychiatric/Behavioral:  Negative for confusion.     Physical Exam Updated Vital Signs BP (!) 117/59   Pulse 94   Temp 98.3 F (36.8 C) (Oral)   Resp 19   Ht 1.778 m (_0 )   Wt 77.1 kg   LMP 08/14/2011   SpO2 97%   BMI 24.39 kg/m  Physical Exam Vitals and nursing note reviewed.  Constitutional:      General: She  is not in acute distress.    Appearance: Normal appearance. She is well-developed.  HENT:     Head: Normocephalic and atraumatic.     Mouth/Throat:     Mouth: Mucous membranes are moist.  Eyes:     Extraocular Movements: Extraocular movements intact.     Conjunctiva/sclera: Conjunctivae normal.     Pupils: Pupils are equal, round, and reactive to light.  Cardiovascular:     Rate and Rhythm: Regular rhythm. Tachycardia present.     Heart sounds: No murmur heard. Pulmonary:     Effort: Pulmonary effort is normal. No respiratory distress.     Breath sounds: Normal breath sounds.  Abdominal:     General: There is no distension.     Palpations: Abdomen is soft.     Tenderness: There is no abdominal tenderness. There is no guarding.  Genitourinary:    Rectum: Guaiac result positive.     Comments: Stool very dark and somewhat maroonish in color.  Very heme positive. Musculoskeletal:        General: No swelling.     Cervical back: Neck supple.  Skin:    General: Skin is warm and dry.     Capillary Refill: Capillary refill takes less than 2 seconds.     Coloration: Skin is pale.  Neurological:     General: No focal deficit present.     Mental Status: She is alert and oriented to person, place, and time.     Cranial Nerves: No cranial nerve deficit.     Sensory: No sensory deficit.  Psychiatric:        Mood and Affect: Mood normal.     ED Results / Procedures / Treatments   Labs (all labs ordered are listed, but only abnormal results are displayed) Labs Reviewed  COMPREHENSIVE METABOLIC PANEL - Abnormal; Notable for the following components:      Result Value   Chloride 112 (*)    Glucose, Bld 105 (*)    Calcium 8.8 (*)    Total Protein 5.9 (*)    Albumin 2.9 (*)    Alkaline Phosphatase 170 (*)    Total Bilirubin 0.2 (*)    All other components within normal limits  CBC - Abnormal; Notable for the following components:   RBC 3.03 (*)    Hemoglobin 9.1 (*)    HCT 28.8  (*)    All other components within normal limits  POC OCCULT BLOOD, ED  TYPE AND SCREEN    EKG None  Radiology DG Chest Port 1 View  Result Date: 12/30/2021 CLINICAL DATA:  Shortness of breath. EXAM: PORTABLE CHEST 1 VIEW COMPARISON:  Jun 25, 2021. FINDINGS: The heart size and mediastinal contours are within normal limits. Both lungs are clear. The visualized skeletal structures are unremarkable. IMPRESSION: No active disease. Electronically Signed   By: Marijo Conception M.D.   On: 12/30/2021  18:33    Procedures Procedures    Medications Ordered in ED Medications  0.9 %  sodium chloride infusion (has no administration in time range)  pantoprozole (PROTONIX) 80 mg /NS 100 mL infusion (8 mg/hr Intravenous New Bag/Given 12/30/21 1901)  pantoprazole (PROTONIX) injection 40 mg (has no administration in time range)  sodium chloride 0.9 % bolus 1,000 mL (1,000 mLs Intravenous New Bag/Given 12/30/21 1833)  pantoprazole (PROTONIX) 80 mg /NS 100 mL IVPB (0 mg Intravenous Stopped 12/30/21 1900)    ED Course/ Medical Decision Making/ A&P                           Medical Decision Making Amount and/or Complexity of Data Reviewed Labs: ordered. Radiology: ordered.  Risk Prescription drug management. Decision regarding hospitalization.   CRITICAL CARE Performed by: Fredia Sorrow Total critical care time: 45 minutes Critical care time was exclusive of separately billable procedures and treating other patients. Critical care was necessary to treat or prevent imminent or life-threatening deterioration. Critical care was time spent personally by me on the following activities: development of treatment plan with patient and/or surrogate as well as nursing, discussions with consultants, evaluation of patient's response to treatment, examination of patient, obtaining history from patient or surrogate, ordering and performing treatments and interventions, ordering and review of laboratory  studies, ordering and review of radiographic studies, pulse oximetry and re-evaluation of patient's condition.  Patient with melena.  Patient tachycardic but not hypotensive.  Stool heme positive.  Patient started on Protonix drip.  Patient will given 1 L of normal saline type and screen done.  Hemoglobin came back at 9.1.  Does not need transfusion at this time but may very well need it.  Discussed with Dr.Castaneda who will see patient in consultation once her n.p.o. after midnight.  Patient can have clear liquids up to that point in time.  Discussed with the hospitalist who will admit.  Complete metabolic panel no significant electrolyte abnormalities renal function normal GFR greater than 60 LFTs normal other than alk phos 170.  CBC white count 5.5 hemoglobin 9.1 and platelets 244.  Chest x-ray without any active disease.  EKG consistent with sinus tach  Final Clinical Impression(s) / ED Diagnoses Final diagnoses:  Acute GI bleeding  Melena    Rx / DC Orders ED Discharge Orders     None         Fredia Sorrow, MD 12/30/21 Einar Crow

## 2021-12-30 NOTE — ED Notes (Signed)
Pt ambulatory to the restroom with even, steady gait.

## 2021-12-30 NOTE — ED Triage Notes (Signed)
Pt passing black stools started yesterday, pt feels tired, pt has a hx of gi bleed.

## 2021-12-30 NOTE — H&P (Signed)
History and Physical    Patient: [name] JME:[id number] DOB: [birth year] DOA: 12/30/2021 DOS: the patient was seen and examined on 12/30/2021 PCP: Assunta Found, MD  Patient coming from: Home  Chief Complaint:  Chief Complaint  Patient presents with   Rectal Bleeding   HPI: [name] is a 61 y.o. female with medical history significant of hypertension, hypothyroidism, iron deficiency anemia, hypothyroidism, history of gastric bypass, and depression who presents with complaints of dark bloody stools.  Patient reports having 1 stool yesterday that was formed and another 1 today.  Noted associated symptoms of generalized weakness and fatigue.  Denies having any abdominal pain, nausea, vomiting, or diarrhea symptoms.  She is not on any blood thinners and does not take any NSAIDs.  She had recently had a cold with sinus congestion and headache for which she had been taking DayQuil and NyQuil for several days, but reports those symptoms have improved.  Patient had last been hospitalized in 01/2020 for GI bleed that was thought secondary to Aleve use.  She had required 4 units of packed red blood cells and undergone EGD which revealed 4 mm polypoid nodule adjacent to the anastomosis from prior gastric bypass and a circumferential ulceration of mucosa near the anastomosis that was thought to be the likely source of GI bleed.  She had repeat EGD 4/22 that noted complete healing of the ulcer.  In the emergency department patient was noted to be afebrile with tachycardia, and all other vital signs were noted to be relatively stable.  Labs revealed hemoglobin 9.1 g/dL(hemoglobin 22.9 on 6/2).  Patient has been given 1 L normal saline IV fluids and started on a Protonix drip. Dr. Levon Hedger of GI was consulted.  [facility] called to admit.  Review of Systems: As mentioned in the history of present illness. All other systems reviewed and are negative. Past Medical History:  Diagnosis Date   Depression     started Lexapro when husband passed away   Hypertension    Hypothyroidism    IBS (irritable bowel syndrome)    Iron deficiency anemia 04/11/2021   PONV (postoperative nausea and vomiting)    Past Surgical History:  Procedure Laterality Date   ABDOMINAL HYSTERECTOMY  08/11/2011   Procedure: HYSTERECTOMY ABDOMINAL;  Surgeon: [name], MD;  Location: [facility];  Service: Gynecology;  Laterality: N/A;   BIOPSY  01/11/2020   Procedure: BIOPSY;  Surgeon: Corbin Ade, MD;  Location: [facility];  Service: Endoscopy;;   CESAREAN SECTION     x 2   CHOLECYSTECTOMY  2009   [facility]   ESOPHAGOGASTRODUODENOSCOPY  Jan 2004   RMR: normal esophagus, small hiatal hernia, normal D1, D2   ESOPHAGOGASTRODUODENOSCOPY (EGD) WITH PROPOFOL N/A 01/11/2020   Procedure: ESOPHAGOGASTRODUODENOSCOPY (EGD) WITH PROPOFOL;  Surgeon: Corbin Ade, MD;  [facility]: [facility];  Service: Endoscopy;  Laterality: N/A;   ESOPHAGOGASTRODUODENOSCOPY (EGD) WITH PROPOFOL N/A 06/05/2020   Procedure: ESOPHAGOGASTRODUODENOSCOPY (EGD) WITH PROPOFOL;  Surgeon: Malissa Hippo, MD;  Location: [facility];  Service: Endoscopy;  Laterality: N/A;  am   FOOT SURGERY     x 5 right   GASTRIC BYPASS     gastric stent  2020   HAND SURGERY     x2 right   HEMATOMA EVACUATION  08/14/2011   Procedure: EVACUATION HEMATOMA;  Surgeon: [name], MD;  Location: [facility];  Service: Gynecology;  Laterality: N/A;   SALPINGOOPHORECTOMY  08/11/2011   Procedure: SALPINGO OOPHERECTOMY;  Surgeon:  [name], MD;  Location: [facility];  Service: Gynecology;  Laterality: Bilateral;   SCAR REVISION  08/11/2011   Procedure: SCAR REVISION;  Surgeon: [name], MD;  Location: [facility];  Service: Gynecology;  Laterality: N/A;  Excision of Wide Cicatrix   Social History:  reports that she has never smoked. She has never been exposed to tobacco smoke. She has never used smokeless tobacco. She reports that she does not drink alcohol and does not use  drugs.  Allergies  Allergen Reactions   Bee Pollen Anaphylaxis   Bee Venom Anaphylaxis and Itching    Swelling and itching   Hydrocodone-Acetaminophen Itching   Chocolate Other (See Comments)    Reaction: causes facial blistering,inside mouth   Nsaids Nausea And Vomiting   Synthroid [Levothyroxine Sodium] Itching and Swelling    Patient has a reaction to brand name synthroid only   Latex Rash   Neosporin [Neomycin-Bacitracin Zn-Polymyx] Rash    Family History  Problem Relation Age of Onset   Colon cancer Neg Hx     Prior to Admission medications   Medication Sig Start Date End Date Taking? Authorizing Provider  ALPRAZolam Prudy Feeler) 0.5 MG tablet Take 0.5 mg by mouth at bedtime.    [provider]  Biotin 5 MG TABS Take 5 mg by mouth daily.    [provider]  bismuth subsalicylate (PEPTO BISMOL) 262 MG/15ML suspension Take 30 mLs by mouth every 6 (six) hours as needed for diarrhea or loose stools or indigestion.    [provider]  diphenhydrAMINE (BENADRYL) 25 mg capsule Take 50 mg by mouth in the morning and at bedtime.    [provider]  levothyroxine (SYNTHROID, LEVOTHROID) 125 MCG tablet Take 125 mcg by mouth daily before breakfast.  06/11/15   [provider]  pantoprazole (PROTONIX) 40 MG tablet Take 1 tablet (40 mg total) by mouth daily before breakfast. 06/05/20   Rehman, Joline Maxcy, MD  Pediatric Multivitamins-Iron (FLINTSTONES COMPLETE) 18 MG CHEW Chew 1 tablet by mouth in the morning and at bedtime. Patient taking differently: Chew 2 tablets by mouth daily. 01/25/20   Malissa Hippo, MD  promethazine (PHENERGAN) 25 MG tablet Take 25 mg by mouth every 6 (six) hours as needed for vomiting or nausea.    [provider]  traMADol (ULTRAM) 50 MG tablet Take 50 mg by mouth every 4 (four) hours as needed for pain. 01/09/20   [provider]  vitamin B-12 1000 MCG tablet Take 1 tablet (1,000 mcg total) by mouth  daily. Patient taking differently: Take 3,000 mcg by mouth daily. 01/13/20   Johnson, Clanford L, MD  vitamin C (ASCORBIC ACID) 500 MG tablet Take 500 mg by mouth daily.    [provider]    Physical Exam: Vitals:   12/30/21 1619 12/30/21 1645 12/30/21 1700 12/30/21 1800  BP:  118/60 117/61 (!) 117/59  Pulse:  (!) 101 94 94  Resp:  16 16 19   Temp:      TempSrc:      SpO2:  99% 99% 97%  Weight: 77.1 kg     Height: 5\' 10"  (1.778 m)       Constitutional: Older adult female currently in no acute distress Eyes: PERRL, lids and conjunctivae normal ENMT: Mucous membranes are moist.  Neck: normal, supple   Respiratory: clear to auscultation bilaterally, no wheezing, no crackles. Normal respiratory effort. No accessory muscle use.  Cardiovascular: Tachycardic, no murmurs / rubs / gallops. No extremity  edema.   Abdomen: no tenderness, no masses palpated.  Bowel sounds present all 4 quadrants Musculoskeletal: no clubbing / cyanosis. No joint deformity upper and lower extremities. Good ROM, no contractures. Normal muscle tone.  Skin: Pallor. Neurologic: CN 2-12 grossly intact.  Strength 5/5 in all 4.  Psychiatric: Normal judgment and insight. Alert and oriented x 3. Normal mood.   Data Reviewed:  Labs, imaging, and pertinent records as noted above in HPI  Assessment and Plan: Acute blood loss secondary to GI bleed Patient presents after having 2 melanotic stools since yesterday.  Hemoglobin previously had been around 13.7g/dL in June, but acutely noted to be 9.1 g/dL today with normal MCV and MCH.  Stool guaiacs were noted to be positive.  Prior iron studies were noted to be within normal limits.  Chest x-ray showed no acute abnormality.  She had been typed and screened for possible need of blood products.  Patient has been bolused 1 L of IV fluids and placed on a Protonix drip.  Suspect likely upper GI bleed -Admit to a stepdown bed -Monitor intake and output -Clear liquid diet  and n.p.o. after midnight -Continue Protonix drip per protocol -Serial monitoring of H&H every 8 hours -Transfuse blood products as needed for hemoglobins less than 8 g/dL or patient becomes symptomatic -Appreciate GI consultative services, will follow-up for any further recommendations  Hypothyroidism -Continue levothyroxine   General anxiety -Continue Xanax at bedtime  History of gastric bypass Patient had prior history of gastric bypass over 7 years ago.  Course was complicated by gastric ulcers and esophageal stricture requiring placement of esophageal stent which has since been removed.  Gerd history of PUD Patient with history of ulcer near prior anastomosis of gastric bypass.  Also was noted to be healed as of last EGD from 05/2020. -Continue Protonix drip  DVT prophylaxis: SCDs  Advance Care Planning:   Code Status: Full Code   Consults: GI  Family Communication: Sister died at bedside  Severity of Illness: The appropriate patient status for this patient is OBSERVATION. Observation status is judged to be reasonable and necessary in order to provide the required intensity of service to ensure the patient's safety. The patient's presenting symptoms, physical exam findings, and initial radiographic and laboratory data in the context of their medical condition is felt to place them at decreased risk for further clinical deterioration. Furthermore, it is anticipated that the patient will be medically stable for discharge from the hospital within 2 midnights of admission.   Author: Clydie Braun, MD 12/30/2021 6:51 PM  For on call review [email or web address].

## 2021-12-31 ENCOUNTER — Encounter (HOSPITAL_COMMUNITY): Payer: Self-pay | Admitting: Internal Medicine

## 2021-12-31 ENCOUNTER — Observation Stay (HOSPITAL_COMMUNITY): Admitting: Certified Registered"

## 2021-12-31 ENCOUNTER — Other Ambulatory Visit: Payer: Self-pay

## 2021-12-31 ENCOUNTER — Encounter (HOSPITAL_COMMUNITY)
Admission: EM | Disposition: A | Discharge status: Home / Self Care | Payer: Self-pay | Source: Home / Self Care | Attending: Family Medicine

## 2021-12-31 DIAGNOSIS — E876 Hypokalemia: Secondary | ICD-10-CM | POA: Diagnosis present

## 2021-12-31 DIAGNOSIS — Z888 Allergy status to other drugs, medicaments and biological substances status: Secondary | ICD-10-CM | POA: Diagnosis not present

## 2021-12-31 DIAGNOSIS — D638 Anemia in other chronic diseases classified elsewhere: Secondary | ICD-10-CM | POA: Diagnosis not present

## 2021-12-31 DIAGNOSIS — K289 Gastrojejunal ulcer, unspecified as acute or chronic, without hemorrhage or perforation: Secondary | ICD-10-CM | POA: Diagnosis not present

## 2021-12-31 DIAGNOSIS — K284 Chronic or unspecified gastrojejunal ulcer with hemorrhage: Secondary | ICD-10-CM | POA: Diagnosis present

## 2021-12-31 DIAGNOSIS — F411 Generalized anxiety disorder: Secondary | ICD-10-CM | POA: Diagnosis present

## 2021-12-31 DIAGNOSIS — K259 Gastric ulcer, unspecified as acute or chronic, without hemorrhage or perforation: Secondary | ICD-10-CM

## 2021-12-31 DIAGNOSIS — Z91030 Bee allergy status: Secondary | ICD-10-CM | POA: Diagnosis not present

## 2021-12-31 DIAGNOSIS — K922 Gastrointestinal hemorrhage, unspecified: Secondary | ICD-10-CM | POA: Diagnosis not present

## 2021-12-31 DIAGNOSIS — I10 Essential (primary) hypertension: Secondary | ICD-10-CM | POA: Diagnosis present

## 2021-12-31 DIAGNOSIS — F32A Depression, unspecified: Secondary | ICD-10-CM | POA: Diagnosis present

## 2021-12-31 DIAGNOSIS — Z91018 Allergy to other foods: Secondary | ICD-10-CM | POA: Diagnosis not present

## 2021-12-31 DIAGNOSIS — Z79890 Hormone replacement therapy (postmenopausal): Secondary | ICD-10-CM | POA: Diagnosis not present

## 2021-12-31 DIAGNOSIS — Z90710 Acquired absence of both cervix and uterus: Secondary | ICD-10-CM | POA: Diagnosis not present

## 2021-12-31 DIAGNOSIS — Z9049 Acquired absence of other specified parts of digestive tract: Secondary | ICD-10-CM | POA: Diagnosis not present

## 2021-12-31 DIAGNOSIS — Z885 Allergy status to narcotic agent status: Secondary | ICD-10-CM | POA: Diagnosis not present

## 2021-12-31 DIAGNOSIS — K219 Gastro-esophageal reflux disease without esophagitis: Secondary | ICD-10-CM | POA: Diagnosis present

## 2021-12-31 DIAGNOSIS — Z91040 Latex allergy status: Secondary | ICD-10-CM | POA: Diagnosis not present

## 2021-12-31 DIAGNOSIS — Z634 Disappearance and death of family member: Secondary | ICD-10-CM | POA: Diagnosis not present

## 2021-12-31 DIAGNOSIS — D62 Acute posthemorrhagic anemia: Secondary | ICD-10-CM | POA: Diagnosis present

## 2021-12-31 DIAGNOSIS — E039 Hypothyroidism, unspecified: Secondary | ICD-10-CM

## 2021-12-31 DIAGNOSIS — Z9884 Bariatric surgery status: Secondary | ICD-10-CM | POA: Diagnosis not present

## 2021-12-31 DIAGNOSIS — K589 Irritable bowel syndrome without diarrhea: Secondary | ICD-10-CM | POA: Diagnosis not present

## 2021-12-31 DIAGNOSIS — K921 Melena: Secondary | ICD-10-CM | POA: Diagnosis present

## 2021-12-31 DIAGNOSIS — Z79899 Other long term (current) drug therapy: Secondary | ICD-10-CM | POA: Diagnosis not present

## 2021-12-31 HISTORY — PX: SCLEROTHERAPY: SHX6841

## 2021-12-31 HISTORY — PX: ESOPHAGOGASTRODUODENOSCOPY (EGD) WITH PROPOFOL: SHX5813

## 2021-12-31 LAB — HEMOGLOBIN AND HEMATOCRIT, BLOOD
HCT: 25.1 % — ABNORMAL LOW (ref 36.0–46.0)
HCT: 27.7 % — ABNORMAL LOW (ref 36.0–46.0)
Hemoglobin: 7.9 g/dL — ABNORMAL LOW (ref 12.0–15.0)
Hemoglobin: 8.7 g/dL — ABNORMAL LOW (ref 12.0–15.0)

## 2021-12-31 LAB — MRSA NEXT GEN BY PCR, NASAL: MRSA by PCR Next Gen: NOT DETECTED

## 2021-12-31 LAB — BASIC METABOLIC PANEL
Anion gap: 2 — ABNORMAL LOW (ref 5–15)
BUN: 13 mg/dL (ref 8–23)
CO2: 25 mmol/L (ref 22–32)
Calcium: 8 mg/dL — ABNORMAL LOW (ref 8.9–10.3)
Chloride: 116 mmol/L — ABNORMAL HIGH (ref 98–111)
Creatinine, Ser: 0.65 mg/dL (ref 0.44–1.00)
GFR, Estimated: >60 mL/min (ref >60)
Glucose, Bld: 93 mg/dL (ref 70–99)
Potassium: 3.3 mmol/L — ABNORMAL LOW (ref 3.5–5.1)
Sodium: 143 mmol/L (ref 135–145)

## 2021-12-31 LAB — MAGNESIUM: Magnesium: 1.9 mg/dL (ref 1.7–2.4)

## 2021-12-31 SURGERY — ESOPHAGOGASTRODUODENOSCOPY (EGD) WITH PROPOFOL
Anesthesia: General

## 2021-12-31 MED ORDER — PROPOFOL 10 MG/ML IV BOLUS
INTRAVENOUS | Status: AC
  Filled 2021-12-31: qty 20

## 2021-12-31 MED ORDER — LACTATED RINGERS IV SOLN: INTRAVENOUS | Status: DC

## 2021-12-31 MED ORDER — STERILE WATER FOR IRRIGATION IR SOLN
Status: DC | PRN
  Administered 2021-12-31: .6 mL

## 2021-12-31 MED ORDER — SODIUM CHLORIDE (PF) 0.9 % IJ SOLN
PREFILLED_SYRINGE | INTRAMUSCULAR | Status: DC | PRN
  Administered 2021-12-31: 2 mL

## 2021-12-31 MED ORDER — ESMOLOL HCL 100 MG/10ML IV SOLN
INTRAVENOUS | Status: AC
  Filled 2021-12-31: qty 10

## 2021-12-31 MED ORDER — POTASSIUM CHLORIDE CRYS ER 20 MEQ PO TBCR: 40.0000 meq | EXTENDED_RELEASE_TABLET | Freq: Once | ORAL | Status: DC

## 2021-12-31 MED ORDER — METOPROLOL TARTRATE 5 MG/5ML IV SOLN
2.5000 mg | Freq: Once | INTRAVENOUS | Status: AC
  Administered 2021-12-31: 2.5 mg via INTRAVENOUS
  Filled 2021-12-31: qty 5

## 2021-12-31 MED ORDER — POTASSIUM CHLORIDE 10 MEQ/100ML IV SOLN
10.0000 meq | INTRAVENOUS | Status: AC
  Administered 2021-12-31 (×2): 10 meq via INTRAVENOUS
  Filled 2021-12-31: qty 100

## 2021-12-31 MED ORDER — PROPOFOL 10 MG/ML IV BOLUS
INTRAVENOUS | Status: DC | PRN
  Administered 2021-12-31 (×2): 20 mg via INTRAVENOUS
  Administered 2021-12-31: 30 mg via INTRAVENOUS
  Administered 2021-12-31: 50 mg via INTRAVENOUS

## 2021-12-31 MED ORDER — LACTATED RINGERS IV SOLN: INTRAVENOUS | Status: DC | PRN

## 2021-12-31 MED ORDER — EPINEPHRINE 1 MG/10ML IJ SOSY
PREFILLED_SYRINGE | INTRAMUSCULAR | Status: AC
  Filled 2021-12-31: qty 10

## 2021-12-31 MED ORDER — ESMOLOL HCL 100 MG/10ML IV SOLN
INTRAVENOUS | Status: DC | PRN
  Administered 2021-12-31: 10 mg via INTRAVENOUS
  Administered 2021-12-31: 20 mg via INTRAVENOUS

## 2021-12-31 MED ORDER — SODIUM CHLORIDE 0.9 % IV SOLN: INTRAVENOUS | Status: DC

## 2021-12-31 NOTE — Op Note (Signed)
[facility] Patient Name: [name] Procedure Date: 12/31/2021 9:47 AM MRN: [id number] Date of Birth: [birth year] Attending MD: Gennette Pac , MD, [id number] CSN: [id number] Age: 61 Admit Type: Outpatient Procedure:                Upper GI endoscopy Indications:              Melena Providers:                Gennette Pac, MD, Buel Ream. Thomasena Edis RN, RN,                            Durwin Glaze Tech, Technician Referring MD:              Medicines:                Propofol per Anesthesia Complications:            No immediate complications. Estimated Blood Loss:     Estimated blood loss was minimal. Procedure:                Pre-Anesthesia Assessment:                           - Prior to the procedure, a History and Physical                            was performed, and patient medications and                            allergies were reviewed. The patient's tolerance of                            previous anesthesia was also reviewed. The risks                            and benefits of the procedure and the sedation                            options and risks were discussed with the patient.                            All questions were answered, and informed consent                            was obtained. Prior Anticoagulants: The patient has                            taken no anticoagulant or antiplatelet agents. ASA                            Grade Assessment: III - A patient with severe                            systemic disease. After reviewing the risks and  benefits, the patient was deemed in satisfactory                            condition to undergo the procedure.                           After obtaining informed consent, the endoscope was                            passed under direct vision. Throughout the                            procedure, the patient's blood pressure, pulse, and                            oxygen  saturations were monitored continuously.The                            upper GI endoscopy was accomplished without                            difficulty. The patient tolerated the procedure                            well. The [id number]-[id number] ([id number] scope was introduced                            through the and advanced to the proximal jejunum. Scope In: 10:24:57 AM Scope Out: 10:41:25 AM Total Procedure Duration: 0 hours 16 minutes 28 seconds  Findings:      The examined esophagus was normal. Surgically altered stomach.       Consistent with prior history of gastric bypass surgery. Surgical       anastomosis stenosed and friable fairly easily traversed with the adult       gastroscope, however. Through the area of stenosis there was extensive       circumferential ulceration. Diffusely friable. However, there was a       distinct visible vessel present. See photos. As I inspected the E ferret       limb (which appeared normal), there is brisk bleeding which developed       from the site of visible vessel. Treatment of this bleeder was       initiated. Total of 2 cc of 1 10,000 epinephrine injected submucosally       with a nice blanching slowing of the bleeder. Subsequently, the gold       probe at 20 J each was employed times several applications to seal the       bleeder. This was done without difficulty or apparent complication. Impression:               Surgically altered stomach consistent with prior                            history of RYGB. Stenotic anastomosis with  extensive anastomotic ulceration and visible vessel.                           -Status post endoscopic bleeding control therapy as                            described above.                           -I suspect surreptitious NSAID use. H. pylori                            negative by histology 2022. Will double check                            negative status with serologies.                            Normal-appearing ED apparently                           - Normal esophagus.                           - No specimens collected. Moderate Sedation:      Moderate (conscious) sedation was personally administered by an       anesthesia professional. The following parameters were monitored: oxygen       saturation, heart rate, blood pressure, respiratory rate, EKG, adequacy       of pulmonary ventilation, and response to care. Recommendation:           - Return patient to ICU for ongoing care.                           - Clear liquid diet. Continue IV PPI infusion x 72                            hours. Trend H&H. Check H. pylori serologies.                            Repeat EGD in 12 weeks Procedure Code(s):        --- Professional ---                           [phone], Esophagogastroduodenoscopy, flexible,                            transoral; diagnostic, including collection of                            specimen(s) by brushing or washing, when performed                            (separate procedure) Diagnosis Code(s):        --- Professional ---  K92.1, Melena (includes Hematochezia) CPT copyright 2022 American Medical Association. All rights reserved. The codes documented in this report are preliminary and upon coder review may  be revised to meet current compliance requirements. Gerrit Friends. Tydarius Yawn, MD Gennette Pac, MD 12/31/2021 11:49:40 AM This report has been signed electronically. Number of Addenda: 0

## 2021-12-31 NOTE — TOC Progression Note (Signed)
Transition of Care [facility]) - Progression Note    Patient Details  Name: [name] MRN: [id number] Date of Birth: February 19, 1960  Transition of Care [facility]) CM/SW Contact  Karn Cassis, [place] Phone Number: 12/31/2021, 10:42 AM  Clinical Narrative:  Transition of Care [facility]) Screening Note   Patient Details  Name: [name] Date of Birth: [birth year]   Transition of Care [facility]) CM/SW Contact:    Karn Cassis, LCSW Phone Number: 12/31/2021, 10:42 AM    Transition of Care Department [facility]) has reviewed patient and no TOC needs have been identified at this time. We will continue to monitor patient advancement through interdisciplinary progression rounds. If new patient transition needs arise, please place a TOC consult.             Expected Discharge Plan and Services                                                 Social Determinants of Health (SDOH) Interventions    Readmission Risk Interventions     No data to display

## 2021-12-31 NOTE — Hospital Course (Signed)
61 y.o. female with medical history significant of hypertension, hypothyroidism, iron deficiency anemia, hypothyroidism, history of gastric bypass, and depression who presents with complaints of dark bloody stools.  Patient reports having 1 stool yesterday that was formed and another 1 today.  Noted associated symptoms of generalized weakness and fatigue.  Denies having any abdominal pain, nausea, vomiting, or diarrhea symptoms.  She is not on any blood thinners and does not take any NSAIDs.  She had recently had a cold with sinus congestion and headache for which she had been taking DayQuil and NyQuil for several days, but reports those symptoms have improved.  Patient had last been hospitalized in 01/2020 for GI bleed that was thought secondary to Aleve use.  She had required 4 units of packed red blood cells and undergone EGD which revealed 4 mm polypoid nodule adjacent to the anastomosis from prior gastric bypass and a circumferential ulceration of mucosa near the anastomosis that was thought to be the likely source of GI bleed.  She had repeat EGD 4/22 that noted complete healing of the ulcer.   In the emergency department patient was noted to be afebrile with tachycardia, and all other vital signs were noted to be relatively stable.  Labs revealed hemoglobin 9.1 g/dL(hemoglobin 82.9 on 6/2).  Patient has been given 1 L normal saline IV fluids and started on a Protonix drip. Dr. Levon Hedger of GI was consulted.  [facility] called to admit

## 2021-12-31 NOTE — Transfer of Care (Signed)
Immediate Anesthesia Transfer of Care Note  Patient: LISET MCMONIGLE  Procedure(s) Performed: ESOPHAGOGASTRODUODENOSCOPY (EGD) WITH PROPOFOL  Patient Location: PACU  Anesthesia Type:General  Level of Consciousness: awake, alert , oriented, and patient cooperative  Airway & Oxygen Therapy: Patient Spontanous Breathing and Patient connected to nasal cannula oxygen  Post-op Assessment: Report given to RN and Post -op Vital signs reviewed and stable  Post vital signs: Reviewed and stable  Last Vitals:  Vitals Value Taken Time  BP    Temp    Pulse 100 12/31/21 1048  Resp 13 12/31/21 1048  SpO2 95 % 12/31/21 1048  Vitals shown include unvalidated device data.  Last Pain:  Vitals:   12/31/21 1008  TempSrc:   PainSc: 0-No pain         Complications: No notable events documented.

## 2021-12-31 NOTE — Anesthesia Preprocedure Evaluation (Signed)
Anesthesia Evaluation  Patient identified by MRN, date of birth, ID band Patient awake    Reviewed: Allergy & Precautions, H&P , NPO status , Patient's Chart, lab work & pertinent test results, reviewed documented beta blocker date and time   History of Anesthesia Complications (+) PONV and history of anesthetic complications  Airway Mallampati: II  TM Distance: >3 FB Neck ROM: full    Dental no notable dental hx.    Pulmonary neg pulmonary ROS   Pulmonary exam normal breath sounds clear to auscultation       Cardiovascular Exercise Tolerance: Good hypertension, negative cardio ROS  Rhythm:regular Rate:Normal     Neuro/Psych  PSYCHIATRIC DISORDERS Anxiety Depression    negative neurological ROS     GI/Hepatic Neg liver ROS, PUD,,,  Endo/Other  Hypothyroidism    Renal/GU negative Renal ROS  negative genitourinary   Musculoskeletal   Abdominal   Peds  Hematology  (+) Blood dyscrasia, anemia   Anesthesia Other Findings   Reproductive/Obstetrics negative OB ROS                             Anesthesia Physical Anesthesia Plan  ASA: 3 and emergent  Anesthesia Plan: General   Post-op Pain Management:    Induction:   PONV Risk Score and Plan: Propofol infusion  Airway Management Planned:   Additional Equipment:   Intra-op Plan:   Post-operative Plan:   Informed Consent: I have reviewed the patients History and Physical, chart, labs and discussed the procedure including the risks, benefits and alternatives for the proposed anesthesia with the patient or authorized representative who has indicated his/her understanding and acceptance.     Dental Advisory Given  Plan Discussed with: CRNA  Anesthesia Plan Comments:        Anesthesia Quick Evaluation

## 2021-12-31 NOTE — Progress Notes (Signed)
PROGRESS NOTE   [name]  GEX:[id number] DOB: [birth year] DOA: 12/30/2021 PCP: Tracy Found, MD   Chief Complaint  Patient presents with   Rectal Bleeding   Level of care: Telemetry  Brief Admission History:   60 y.o. female with medical history significant of hypertension, hypothyroidism, iron deficiency anemia, hypothyroidism, history of gastric bypass, and depression who presents with complaints of dark bloody stools.  Patient reports having 1 stool yesterday that was formed and another 1 today.  Noted associated symptoms of generalized weakness and fatigue.  Denies having any abdominal pain, nausea, vomiting, or diarrhea symptoms.  She is not on any blood thinners and does not take any NSAIDs.  She had recently had a cold with sinus congestion and headache for which she had been taking DayQuil and NyQuil for several days, but reports those symptoms have improved.  Patient had last been hospitalized in 01/2020 for GI bleed that was thought secondary to Aleve use.  She had required 4 units of packed red blood cells and undergone EGD which revealed 4 mm polypoid nodule adjacent to the anastomosis from prior gastric bypass and a circumferential ulceration of mucosa near the anastomosis that was thought to be the likely source of GI bleed.  She had repeat EGD 4/22 that noted complete healing of the ulcer.   In the emergency department patient was noted to be afebrile with tachycardia, and all other vital signs were noted to be relatively stable.  Labs revealed hemoglobin 9.1 g/dL(hemoglobin 01.0 on 6/2).  Patient has been given 1 L normal saline IV fluids and started on a Protonix drip. Dr. Levon Rush of GI was consulted.  [facility] called to admit   Assessment and Plan:  Large anastomotic ulceration - seen on EGD 11/22 Dr. 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 Jena Rush  Antimicrobials:    Subjective: Pt agreeable to EGD today.     Objective: Vitals:   12/31/21 1046 12/31/21 1100 12/31/21 1135 12/31/21 1200  BP: 103/60 111/67  (!) 123/58  Pulse: 97 88  81  Resp: 14 15  (!) 9  Temp: 98.1 F (36.7 C)  98.1 F (36.7 C)   TempSrc:   Oral   SpO2: 98% 100%  99%  Weight:      Height:        Intake/Output Summary (Last 24 hours) at 12/31/2021 1301 Last data filed at 12/31/2021 1044 Gross per 24 hour  Intake 2493.49 ml  Output 50 ml  Net 2443.49 ml   Filed Weights   12/30/21 2120 12/31/21 0453 12/31/21 0940  Weight: 79.8 kg 79.9 kg 79 kg   Examination:  General exam: Appears calm and comfortable  Respiratory system: Clear to auscultation. Respiratory effort normal. Cardiovascular system: normal S1 & S2 heard. No JVD, murmurs, rubs, gallops or clicks. No pedal edema. Gastrointestinal system: Abdomen is nondistended, soft and nontender. No organomegaly or masses felt. Normal bowel sounds heard. Central nervous system: Alert and oriented. No focal neurological  deficits. Extremities: Symmetric 5 x 5 power. Skin: No rashes, lesions or ulcers. Psychiatry: Judgement and insight appear normal. Mood & affect appropriate.   Data Reviewed: I have personally reviewed following labs and imaging studies  CBC: Recent Labs  Lab 12/30/21 1650 12/30/21 2020 12/31/21 0448 12/31/21 1153  WBC 5.5  --   --   --   HGB 9.1* 8.6* 7.9* 8.7*  HCT 28.8* 27.3* 25.1* 27.7*  MCV 95.0  --   --   --   PLT 244  --   --   --     Basic Metabolic Panel: Recent Labs  Lab 12/30/21 1650  12/31/21 0448 12/31/21 0500  NA 141 143  --   K 3.5 3.3*  --   CL 112* 116*  --   CO2 24 25  --   GLUCOSE 105* 93  --   BUN 22 13  --   CREATININE 0.79 0.65  --   CALCIUM 8.8* 8.0*  --   MG  --   --  1.9    CBG: No results for input(s): "GLUCAP" in the last 168 hours.  Recent Results (from the past 240 hour(s))  MRSA Next Gen by PCR, Nasal     Status: None   Collection Time: 12/31/21  7:45 AM   Specimen: Nasal Mucosa; Nasal Swab  Result Value Ref Range Status   MRSA by PCR Next Gen NOT DETECTED NOT DETECTED Final    Comment: (NOTE) The GeneXpert MRSA Assay (FDA approved for NASAL specimens only), is one component of a comprehensive MRSA colonization surveillance program. It is not intended to diagnose MRSA infection nor to guide or monitor treatment for MRSA infections. Test performance is not FDA approved in patients less than 8 years old. Performed at [facility], [address] [place], [place] [address]      Radiology Studies: [facility] Chest [facility] 1 View  Result Date: 12/30/2021 CLINICAL DATA:  Shortness of breath. EXAM: PORTABLE CHEST 1 VIEW COMPARISON:  Jun 25, 2021. FINDINGS: The heart size and mediastinal contours are within normal limits. Both lungs are clear. The visualized skeletal structures are unremarkable. IMPRESSION: No active disease. Electronically Signed   By: Lupita Raider M.D.   On: 12/30/2021 18:33    Scheduled Meds:  ALPRAZolam  0.5 mg Oral QHS   Chlorhexidine Gluconate Cloth  6 each Topical Q0600   levothyroxine  125 mcg Oral QAC breakfast   [START ON 01/03/2022] pantoprazole  40 mg Intravenous Q12H   sodium chloride flush  3 mL Intravenous Q12H   Continuous Infusions:  sodium chloride 70 mL/hr at 12/31/21 1144   pantoprazole 8 mg/hr (12/31/21 0809)     LOS: 0 days   Time spent: 36 mins  Tracy Mountz Laural Benes, MD How to contact the [facility] Attending or Consulting provider 7A - 7P or covering provider during after hours 7P -7A, for this patient?   Check the care team in [facility] and look for a) attending/consulting [facility] provider listed and b) the [facility] team listed Log into [email or web address] and use [place]'s universal password to access. If you do not have the password, please contact the hospital operator. Locate the [facility] provider you are looking for under [facility] and page to a number that you can be directly reached. If you still have difficulty reaching the provider, please page the [facility] (Director on Call) for the Hospitalists listed on amion for assistance.  12/31/2021, 1:01 PM

## 2021-12-31 NOTE — Progress Notes (Signed)
Report called and given to nurse on Dept. 300. Pt to be wheeled up to room 312 via WC.

## 2021-12-31 NOTE — Progress Notes (Signed)
Patient's heart rate sustaining in the 130's on cardiac monitor, nurse assessed patient, patient stated that she feels fine and had recently came back from using the restroom. Patient also stated that she has had multiple episodes of tachycardia during this admission. Nurse notified Dr. Carren Rang, new orders in chart. Will continue to monitor.

## 2021-12-31 NOTE — Consult Note (Signed)
Gastroenterology Consult   Referring Provider: No ref. provider found Primary Care Physician:  Sharilyn Sites, MD Primary Gastroenterologist:  previously Dr. Laural Golden  Patient ID: [name]; [id number]; 04/08/1960   Admit date: 12/30/2021  LOS: 0 days   Date of Consultation: 12/31/2021  Reason for Consultation:  melena  History of Present Illness   [name] is a 61 y.o. 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  She presented with 2-day history of melanotic stools and generalized weakness and fatigue.   ED Course: Tachycardic (115-140), BP normal.  IV fluid bolus given. Labs: Hemoglobin 9.1 (13.6), MCV 95, platelets 244 albumin 2.9, alk phos 170 (chronically elevated), T. bili 0.2, BUN 22, potassium 3.5 Heme positive. Chest x-ray with no active disease. Started on PPI infusion   Consult: Per review of chart, in 2020/2021 patient was experiencing ongoing dysphagia and was found to have an anastomotic gastrojejunostomy stricture and underwent multiple EGDs for placement of stent and subsequent removal of stent in early 2021.  Patient reported a prior history of frequent Advil use after her gastric bypass. She then reported history of a GE stent and after removal she developed melena and dizziness and had an EGD and found to have an ulcer.  At this time she was given about 4 units total during her hospitalization.  She was very symptomatic at that time with dizziness and near syncope as well as tachycardia. Also without any abdominal pain at that time.   States the day before yesterday she began noticing black stools like tar (2-3 stools). Has not had  a BM since admission. No BRBPR. Denies abdominal pain, cramping, burning, reflux. Began feeling very fatigued yesterday afternoon and looked  into the mirror and realized she was pale. Denies shortness of breath or chest pain. No constipation. Has baseline IBS-diarrhea. Only thing she has taken within the last 8 days is over the counter Dayquil and Nyquil tablets for sinus troubles including cough and cold as well. Has very mild symptoms now.  Denies any NSAID use or alcohol use.  She frequently avoids fatty/spicy foods.  Also denies any aspirin powder use.  Denies any dysphagia.   Prior GI procedures: EGD 06/05/20:    EGD 01/11/20: -Normal esophagus s/p bariatric surgery -Nearly circumferential, deep anastomotic ulceration, surrounding friable mucosa -Gastric polyp not manipulated s/p gastric biopsy (mild chronic inflammation, negative H. Pylori) -Advised to avoid all NSAIDs -EGD in 3 months  Colonoscopy 09/07/2018: Report unavailable.   Colonoscopy 07/14/11: -Mild diverticulosis in the descending and sigmoid colon -Internal hemorrhoids    Past Medical History:  Diagnosis Date   Depression    started Lexapro when husband passed away   Hypertension    Hypothyroidism    IBS (irritable bowel syndrome)    Iron deficiency anemia 04/11/2021   PONV (postoperative nausea and vomiting)     Past Surgical History:  Procedure Laterality Date   ABDOMINAL HYSTERECTOMY  08/11/2011   Procedure: HYSTERECTOMY ABDOMINAL;  Surgeon: Jonnie Kind, MD;  Location: [facility];  Service: Gynecology;  Laterality: N/A;   BIOPSY  01/11/2020   Procedure: BIOPSY;  Surgeon: Daneil Dolin, MD;  Location: [facility];  Service: Endoscopy;;   CESAREAN SECTION     x 2   CHOLECYSTECTOMY  2009   [facility]   ESOPHAGOGASTRODUODENOSCOPY  Jan 2004   RMR:  normal esophagus, small hiatal hernia, normal D1, D2   ESOPHAGOGASTRODUODENOSCOPY (EGD) WITH PROPOFOL N/A 01/11/2020   Procedure: ESOPHAGOGASTRODUODENOSCOPY (EGD) WITH PROPOFOL;  Surgeon: Daneil Dolin, MD;  Location: [facility];  Service: Endoscopy;  Laterality: N/A;   ESOPHAGOGASTRODUODENOSCOPY (EGD) WITH  PROPOFOL N/A 06/05/2020   Procedure: ESOPHAGOGASTRODUODENOSCOPY (EGD) WITH PROPOFOL;  Surgeon: Rogene Houston, MD;  Location: [facility];  Service: Endoscopy;  Laterality: N/A;  am   FOOT SURGERY     x 5 right   GASTRIC BYPASS     gastric stent  2020   HAND SURGERY     x2 right   HEMATOMA EVACUATION  08/14/2011   Procedure: EVACUATION HEMATOMA;  Surgeon: Jonnie Kind, MD;  Location: [facility];  Service: Gynecology;  Laterality: N/A;   SALPINGOOPHORECTOMY  08/11/2011   Procedure: SALPINGO OOPHERECTOMY;  Surgeon: Jonnie Kind, MD;  Location: [facility];  Service: Gynecology;  Laterality: Bilateral;   SCAR REVISION  08/11/2011   Procedure: SCAR REVISION;  Surgeon: Jonnie Kind, MD;  Location: [facility];  Service: Gynecology;  Laterality: N/A;  Excision of Wide Cicatrix    Prior to Admission medications   Medication Sig Start Date End Date Taking? Authorizing Provider  ALPRAZolam Duanne Moron) 0.5 MG tablet Take 0.5 mg by mouth at bedtime.   Yes [provider]  Biotin 5 MG TABS Take 5 mg by mouth daily.   Yes [provider]  bismuth subsalicylate (PEPTO BISMOL) 262 MG/15ML suspension Take 30 mLs by mouth every 6 (six) hours as needed for diarrhea or loose stools or indigestion.   Yes [provider]  diphenhydrAMINE (BENADRYL) 25 mg capsule Take 50 mg by mouth in the morning and at bedtime.   Yes [provider]  levothyroxine (SYNTHROID, LEVOTHROID) 125 MCG tablet Take 125 mcg by mouth daily before breakfast.  06/11/15  Yes [provider]  pantoprazole (PROTONIX) 40 MG tablet Take 1 tablet (40 mg total) by mouth daily before breakfast. 06/05/20  Yes Rehman, Mechele Dawley, MD  Pediatric Multivitamins-Iron (FLINTSTONES COMPLETE) 18 MG CHEW Chew 1 tablet by mouth in the morning and at bedtime. Patient taking differently: Chew 2 tablets by mouth daily. With iron 01/25/20  Yes Rehman, Mechele Dawley, MD  promethazine (PHENERGAN) 25 MG tablet Take 25 mg by mouth every 6  (six) hours as needed for vomiting or nausea.   Yes [provider]  traMADol (ULTRAM) 50 MG tablet Take 50 mg by mouth every 4 (four) hours as needed for pain. 01/09/20  Yes [provider]  vitamin B-12 1000 MCG tablet Take 1 tablet (1,000 mcg total) by mouth daily. Patient taking differently: Take 3,000 mcg by mouth daily. 01/13/20  Yes Johnson, Clanford L, MD  vitamin C (ASCORBIC ACID) 500 MG tablet Take 500 mg by mouth daily.   Yes [provider]    Current Facility-Administered Medications  Medication Dose Route Frequency Provider Last Rate Last Admin   0.9 %  sodium chloride infusion   Intravenous Continuous Wynetta Emery, Clanford L, MD 70 mL/hr at 12/31/21 0809 Infusion Verify at 12/31/21 0809   acetaminophen (TYLENOL) tablet 650 mg  650 mg Oral Q6H PRN Norval Morton, MD       Or   acetaminophen (TYLENOL) suppository 650 mg  650 mg Rectal Q6H PRN Fuller Plan A, MD       albuterol (PROVENTIL) (2.5 MG/3ML) 0.083% nebulizer solution 2.5 mg  2.5 mg Nebulization Q6H PRN Norval Morton, MD  ALPRAZolam Duanne Moron) tablet 0.5 mg  0.5 mg Oral QHS Smith, Rondell A, MD   0.5 mg at 12/30/21 2308   Chlorhexidine Gluconate Cloth 2 % PADS 6 each  6 each Topical Q0600 Norval Morton, MD   6 each at 12/31/21 0646   levothyroxine (SYNTHROID) tablet 125 mcg  125 mcg Oral QAC breakfast Fuller Plan A, MD   125 mcg at 12/31/21 0646   ondansetron (ZOFRAN) tablet 4 mg  4 mg Oral Q6H PRN Norval Morton, MD       Or   ondansetron (ZOFRAN) injection 4 mg  4 mg Intravenous Q6H PRN Norval Morton, MD       [START ON 01/03/2022] pantoprazole (PROTONIX) injection 40 mg  40 mg Intravenous Q12H Fredia Sorrow, MD       pantoprozole (PROTONIX) 80 mg /NS 100 mL infusion  8 mg/hr Intravenous Continuous Fredia Sorrow, MD 10 mL/hr at 12/31/21 0809 8 mg/hr at 12/31/21 0809   potassium chloride 10 mEq in 100 mL IVPB  10 mEq Intravenous Q1 Hr x 3 Johnson, Clanford L, MD 100 mL/hr at  12/31/21 0836 10 mEq at 12/31/21 0836   sodium chloride flush (NS) 0.9 % injection 3 mL  3 mL Intravenous Q12H Smith, Rondell A, MD   3 mL at 12/31/21 0836   traMADol (ULTRAM) tablet 50 mg  50 mg Oral Q4H PRN Norval Morton, MD        Allergies as of 12/30/2021 - Review Complete 12/30/2021  Allergen Reaction Noted   Bee pollen Anaphylaxis 08/04/2011   Bee venom Anaphylaxis and Itching 01/12/2020   Hydrocodone-acetaminophen Itching 06/19/2015   Chocolate Other (See Comments) 07/02/2011   Nsaids Nausea And Vomiting 07/31/2011   Synthroid [levothyroxine sodium] Itching and Swelling 06/09/2011   Latex Rash 07/07/2011   Neosporin [neomycin-bacitracin zn-polymyx] Rash 07/07/2011    Family History  Problem Relation Age of Onset   Colon cancer Neg Hx     Social History   Socioeconomic History   Marital status: Widowed    Spouse name: Not on file   Number of children: Not on file   Years of education: Not on file   Highest education level: Not on file  Occupational History   Not on file  Tobacco Use   Smoking status: Never    Passive exposure: Never   Smokeless tobacco: Never  Vaping Use   Vaping Use: Never used  Substance and Sexual Activity   Alcohol use: No   Drug use: No   Sexual activity: Never  Other Topics Concern   Not on file  Social History Narrative   Husband passed away 8 years ago from [employer].    Social Determinants of Health   Financial Resource Strain: Not on file  Food Insecurity: No Food Insecurity (12/30/2021)   Hunger Vital Sign    Worried About Running Out of Food in the Last Year: Never true    Ran Out of Food in the Last Year: Never true  Transportation Needs: No Transportation Needs (12/30/2021)   PRAPARE - Hydrologist (Medical): No    Lack of Transportation (Non-Medical): No  Physical Activity: Not on file  Stress: Not on file  Social Connections: Not on file  Intimate Partner Violence: Not At Risk  (12/30/2021)   Humiliation, Afraid, Rape, and Kick questionnaire    Fear of Current or Ex-Partner: No    Emotionally Abused: No    Physically Abused: No  Sexually Abused: No     Review of Systems   Gen: + fatigue. Denies any fever, chills, loss of appetite, change in weight or weight loss CV: Denies chest pain, heart palpitations, syncope, edema  Resp: Denies shortness of breath with rest, cough, wheezing, coughing up blood, and pleurisy. GI: see HPI GU : Denies urinary burning, blood in urine, urinary frequency, and urinary incontinence. MS: Denies joint pain, limitation of movement, swelling, cramps, and atrophy.  Derm: Denies rash, itching, dry skin, hives. Psych: Denies depression, anxiety, memory loss, hallucinations, and confusion. Heme: Denies bruising or bleeding Neuro:  Denies any headaches, dizziness, paresthesias, shaking  Physical Exam   Vital Signs in last 24 hours: Temp:  [97.9 F (36.6 C)-98.5 F (36.9 C)] 98.4 F (36.9 C) (11/22 0735) Pulse Rate:  [84-115] 98 (11/22 0800) Resp:  [0-20] 17 (11/22 0800) BP: (99-138)/(38-73) 138/60 (11/22 0800) SpO2:  [94 %-100 %] 98 % (11/22 0800) Weight:  [77.1 kg-79.9 kg] 79.9 kg (11/22 0453) Last BM Date : 12/30/21  General:   Alert,  Well-developed, well-nourished, pleasant and cooperative in NAD Head:  Normocephalic and atraumatic. Eyes:  Sclera clear, no icterus.   Conjunctiva pink. Lungs:  Clear throughout to auscultation.   No wheezes, crackles, or rhonchi. No acute distress. Heart:  Regular rate and rhythm; no murmurs, clicks, rubs,  or gallops. Abdomen:  Soft, nontender and nondistended. No masses, hepatosplenomegaly or hernias noted. Normal bowel sounds, without guarding, and without rebound.   Rectal: deferred   Extremities:  Without clubbing or edema. Neurologic:  Alert and  oriented x4. Skin:  Intact without significant lesions or rashes. Psych:  Alert and cooperative. Normal mood and  affect.  Intake/Output from previous day: 11/21 0701 - 11/22 0700 In: 1100 [IV Piggyback:1100] Out: -  Intake/Output this shift: Total I/O In: 793.5 [I.V.:773.2; IV Piggyback:20.3] Out: -    Labs/Studies   Recent Labs Recent Labs    12/30/21 1650 12/30/21 2020 12/31/21 0448  WBC 5.5  --   --   HGB 9.1* 8.6* 7.9*  HCT 28.8* 27.3* 25.1*  PLT 244  --   --    BMET Recent Labs    12/30/21 1650 12/31/21 0448  NA 141 143  K 3.5 3.3*  CL 112* 116*  CO2 24 25  GLUCOSE 105* 93  BUN 22 13  CREATININE 0.79 0.65  CALCIUM 8.8* 8.0*   LFT Recent Labs    12/30/21 1650  PROT 5.9*  ALBUMIN 2.9*  AST 16  ALT 16  ALKPHOS 170*  BILITOT 0.2*   PT/INR No results for input(s): "LABPROT", "INR" in the last 72 hours. Hepatitis Panel No results for input(s): "HEPBSAG", "HCVAB", "HEPAIGM", "HEPBIGM" in the last 72 hours. C-Diff No results for input(s): "CDIFFTOX" in the last 72 hours.  Radiology/Studies DG Chest Port 1 View  Result Date: 12/30/2021 CLINICAL DATA:  Shortness of breath. EXAM: PORTABLE CHEST 1 VIEW COMPARISON:  Jun 25, 2021. FINDINGS: The heart size and mediastinal contours are within normal limits. Both lungs are clear. The visualized skeletal structures are unremarkable. IMPRESSION: No active disease. Electronically Signed   By: Marijo Conception M.D.   On: 12/30/2021 18:33     Assessment   [name] is a 61 y.o. 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  She presented with 2-day history of melanotic stools  and generalized weakness and fatigue.  Symptomatic anemia, melena: Patient with history of nearly circumferential gastric ulcer in December 2021 with follow-up EGD in April 2022 noting healed ulcer.  She has been maintained on pantoprazole 40 mg daily since and has been  avoiding all NSAIDs.  She presented to the hospital yesterday with reports of 2 days of melena and significant fatigue.  Also found to be tachycardic.  Hemoglobin 9.1 on admission (13.7 five months prior).  Currently without any dizziness or syncope.  Stool heme positive in the ED.  Currently denies any abdominal pain, nausea, vomiting, dysphagia, reflux.  Given patient's current symptoms, and her prior history of gastric ulcer, this is likely cause of her acute anemia.  Will evaluate with EGD today.  Will continue pantoprazole infusion today.  Continue to monitor H/H, transfuse for hemoglobin less than 7.   Plan / Recommendations   Due to monitor H/H, transfuse for hemoglobin less than 7 Continue pantoprazole infusion NPO EGD today with Dr. Gala Romney Avoid NSAIDs     12/31/2021, 8:39 AM  Venetia Night, MSN, FNP-BC, AGACNP-BC [facility]

## 2021-12-31 NOTE — Progress Notes (Signed)
Pt being transported down for EGD.

## 2022-01-01 DIAGNOSIS — K289 Gastrojejunal ulcer, unspecified as acute or chronic, without hemorrhage or perforation: Secondary | ICD-10-CM

## 2022-01-01 DIAGNOSIS — K921 Melena: Secondary | ICD-10-CM

## 2022-01-01 DIAGNOSIS — K922 Gastrointestinal hemorrhage, unspecified: Secondary | ICD-10-CM

## 2022-01-01 LAB — BASIC METABOLIC PANEL
Anion gap: 3 — ABNORMAL LOW (ref 5–15)
BUN: 5 mg/dL — ABNORMAL LOW (ref 8–23)
CO2: 25 mmol/L (ref 22–32)
Calcium: 8 mg/dL — ABNORMAL LOW (ref 8.9–10.3)
Chloride: 113 mmol/L — ABNORMAL HIGH (ref 98–111)
Creatinine, Ser: 0.56 mg/dL (ref 0.44–1.00)
GFR, Estimated: >60 mL/min (ref >60)
Glucose, Bld: 87 mg/dL (ref 70–99)
Potassium: 3.1 mmol/L — ABNORMAL LOW (ref 3.5–5.1)
Sodium: 141 mmol/L (ref 135–145)

## 2022-01-01 LAB — MAGNESIUM: Magnesium: 1.7 mg/dL (ref 1.7–2.4)

## 2022-01-01 LAB — CBC
HCT: 25.8 % — ABNORMAL LOW (ref 36.0–46.0)
Hemoglobin: 8.1 g/dL — ABNORMAL LOW (ref 12.0–15.0)
MCH: 30.2 pg (ref 26.0–34.0)
MCHC: 31.4 g/dL (ref 30.0–36.0)
MCV: 96.3 fL (ref 80.0–100.0)
Platelets: 205 10*3/uL (ref 150–400)
RBC: 2.68 MIL/uL — ABNORMAL LOW (ref 3.87–5.11)
RDW: 13.4 % (ref 11.5–15.5)
WBC: 3.6 10*3/uL — ABNORMAL LOW (ref 4.0–10.5)
nRBC: 0 % (ref 0.0–0.2)

## 2022-01-01 MED ORDER — MAGNESIUM SULFATE 4 GM/100ML IV SOLN
4.0000 g | Freq: Once | INTRAVENOUS | Status: AC
  Administered 2022-01-01: 4 g via INTRAVENOUS
  Filled 2022-01-01: qty 100

## 2022-01-01 MED ORDER — POTASSIUM CHLORIDE CRYS ER 20 MEQ PO TBCR
40.0000 meq | EXTENDED_RELEASE_TABLET | Freq: Once | ORAL | Status: AC
  Administered 2022-01-01: 40 meq via ORAL
  Filled 2022-01-01: qty 2

## 2022-01-01 MED ORDER — SUCRALFATE 1 GM/10ML PO SUSP
1.0000 g | Freq: Three times a day (TID) | ORAL | Status: DC
  Administered 2022-01-01 – 2022-01-02 (×6): 1 g via ORAL
  Filled 2022-01-01 (×6): qty 10

## 2022-01-01 MED ORDER — NYSTATIN 100000 UNIT/GM EX POWD
Freq: Three times a day (TID) | CUTANEOUS | Status: DC
  Filled 2022-01-01: qty 15

## 2022-01-01 MED ORDER — FLUCONAZOLE 150 MG PO TABS
150.0000 mg | ORAL_TABLET | Freq: Once | ORAL | Status: AC
  Administered 2022-01-01: 150 mg via ORAL
  Filled 2022-01-01: qty 1

## 2022-01-01 NOTE — Progress Notes (Signed)
[name], M.D. Gastroenterology & Hepatology   Interval History:  No acute events overnight. Patient reports feeling well and denies any complaints.  Still had some scant melena in the morning but reported the stool looks elevated more brown than before. No more lightheadedness or dizziness.  No hematochezia, nausea, vomiting.  Tolerated clear liquid diet adequately. Labs from today showed mild decrease of hemoglobin down to 8.1, BUN was low at 5.  Inpatient Medications:  Current Facility-Administered Medications:    0.9 %  sodium chloride infusion, , Intravenous, Continuous, Johnson, Clanford L, MD, Last Rate: 30 mL/hr at 01/01/22 0817, Rate Change at 01/01/22 0817   acetaminophen (TYLENOL) tablet 650 mg, 650 mg, Oral, Q6H PRN, 650 mg at 01/01/22 0511 **OR** acetaminophen (TYLENOL) suppository 650 mg, 650 mg, Rectal, Q6H PRN, Rourk, Cristopher Estimable, MD   albuterol (PROVENTIL) (2.5 MG/3ML) 0.083% nebulizer solution 2.5 mg, 2.5 mg, Nebulization, Q6H PRN, Rourk, Cristopher Estimable, MD   ALPRAZolam Duanne Moron) tablet 0.5 mg, 0.5 mg, Oral, QHS, Rourk, Cristopher Estimable, MD, 0.5 mg at 12/31/21 2049   levothyroxine (SYNTHROID) tablet 125 mcg, 125 mcg, Oral, QAC breakfast, Rourk, Cristopher Estimable, MD, 125 mcg at 01/01/22 0512   ondansetron (ZOFRAN) tablet 4 mg, 4 mg, Oral, Q6H PRN **OR** ondansetron (ZOFRAN) injection 4 mg, 4 mg, Intravenous, Q6H PRN, Daneil Dolin, MD   Derrill Memo ON 01/03/2022] pantoprazole (PROTONIX) injection 40 mg, 40 mg, Intravenous, Q12H, Rourk, Cristopher Estimable, MD   pantoprozole (PROTONIX) 80 mg /NS 100 mL infusion, 8 mg/hr, Intravenous, Continuous, Rourk, Cristopher Estimable, MD, Last Rate: 10 mL/hr at 01/01/22 0352, 8 mg/hr at 01/01/22 0352   sodium chloride flush (NS) 0.9 % injection 3 mL, 3 mL, Intravenous, Q12H, Rourk, Cristopher Estimable, MD, 3 mL at 01/01/22 0817   sucralfate (CARAFATE) 1 GM/10ML suspension 1 g, 1 g, Oral, TID WC & HS, Montez Morita, Shyane Fossum, MD   traMADol Veatrice Bourbon) tablet 50 mg, 50 mg, Oral, Q4H PRN, Daneil Dolin, MD, 50 mg at 12/31/21 1520   I/O    Intake/Output Summary (Last 24 hours) at 01/01/2022 1129 Last data filed at 01/01/2022 0900 Gross per 24 hour  Intake 720 ml  Output --  Net 720 ml     Physical Exam: Temp:  [96.9 F (36.1 C)-98.4 F (36.9 C)] 96.9 F (36.1 C) (11/23 0428) Pulse Rate:  [71-99] 92 (11/23 0428) Resp:  [9-19] 18 (11/23 0428) BP: (114-140)/(58-87) 114/63 (11/23 0428) SpO2:  [96 %-100 %] 96 % (11/23 0428)  Temp (24hrs), Avg:97.6 F (36.4 C), Min:96.9 F (36.1 C), Max:98.4 F (36.9 C)  GENERAL: The patient is AO x3, in no acute distress. HEENT: Head is normocephalic and atraumatic. EOMI are intact. Mouth is well hydrated and without lesions. NECK: Supple. No masses LUNGS: Clear to auscultation. No presence of rhonchi/wheezing/rales. Adequate chest expansion HEART: RRR, normal s1 and s2. ABDOMEN: Soft, nontender, no guarding, no peritoneal signs, and nondistended. BS +. No masses. EXTREMITIES: Without any cyanosis, clubbing, rash, lesions or edema. NEUROLOGIC: AOx3, no focal motor deficit. SKIN: no jaundice, no rashes  Laboratory Data: CBC:     Component Value Date/Time   WBC 3.6 (L) 01/01/2022 0336   RBC 2.68 (L) 01/01/2022 0336   HGB 8.1 (L) 01/01/2022 0336   HCT 25.8 (L) 01/01/2022 0336   PLT 205 01/01/2022 0336   MCV 96.3 01/01/2022 0336   MCH 30.2 01/01/2022 0336   MCHC 31.4 01/01/2022 0336   RDW 13.4 01/01/2022 0336   LYMPHSABS 1.7 07/11/2021 0912  MONOABS 0.4 07/11/2021 0912   EOSABS 0.1 07/11/2021 0912   BASOSABS 0.0 07/11/2021 0912   COAG:  Lab Results  Component Value Date   INR 1.1 01/11/2020    BMP:     Latest Ref Rng & Units 01/01/2022    3:36 AM 12/31/2021    4:48 AM 12/30/2021    4:50 PM  BMP  Glucose 70 - 99 mg/dL 87  93  105   BUN 8 - 23 mg/dL _0 Creatinine 0.44 - 1.00 mg/dL 0.56  0.65  0.79   Sodium 135 - 145 mmol/L 141  143  141   Potassium 3.5 - 5.1 mmol/L 3.1  3.3  3.5   Chloride 98 - 111  mmol/L 113  116  112   CO2 22 - 32 mmol/L _1 Calcium 8.9 - 10.3 mg/dL 8.0  8.0  8.8     HEPATIC:     Latest Ref Rng & Units 12/30/2021    4:50 PM 01/12/2020    4:36 AM 01/11/2020    5:21 AM  Hepatic Function  Total Protein 6.5 - 8.1 g/dL 5.9  4.8  4.8   Albumin 3.5 - 5.0 g/dL 2.9  2.1  2.1   AST 15 - 41 U/L 16  30  39   ALT 0 - 44 U/L 16  32  36   Alk Phosphatase 38 - 126 U/L 170  171  173   Total Bilirubin 0.3 - 1.2 mg/dL 0.2  0.9  1.0     CARDIAC: No results found for: "CKTOTAL", "CKMB", "CKMBINDEX", "TROPONINI"    Imaging: I personally reviewed and interpreted the available labs, imaging and endoscopic files.   Assessment/Plan: [name] is a 61 y.o. 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 The patient had a significant drop in her hemoglobin up to 7.9. Had to receive transfusion of 1 UPRBC with adequate response.  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  2 cc of epinephrine were injected and gold probe was used to ablate the lesion.  After procedure, patient has remained hemodynamically stable and has had very mild decrease in her hemoglobin with very scant melena.  I consider this is likely residual and will improve on its own.  We will slowly advance her diet to a GI soft diet.  She will need to continue with PPI twice daily which she will need to continue until her next esophagogastroduodenospy in 3 months.  Ideally, the patient should be discharged on omeprazole 40  mg twice a day, the patient was advised to open the capsule and swallow the granules.  She will also be started on Carafate for a month.  Etiology of ulceration remains elusive, patient adamantly denies NSAID use and H. Pylori biopsies were negative in the past.  Could be related to surgical ischemia.  Unfortunately, [facility] stopped running H. Pylori serologies. May consider eventual H, pylori stool testing once ulceration has healed  - Repeat CBC qday, transfuse if Hb <7 - Pantoprazole ggt for 72  hours, discharge on omeprazole 40 mg BID open capsule and swallow granules - Carafate solution 1 every 6 hours for one month - 2 large bore IV lines - Active T/S - Advance to GI soft diet - Avoid NSAIDs - Repeat EGD in 3 months - May consider H. pylori stool testing once ulceration has healed  Tracy Peppers, MD [facility]

## 2022-01-01 NOTE — Progress Notes (Signed)
Patient slept on and off throughout shift, she complained of headache this morning, prn tylenol administered. No further complaints of discomfort at this time.

## 2022-01-01 NOTE — Progress Notes (Addendum)
PROGRESS NOTE   [name]  CNO:[id number] DOB: 1961/01/10 DOA: 12/30/2021 PCP: Assunta Found, MD   Chief Complaint  Patient presents with   Rectal Bleeding   Level of care: Telemetry  Brief Admission History:   61 y.o. female with medical history significant of hypertension, hypothyroidism, iron deficiency anemia, hypothyroidism, history of gastric bypass, and depression who presents with complaints of dark bloody stools.  Patient reports having 1 stool yesterday that was formed and another 1 today.  Noted associated symptoms of generalized weakness and fatigue.  Denies having any abdominal pain, nausea, vomiting, or diarrhea symptoms.  She is not on any blood thinners and does not take any NSAIDs.  She had recently had a cold with sinus congestion and headache for which she had been taking DayQuil and NyQuil for several days, but reports those symptoms have improved.  Patient had last been hospitalized in 01/2020 for GI bleed that was thought secondary to Aleve use.  She had required 4 units of packed red blood cells and undergone EGD which revealed 4 mm polypoid nodule adjacent to the anastomosis from prior gastric bypass and a circumferential ulceration of mucosa near the anastomosis that was thought to be the likely source of GI bleed.  She had repeat EGD 4/22 that noted complete healing of the ulcer.   In the emergency department patient was noted to be afebrile with tachycardia, and all other vital signs were noted to be relatively stable.  Labs revealed hemoglobin 9.1 g/dL(hemoglobin 29.4 on 6/2).  Patient has been given 1 L normal saline IV fluids and started on a Protonix drip. Dr. Levon Hedger of GI was consulted.  [facility] called to admit   Assessment and Plan:  Large anastomotic ulceration - seen on EGD 11/22 Dr. Jena Gauss - continue IV protonix infusion as ordered - GI team advanced to soft diet today  Upper GI Bleed - secondary to above - monitoring CBC  - continue IV protonix  infusion  - black stool this morning likely residual from prior bleed  Hypothyroidism  - stable on levothyroxine  GAD - resume home alprazolam  Hypokalemia  - oral replacement given, recheck in AM   History of Roux en Y Gastric Bypass - advancing to soft diet today per GI service   History of PUD/GERD - IV protonix infusion for treatment and GI protection   DVT prophylaxis: SCDs Code Status: Full  Family Communication:    Consultants:  GI service  Procedures:  EGD 12/31/21 Dr. Jena Gauss  Antimicrobials:    Subjective: Pt reports small amount of black stools this morning.       Objective: Vitals:   12/31/21 2052 12/31/21 2350 01/01/22 0428 01/01/22 1300  BP: 117/66 114/62 114/63 129/70  Pulse: 85 71 92 91  Resp: 16 17 18 18   Temp: (!) 96.9 F (36.1 C) 97.8 F (36.6 C) (!) 96.9 F (36.1 C) 99.1 F (37.3 C)  TempSrc:  Oral  Oral  SpO2: 98% 99% 96% 97%  Weight:      Height:        Intake/Output Summary (Last 24 hours) at 01/01/2022 1424 Last data filed at 01/01/2022 0900 Gross per 24 hour  Intake 720 ml  Output --  Net 720 ml   Filed Weights   12/30/21 2120 12/31/21 0453 12/31/21 0940  Weight: 79.8 kg 79.9 kg 79 kg   Examination:  General exam: Appears calm and comfortable  Respiratory system: Clear to auscultation. Respiratory effort normal. Cardiovascular system: normal S1 & S2  heard. No JVD, murmurs, rubs, gallops or clicks. No pedal edema. Gastrointestinal system: Abdomen is nondistended, soft and nontender. No organomegaly or masses felt. Normal bowel sounds heard. Central nervous system: Alert and oriented. No focal neurological deficits. Extremities: Symmetric 5 x 5 power. Skin: No rashes, lesions or ulcers. Psychiatry: Judgement and insight appear normal. Mood & affect appropriate.   Data Reviewed: I have personally reviewed following labs and imaging studies  CBC: Recent Labs  Lab 12/30/21 1650 12/30/21 2020 12/31/21 0448 12/31/21 1153  01/01/22 0336  WBC 5.5  --   --   --  3.6*  HGB 9.1* 8.6* 7.9* 8.7* 8.1*  HCT 28.8* 27.3* 25.1* 27.7* 25.8*  MCV 95.0  --   --   --  96.3  PLT 244  --   --   --  205    Basic Metabolic Panel: Recent Labs  Lab 12/30/21 1650 12/31/21 0448 12/31/21 0500 01/01/22 0336  NA 141 143  --  141  K 3.5 3.3*  --  3.1*  CL 112* 116*  --  113*  CO2 24 25  --  25  GLUCOSE 105* 93  --  87  BUN 22 13  --  5*  CREATININE 0.79 0.65  --  0.56  CALCIUM 8.8* 8.0*  --  8.0*  MG  --   --  1.9 1.7    CBG: No results for input(s): "GLUCAP" in the last 168 hours.  Recent Results (from the past 240 hour(s))  MRSA Next Gen by PCR, Nasal     Status: None   Collection Time: 12/31/21  7:45 AM   Specimen: Nasal Mucosa; Nasal Swab  Result Value Ref Range Status   MRSA by PCR Next Gen NOT DETECTED NOT DETECTED Final    Comment: (NOTE) The GeneXpert MRSA Assay (FDA approved for NASAL specimens only), is one component of a comprehensive MRSA colonization surveillance program. It is not intended to diagnose MRSA infection nor to guide or monitor treatment for MRSA infections. Test performance is not FDA approved in patients less than 47 years old. Performed at [facility], [address], [place], [place] [address]      Radiology Studies: [facility] 1 View  Result Date: 12/30/2021 CLINICAL DATA:  Shortness of breath. EXAM: PORTABLE CHEST 1 VIEW COMPARISON:  Jun 25, 2021. FINDINGS: The heart size and mediastinal contours are within normal limits. Both lungs are clear. The visualized skeletal structures are unremarkable. IMPRESSION: No active disease. Electronically Signed   By: Lupita Raider M.D.   On: 12/30/2021 18:33    Scheduled Meds:  ALPRAZolam  0.5 mg Oral QHS   levothyroxine  125 mcg Oral QAC breakfast   [START ON 01/03/2022] pantoprazole  40 mg Intravenous Q12H   sodium chloride flush  3 mL Intravenous Q12H   sucralfate  1 g Oral TID WC & HS   Continuous Infusions:  sodium chloride  30 mL/hr at 01/01/22 0817   pantoprazole 8 mg/hr (01/01/22 0352)     LOS: 1 day   Time spent: 35 mins  Sheccid Lahmann Laural Benes, MD How to contact the [facility] Attending or Consulting provider 7A - 7P or covering provider during after hours 7P -7A, for this patient?  Check the care team in [facility] and look for a) attending/consulting [facility] provider listed and b) the [facility] team listed Log into [email or web address] and use [place]'s universal password to access. If you do not have the password, please contact the hospital operator. Locate the  [facility] provider you are looking for under [facility] and page to a number that you can be directly reached. If you still have difficulty reaching the provider, please page the [facility] (Director on Call) for the Hospitalists listed on amion for assistance.  01/01/2022, 2:24 PM

## 2022-01-02 LAB — CBC
HCT: 28.3 % — ABNORMAL LOW (ref 36.0–46.0)
Hemoglobin: 8.7 g/dL — ABNORMAL LOW (ref 12.0–15.0)
MCH: 29.9 pg (ref 26.0–34.0)
MCHC: 30.7 g/dL (ref 30.0–36.0)
MCV: 97.3 fL (ref 80.0–100.0)
Platelets: 223 10*3/uL (ref 150–400)
RBC: 2.91 MIL/uL — ABNORMAL LOW (ref 3.87–5.11)
RDW: 13.7 % (ref 11.5–15.5)
WBC: 4.3 10*3/uL (ref 4.0–10.5)
nRBC: 0 % (ref 0.0–0.2)

## 2022-01-02 LAB — BASIC METABOLIC PANEL
Anion gap: 5 (ref 5–15)
BUN: <5 mg/dL — ABNORMAL LOW (ref 8–23)
CO2: 27 mmol/L (ref 22–32)
Calcium: 8.6 mg/dL — ABNORMAL LOW (ref 8.9–10.3)
Chloride: 110 mmol/L (ref 98–111)
Creatinine, Ser: 0.6 mg/dL (ref 0.44–1.00)
GFR, Estimated: >60 mL/min (ref >60)
Glucose, Bld: 88 mg/dL (ref 70–99)
Potassium: 3.5 mmol/L (ref 3.5–5.1)
Sodium: 142 mmol/L (ref 135–145)

## 2022-01-02 MED ORDER — SUCRALFATE 1 G PO TABS: 1.0000 g | ORAL_TABLET | Freq: Four times a day (QID) | ORAL | 0 refills | Status: DC

## 2022-01-02 MED ORDER — OMEPRAZOLE 40 MG PO CPDR: 40.0000 mg | DELAYED_RELEASE_CAPSULE | Freq: Two times a day (BID) | ORAL | 2 refills | Status: DC

## 2022-01-02 MED ORDER — CYANOCOBALAMIN 1000 MCG PO TABS: 3000.0000 ug | ORAL_TABLET | Freq: Every day | ORAL | Status: DC

## 2022-01-02 MED ORDER — FLINTSTONES COMPLETE 18 MG PO CHEW: 2.0000 | CHEWABLE_TABLET | Freq: Every day | ORAL | Status: AC

## 2022-01-02 NOTE — Anesthesia Postprocedure Evaluation (Signed)
Anesthesia Post Note  Patient: [name]  Procedure(s) Performed: ESOPHAGOGASTRODUODENOSCOPY (EGD) WITH PROPOFOL SCLEROTHERAPY  Patient location during evaluation: Phase II Anesthesia Type: General Level of consciousness: awake Pain management: pain level controlled Vital Signs Assessment: post-procedure vital signs reviewed and stable Respiratory status: spontaneous breathing and respiratory function stable Cardiovascular status: blood pressure returned to baseline and stable Postop Assessment: no headache and no apparent nausea or vomiting Anesthetic complications: no Comments: Late entry   No notable events documented.   Last Vitals:  Vitals:   01/01/22 2154 01/02/22 0444  BP: 122/67 (!) 111/57  Pulse: 89 74  Resp: 16 18  Temp: 36.9 C 36.9 C  SpO2: 97% 98%    Last Pain:  Vitals:   01/02/22 0444  TempSrc: Oral  PainSc:                  Windell Norfolk

## 2022-01-02 NOTE — Progress Notes (Signed)
This nurse took patient down to main entrance via wheelchair. Patient family member was waiting for her.

## 2022-01-02 NOTE — Progress Notes (Signed)
Vitals stable and pt slept through night. Pt ambulated independently in room. 0/10 pain reported. Audible bowel sounds in all 4 quadrants. Abdomen soft and non tender.

## 2022-01-02 NOTE — Discharge Instructions (Signed)

## 2022-01-02 NOTE — Discharge Summary (Signed)
Physician Discharge Summary  [name] ZOX:[id number] DOB: 12/24/1960 DOA: 12/30/2021  PCP: Assunta Found, MD GI: [facility]  Admit date: 12/30/2021 Discharge date: 01/02/2022  Admitted From:  HOME  Disposition: HOME   Recommendations for Outpatient Follow-up:  Follow up with PCP in 1-2 weeks Please check CBC in 1-2 weeks Follow up with GI in 3 months for repeat EGD  Discharge Condition: STABLE   CODE STATUS: FULL DIET: resume prior post gastric bypass diet    Brief Hospitalization Summary: Please see all hospital notes, images, labs for full details of the hospitalization. 61 y.o. female with medical history significant of hypertension, hypothyroidism, iron deficiency anemia, hypothyroidism, history of gastric bypass, and depression who presents with complaints of dark bloody stools.  Patient reports having 1 stool yesterday that was formed and another 1 today.  Noted associated symptoms of generalized weakness and fatigue.  Denies having any abdominal pain, nausea, vomiting, or diarrhea symptoms.  She is not on any blood thinners and does not take any NSAIDs.  She had recently had a cold with sinus congestion and headache for which she had been taking DayQuil and NyQuil for several days, but reports those symptoms have improved.  Patient had last been hospitalized in 01/2020 for GI bleed that was thought secondary to Aleve use.  She had required 4 units of packed red blood cells and undergone EGD which revealed 4 mm polypoid nodule adjacent to the anastomosis from prior gastric bypass and a circumferential ulceration of mucosa near the anastomosis that was thought to be the likely source of GI bleed.  She had repeat EGD 4/22 that noted complete healing of the ulcer.   In the emergency department patient was noted to be afebrile with tachycardia, and all other vital signs were noted to be relatively stable.  Labs revealed hemoglobin 9.1 g/dL(hemoglobin 81.1 on 6/2).  Patient has been  given 1 L normal saline IV fluids and started on a Protonix drip. Dr. Levon Hedger of GI was consulted.  [facility] called to admit   Assessment and Plan:   Large anastomotic ulceration - seen on EGD 11/22 Dr. Jena Gauss - continue IV protonix infusion x 72 hours, then omeprazole 40 mg BID open capsule and swallow granules - GI team planning followup in 3 months for repeat EGD - Carafate QID x 1 month per GI service    Upper GI Bleed - secondary to above - monitoring CBC - stable  - s/p IV protonix infusion x 72 hours   Hypothyroidism  - stable on levothyroxine   GAD - resume home alprazolam   Hypokalemia  - oral replacement given and repleted     History of Roux en Y Gastric Bypass - advancing to soft diet today per GI service    History of PUD/GERD - IV protonix infusion for treatment and GI protection then home on omeprazole 40 mg BID open capsule and swallow granules - carafate QID x 1 month     DVT prophylaxis: SCDs Code Status: Full  Family Communication:    Consultants:  GI service  Procedures:  EGD 12/31/21 Dr. Jena Gauss  Discharge Diagnoses:  Principal Problem:   Acute GI bleeding Active Problems:   Hypothyroidism   Generalized anxiety disorder   S/P gastric bypass   Gastrojejunal ulcer   History of peptic ulcer disease   Acute blood loss anemia   Bleeding ulcer   Melena   Discharge Instructions:  Allergies as of 01/02/2022       Reactions  Bee Pollen Anaphylaxis   Bee Venom Anaphylaxis, Itching   Swelling and itching   Hydrocodone-acetaminophen Itching   Chocolate Other (See Comments)   Reaction: causes facial blistering,inside mouth   Nsaids Nausea And Vomiting   Synthroid [levothyroxine Sodium] Itching, Swelling   Patient has a reaction to brand name synthroid only   Latex Rash   Neosporin [neomycin-bacitracin Zn-polymyx] Rash        Medication List     STOP taking these medications    pantoprazole 40 MG tablet Commonly known as: PROTONIX        TAKE these medications    ALPRAZolam 0.5 MG tablet Commonly known as: XANAX Take 0.5 mg by mouth at bedtime.   ascorbic acid 500 MG tablet Commonly known as: VITAMIN C Take 500 mg by mouth daily.   Biotin 5 MG Tabs Take 5 mg by mouth daily.   bismuth subsalicylate 262 MG/15ML suspension Commonly known as: PEPTO BISMOL Take 30 mLs by mouth every 6 (six) hours as needed for diarrhea or loose stools or indigestion.   cyanocobalamin 1000 MCG tablet Take 3 tablets (3,000 mcg total) by mouth daily.   diphenhydrAMINE 25 mg capsule Commonly known as: BENADRYL Take 50 mg by mouth in the morning and at bedtime.   Flintstones Complete 18 MG Chew Chew 2 tablets by mouth daily. With iron   levothyroxine 125 MCG tablet Commonly known as: SYNTHROID Take 125 mcg by mouth daily before breakfast.   omeprazole 40 MG capsule Commonly known as: PRILOSEC Take 1 capsule (40 mg total) by mouth in the morning and at bedtime. Open capsule and swallow granules   promethazine 25 MG tablet Commonly known as: PHENERGAN Take 25 mg by mouth every 6 (six) hours as needed for vomiting or nausea.   sucralfate 1 g tablet Commonly known as: Carafate Take 1 tablet (1 g total) by mouth 4 (four) times daily.   traMADol 50 MG tablet Commonly known as: ULTRAM Take 50 mg by mouth every 4 (four) hours as needed for pain.        Follow-up Information     [facility]. Schedule an appointment as soon as possible for a visit in 3 month(s).   Why: Hospital Follow Up Contact information: [address] [place] [id number] [phone]        Assunta FoundGolding, John, MD. Schedule an appointment as soon as possible for a visit in 2 week(s).   Specialty: Family Medicine Why: Hospital Follow Up Contact information: [address] [place] [id number] [phone]                Allergies  Allergen Reactions   Bee Pollen Anaphylaxis   Bee  Venom Anaphylaxis and Itching    Swelling and itching   Hydrocodone-Acetaminophen Itching   Chocolate Other (See Comments)    Reaction: causes facial blistering,inside mouth   Nsaids Nausea And Vomiting   Synthroid [Levothyroxine Sodium] Itching and Swelling    Patient has a reaction to brand name synthroid only   Latex Rash   Neosporin [Neomycin-Bacitracin Zn-Polymyx] Rash   Allergies as of 01/02/2022       Reactions   Bee Pollen Anaphylaxis   Bee Venom Anaphylaxis, Itching   Swelling and itching   Hydrocodone-acetaminophen Itching   Chocolate Other (See Comments)   Reaction: causes facial blistering,inside mouth   Nsaids Nausea And Vomiting   Synthroid [levothyroxine Sodium] Itching, Swelling   Patient has a reaction to brand name synthroid only   Latex  Rash   Neosporin [neomycin-bacitracin Zn-polymyx] Rash        Medication List     STOP taking these medications    pantoprazole 40 MG tablet Commonly known as: PROTONIX       TAKE these medications    ALPRAZolam 0.5 MG tablet Commonly known as: XANAX Take 0.5 mg by mouth at bedtime.   ascorbic acid 500 MG tablet Commonly known as: VITAMIN C Take 500 mg by mouth daily.   Biotin 5 MG Tabs Take 5 mg by mouth daily.   bismuth subsalicylate 262 MG/15ML suspension Commonly known as: PEPTO BISMOL Take 30 mLs by mouth every 6 (six) hours as needed for diarrhea or loose stools or indigestion.   cyanocobalamin 1000 MCG tablet Take 3 tablets (3,000 mcg total) by mouth daily.   diphenhydrAMINE 25 mg capsule Commonly known as: BENADRYL Take 50 mg by mouth in the morning and at bedtime.   Flintstones Complete 18 MG Chew Chew 2 tablets by mouth daily. With iron   levothyroxine 125 MCG tablet Commonly known as: SYNTHROID Take 125 mcg by mouth daily before breakfast.   omeprazole 40 MG capsule Commonly known as: PRILOSEC Take 1 capsule (40 mg total) by mouth in the morning and at bedtime. Open capsule and  swallow granules   promethazine 25 MG tablet Commonly known as: PHENERGAN Take 25 mg by mouth every 6 (six) hours as needed for vomiting or nausea.   sucralfate 1 g tablet Commonly known as: Carafate Take 1 tablet (1 g total) by mouth 4 (four) times daily.   traMADol 50 MG tablet Commonly known as: ULTRAM Take 50 mg by mouth every 4 (four) hours as needed for pain.        Procedures/Studies: DG Chest Port 1 View  Result Date: 12/30/2021 CLINICAL DATA:  Shortness of breath. EXAM: PORTABLE CHEST 1 VIEW COMPARISON:  Jun 25, 2021. FINDINGS: The heart size and mediastinal contours are within normal limits. Both lungs are clear. The visualized skeletal structures are unremarkable. IMPRESSION: No active disease. Electronically Signed   By: Lupita Raider M.D.   On: 12/30/2021 18:33     Subjective: Pt reports that she is feeling great, really wanting to get home.    Discharge Exam: Vitals:   01/01/22 2154 01/02/22 0444  BP: 122/67 (!) 111/57  Pulse: 89 74  Resp: 16 18  Temp: 98.5 F (36.9 C) 98.4 F (36.9 C)  SpO2: 97% 98%   Vitals:   01/01/22 0428 01/01/22 1300 01/01/22 2154 01/02/22 0444  BP: 114/63 129/70 122/67 (!) 111/57  Pulse: 92 91 89 74  Resp: Temp: (!) 96.9 F (36.1 C) 99.1 F (37.3 C) 98.5 F (36.9 C) 98.4 F (36.9 C)  TempSrc:  Oral Oral Oral  SpO2: 96% 97% 97% 98%  Weight:      Height:       General: Pt is alert, awake, not in acute distress Cardiovascular: normal S1/S2 +, no rubs, no gallops Respiratory: CTA bilaterally, no wheezing, no rhonchi Abdominal: Soft, NT, ND, bowel sounds + Extremities: no edema, no cyanosis   The results of significant diagnostics from this hospitalization (including imaging, microbiology, ancillary and laboratory) are listed below for reference.     Microbiology: Recent Results (from the past 240 hour(s))  MRSA Next Gen by PCR, Nasal     Status: None   Collection Time: 12/31/21  7:45 AM   Specimen:  Nasal Mucosa; Nasal Swab  Result Value Ref Range  Status   MRSA by PCR Next Gen NOT DETECTED NOT DETECTED Final    Comment: (NOTE) The GeneXpert MRSA Assay (FDA approved for NASAL specimens only), is one component of a comprehensive MRSA colonization surveillance program. It is not intended to diagnose MRSA infection nor to guide or monitor treatment for MRSA infections. Test performance is not FDA approved in patients less than 50 years old. Performed at [facility], [address], [place], [place] [address]      Labs: BNP (last 3 results) No results for input(s): "BNP" in the last 8760 hours. Basic Metabolic Panel: Recent Labs  Lab 12/30/21 1650 12/31/21 0448 12/31/21 0500 01/01/22 0336 01/02/22 0328  NA 141 143  --  141 142  K 3.5 3.3*  --  3.1* 3.5  CL 112* 116*  --  113* 110  CO2 24 25  --  25 27  GLUCOSE 105* 93  --  87 88  BUN 22 13  --  5* <5*  CREATININE 0.79 0.65  --  0.56 0.60  CALCIUM 8.8* 8.0*  --  8.0* 8.6*  MG  --   --  1.9 1.7  --    Liver Function Tests: Recent Labs  Lab 12/30/21 1650  AST 16  ALT 16  ALKPHOS 170*  BILITOT 0.2*  PROT 5.9*  ALBUMIN 2.9*   No results for input(s): "LIPASE", "AMYLASE" in the last 168 hours. No results for input(s): "AMMONIA" in the last 168 hours. CBC: Recent Labs  Lab 12/30/21 1650 12/30/21 2020 12/31/21 0448 12/31/21 1153 01/01/22 0336 01/02/22 0328  WBC 5.5  --   --   --  3.6* 4.3  HGB 9.1* 8.6* 7.9* 8.7* 8.1* 8.7*  HCT 28.8* 27.3* 25.1* 27.7* 25.8* 28.3*  MCV 95.0  --   --   --  96.3 97.3  PLT 244  --   --   --  205 223   Cardiac Enzymes: No results for input(s): "CKTOTAL", "CKMB", "CKMBINDEX", "TROPONINI" in the last 168 hours. BNP: Invalid input(s): "POCBNP" CBG: No results for input(s): "GLUCAP" in the last 168 hours. D-Dimer No results for input(s): "DDIMER" in the last 72 hours. Hgb A1c No results for input(s): "HGBA1C" in the last 72 hours. Lipid Profile No results for input(s): "CHOL",  "HDL", "LDLCALC", "TRIG", "CHOLHDL", "LDLDIRECT" in the last 72 hours. Thyroid function studies No results for input(s): "TSH", "T4TOTAL", "T3FREE", "THYROIDAB" in the last 72 hours.  Invalid input(s): "FREET3" Anemia work up No results for input(s): "VITAMINB12", "FOLATE", "FERRITIN", "TIBC", "IRON", "RETICCTPCT" in the last 72 hours. Urinalysis    Component Value Date/Time   COLORURINE YELLOW 08/11/2011 0855   APPEARANCEUR CLEAR 08/11/2011 0855   LABSPEC 1.010 08/11/2011 0855   PHURINE 5.5 08/11/2011 0855   GLUCOSEU NEGATIVE 08/11/2011 0855   HGBUR TRACE (A) 08/11/2011 0855   BILIRUBINUR NEGATIVE 08/11/2011 0855   KETONESUR NEGATIVE 08/11/2011 0855   PROTEINUR NEGATIVE 08/11/2011 0855   UROBILINOGEN 0.2 08/11/2011 0855   NITRITE NEGATIVE 08/11/2011 0855   LEUKOCYTESUR MODERATE (A) 08/11/2011 0855   Sepsis Labs Recent Labs  Lab 12/30/21 1650 01/01/22 0336 01/02/22 0328  WBC 5.5 3.6* 4.3   Microbiology Recent Results (from the past 240 hour(s))  MRSA Next Gen by PCR, Nasal     Status: None   Collection Time: 12/31/21  7:45 AM   Specimen: Nasal Mucosa; Nasal Swab  Result Value Ref Range Status   MRSA by PCR Next Gen NOT DETECTED NOT DETECTED Final    Comment: (NOTE)  The GeneXpert MRSA Assay (FDA approved for NASAL specimens only), is one component of a comprehensive MRSA colonization surveillance program. It is not intended to diagnose MRSA infection nor to guide or monitor treatment for MRSA infections. Test performance is not FDA approved in patients less than 92 years old. Performed at [facility], [address] [place], [place] [address]    Time coordinating discharge: 42 mins   SIGNED:  Standley Dakins, MD  [facility] 01/02/2022, 1:20 PM How to contact the [facility] Attending or Consulting provider 7A - 7P or covering provider during after hours 7P -7A, for this patient?  Check the care team in [facility] and look for a) attending/consulting [facility] provider  listed and b) the [facility] team listed Log into [email or web address] and use [place]'s universal password to access. If you do not have the password, please contact the hospital operator. Locate the [facility] provider you are looking for under [facility] and page to a number that you can be directly reached. If you still have difficulty reaching the provider, please page the [facility] (Director on Call) for the Hospitalists listed on amion for assistance.

## 2022-01-02 NOTE — Progress Notes (Signed)
Patient a/ox4. Patient vitals are stable. Patient given discharge papers. Discharge instructions understood. Patient Ivs removed. Patient L lower forearm started to have slight swelling and tender to touch once IV was removed. Patient given an ice pack to place on area.

## 2022-01-02 NOTE — Progress Notes (Signed)
[name], M.D. Gastroenterology & Hepatology   Interval History:  No acute events overnight. Patient reports feeling well and states that she has not presented any bowel movement since yesterday.  Has tolerated diet adequately without abdominal pain.  States that she vomited once yesterday after receiving "a nausea pill" but did not vomit any food. Hemoglobin today remains stable at 8.7 with BUN of less than 5.  Inpatient Medications:  Current Facility-Administered Medications:    0.9 %  sodium chloride infusion, , Intravenous, Continuous, Johnson, Clanford L, MD, Last Rate: 30 mL/hr at 01/01/22 0817, Rate Change at 01/01/22 0817   acetaminophen (TYLENOL) tablet 650 mg, 650 mg, Oral, Q6H PRN, 650 mg at 01/01/22 0511 **OR** acetaminophen (TYLENOL) suppository 650 mg, 650 mg, Rectal, Q6H PRN, Rourk, Cristopher Estimable, MD   albuterol (PROVENTIL) (2.5 MG/3ML) 0.083% nebulizer solution 2.5 mg, 2.5 mg, Nebulization, Q6H PRN, Rourk, Cristopher Estimable, MD   ALPRAZolam Duanne Moron) tablet 0.5 mg, 0.5 mg, Oral, QHS, Rourk, Cristopher Estimable, MD, 0.5 mg at 01/01/22 2213   levothyroxine (SYNTHROID) tablet 125 mcg, 125 mcg, Oral, QAC breakfast, Rourk, Cristopher Estimable, MD, 125 mcg at 01/02/22 5465   nystatin (MYCOSTATIN/NYSTOP) topical powder, , Topical, TID, Wynetta Emery, Clanford L, MD, Given at 01/01/22 2213   ondansetron (ZOFRAN) tablet 4 mg, 4 mg, Oral, Q6H PRN **OR** ondansetron (ZOFRAN) injection 4 mg, 4 mg, Intravenous, Q6H PRN, Daneil Dolin, MD, 4 mg at 01/01/22 1843   [START ON 01/03/2022] pantoprazole (PROTONIX) injection 40 mg, 40 mg, Intravenous, Q12H, Rourk, Cristopher Estimable, MD   pantoprozole (PROTONIX) 80 mg /NS 100 mL infusion, 8 mg/hr, Intravenous, Continuous, Rourk, Cristopher Estimable, MD, Last Rate: 10 mL/hr at 01/01/22 0352, 8 mg/hr at 01/01/22 0352   sodium chloride flush (NS) 0.9 % injection 3 mL, 3 mL, Intravenous, Q12H, Rourk, Cristopher Estimable, MD, 3 mL at 01/01/22 2214   sucralfate (CARAFATE) 1 GM/10ML suspension 1 g, 1 g, Oral, TID WC &  HS, Montez Morita, Aries Kasa, MD, 1 g at 01/01/22 2213   traMADol (ULTRAM) tablet 50 mg, 50 mg, Oral, Q4H PRN, Daneil Dolin, MD, 50 mg at 12/31/21 1520   I/O    Intake/Output Summary (Last 24 hours) at 01/02/2022 0914 Last data filed at 01/01/2022 1600 Gross per 24 hour  Intake 1648.87 ml  Output --  Net 1648.87 ml     Physical Exam: Temp:  [98.4 F (36.9 C)-99.1 F (37.3 C)] 98.4 F (36.9 C) (11/24 0444) Pulse Rate:  [74-91] 74 (11/24 0444) Resp:  [16-18] 18 (11/24 0444) BP: (111-129)/(57-70) 111/57 (11/24 0444) SpO2:  [97 %-98 %] 98 % (11/24 0444)  Temp (24hrs), Avg:98.7 F (37.1 C), Min:98.4 F (36.9 C), Max:99.1 F (37.3 C) GENERAL: The patient is AO x3, in no acute distress. HEENT: Head is normocephalic and atraumatic. EOMI are intact. Mouth is well hydrated and without lesions. NECK: Supple. No masses LUNGS: Clear to auscultation. No presence of rhonchi/wheezing/rales. Adequate chest expansion HEART: RRR, normal s1 and s2. ABDOMEN: Soft, nontender, no guarding, no peritoneal signs, and nondistended. BS +. No masses. EXTREMITIES: Without any cyanosis, clubbing, rash, lesions or edema. NEUROLOGIC: AOx3, no focal motor deficit. SKIN: no jaundice, no rashes  Laboratory Data: CBC:     Component Value Date/Time   WBC 4.3 01/02/2022 0328   RBC 2.91 (L) 01/02/2022 0328   HGB 8.7 (L) 01/02/2022 0328   HCT 28.3 (L) 01/02/2022 0328   PLT 223 01/02/2022 0328   MCV 97.3 01/02/2022 0328   MCH 29.9 01/02/2022 0328  MCHC 30.7 01/02/2022 0328   RDW 13.7 01/02/2022 0328   LYMPHSABS 1.7 07/11/2021 0912   MONOABS 0.4 07/11/2021 0912   EOSABS 0.1 07/11/2021 0912   BASOSABS 0.0 07/11/2021 0912   COAG:  Lab Results  Component Value Date   INR 1.1 01/11/2020    BMP:     Latest Ref Rng & Units 01/02/2022    3:28 AM 01/01/2022    3:36 AM 12/31/2021    4:48 AM  BMP  Glucose 70 - 99 mg/dL 88  87  93   BUN 8 - 23 mg/dL <_0 Creatinine 0.44 - 1.00 mg/dL 0.60   0.56  0.65   Sodium 135 - 145 mmol/L 142  141  143   Potassium 3.5 - 5.1 mmol/L 3.5  3.1  3.3   Chloride 98 - 111 mmol/L 110  113  116   CO2 22 - 32 mmol/L _1 Calcium 8.9 - 10.3 mg/dL 8.6  8.0  8.0     HEPATIC:     Latest Ref Rng & Units 12/30/2021    4:50 PM 01/12/2020    4:36 AM 01/11/2020    5:21 AM  Hepatic Function  Total Protein 6.5 - 8.1 g/dL 5.9  4.8  4.8   Albumin 3.5 - 5.0 g/dL 2.9  2.1  2.1   AST 15 - 41 U/L 16  30  39   ALT 0 - 44 U/L 16  32  36   Alk Phosphatase 38 - 126 U/L 170  171  173   Total Bilirubin 0.3 - 1.2 mg/dL 0.2  0.9  1.0     CARDIAC: No results found for: "CKTOTAL", "CKMB", "CKMBINDEX", "TROPONINI"    Imaging: I personally reviewed and interpreted the available labs, imaging and endoscopic files.   Assessment/Plan: BABARA Rush is a 61 y.o. 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 The patient had a significant drop in her hemoglobin up to 7.9. Had to receive transfusion of 1 UPRBC with adequate response.  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  2 cc of epinephrine were injected and gold probe was used to ablate the lesion.   After procedure, patient has remained hemodynamically stable and has had stable hemoglobin without requirement of further transfusions.  Has tolerated GI soft diet.  She will need to continue with PPI twice daily which she will need to continue until her next esophagogastroduodenospy in 3 months.  Ideally, the patient  should be discharged on omeprazole 40 mg twice a day, the patient was advised to open the capsule and swallow the granules.  Will need to take liquid Carafate 1 g every 6 hours for 1 month.   Etiology of ulceration remains elusive, patient adamantly denies NSAID use and H. Pylori biopsies were negative in the past.  Could be related to surgical ischemia.  Unfortunately, [facility] stopped running H. Pylori serologies. May consider eventual H, pylori stool testing once ulceration has healed   - Repeat CBC qday, transfuse if Hb <7 - Pantoprazole  ggt for 72 hours, discharge on omeprazole 40 mg BID open capsule and swallow granules - Carafate solution 1 every 6 hours for one month - 2 large bore IV lines - Active T/S - GI soft diet - Avoid NSAIDs - Repeat EGD in 3 months - May consider H. pylori stool testing once ulceration has healed - GI service will sign-off, please call us back if you have any more questions.  Tracy Peppers, MD Gastroenterology and Hepatology [facility]

## 2022-01-04 IMAGING — DX DG CHEST 1V PORT
1 series · 1 of 1 positions shown · non-contrast
Comparison: Chest radiograph April 29, 2011

CLINICAL DATA: Weakness and tachycardia.

EXAM:
PORTABLE CHEST 1 VIEW

[chest ap]
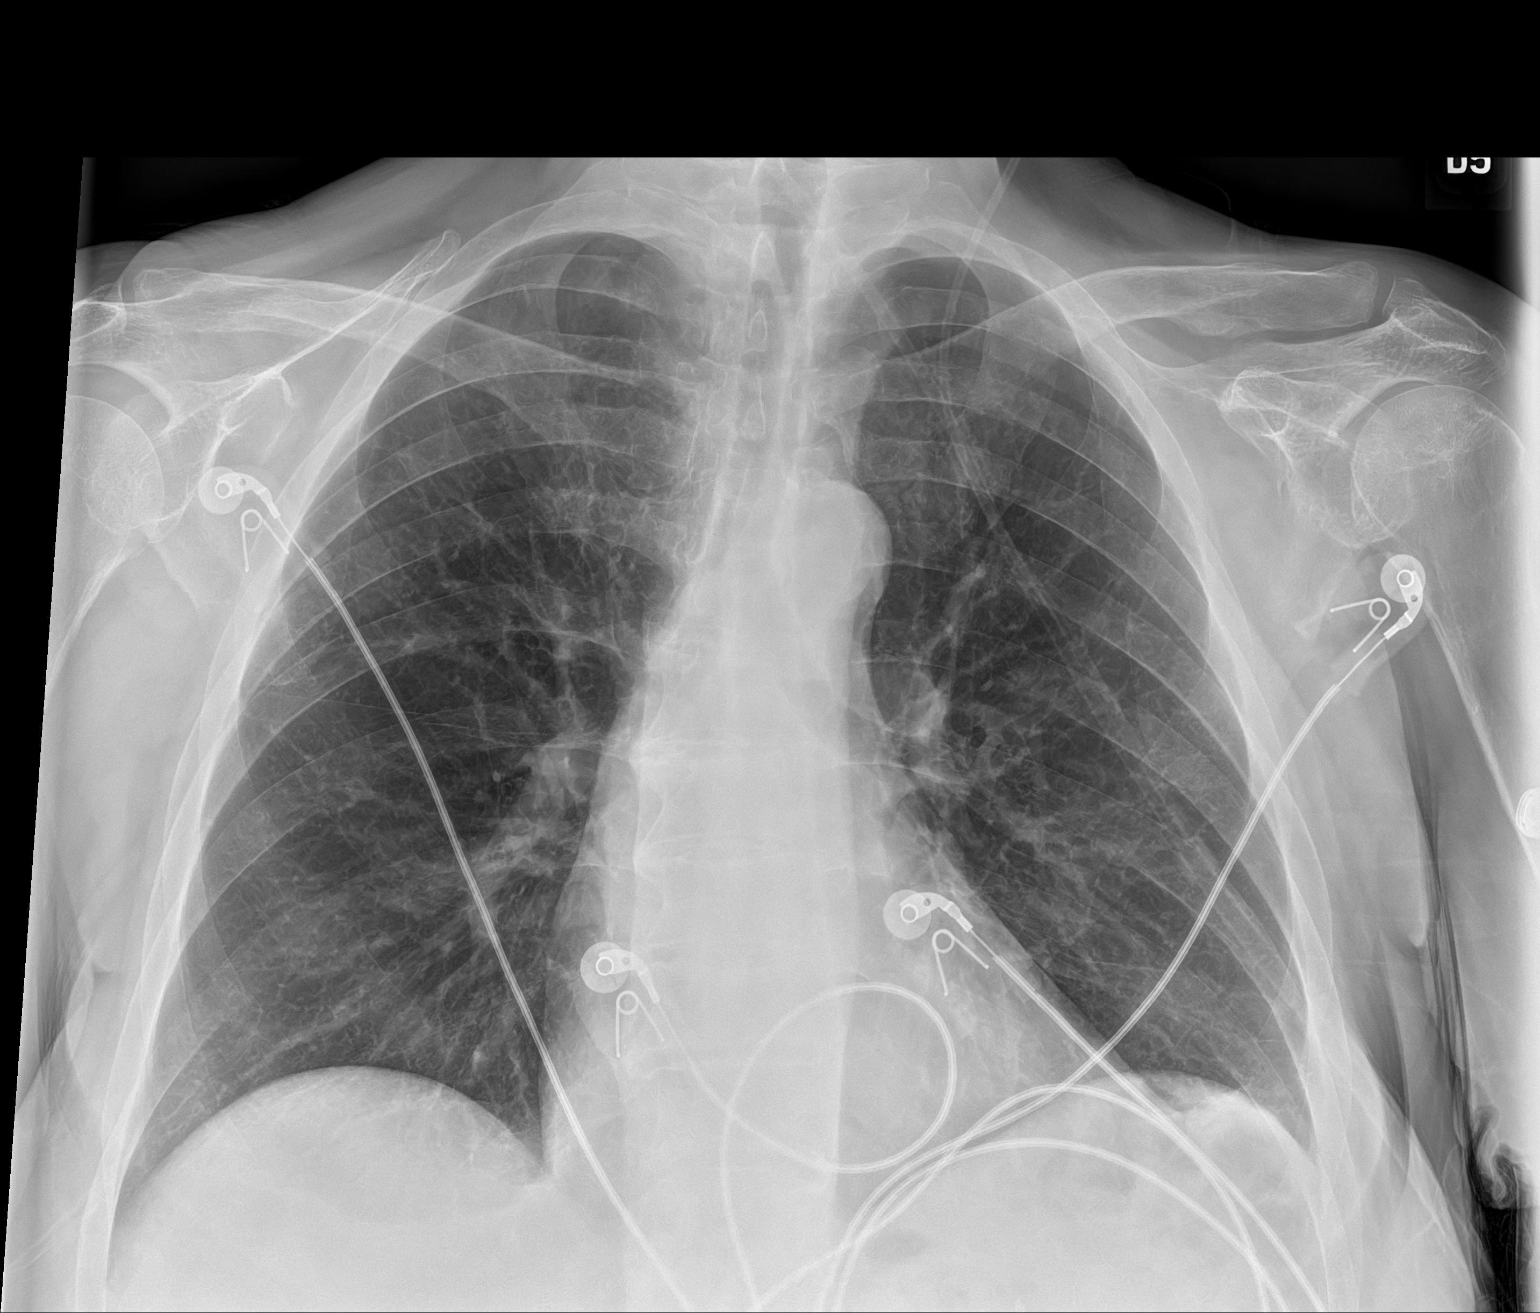

[1 of 1 positions shown; findings below may reference images not displayed]

FINDINGS: The heart size and mediastinal contours are within normal limits.
Chronic parenchymal lung changes without focal consolidation. No
visible pneumothorax. No pleural effusion. Thoracic spondylosis.
IMPRESSION: No acute cardiopulmonary process.

## 2022-01-05 ENCOUNTER — Telehealth (INDEPENDENT_AMBULATORY_CARE_PROVIDER_SITE_OTHER): Payer: Self-pay | Admitting: *Deleted

## 2022-01-05 NOTE — Telephone Encounter (Signed)
-----   Message from Dolores Frame, MD sent at 01/02/2022  9:54 AM EST ----- Hi Cheick Suhr/Tammy,   Can you please schedule an EGD in 3 months? Dx: gastrojejunal ulcer. Room: 3  Thanks,  Katrinka Blazing, MD Gastroenterology and Hepatology [facility]astroenter[facility]

## 2022-01-05 NOTE — Telephone Encounter (Signed)
Sent to [place] to add to recall list

## 2022-01-05 NOTE — Telephone Encounter (Signed)
3 mth EGD noted in recall 

## 2022-01-08 ENCOUNTER — Encounter (HOSPITAL_COMMUNITY): Payer: Self-pay | Admitting: Internal Medicine

## 2022-01-09 LAB — HEPATIC FUNCTION PANEL
ALT: 13 IU/L (ref 0–32)
AST: 14 IU/L (ref 0–40)
Albumin: 3.1 g/dL — ABNORMAL LOW (ref 3.9–4.9)
Alkaline Phosphatase: 221 IU/L — ABNORMAL HIGH (ref 44–121)
Bilirubin Total: 0.2 mg/dL (ref 0.0–1.2)
Bilirubin, Direct: <0.1 mg/dL (ref 0.00–0.40)
Total Protein: 5.6 g/dL — ABNORMAL LOW (ref 6.0–8.5)

## 2022-01-19 ENCOUNTER — Encounter (INDEPENDENT_AMBULATORY_CARE_PROVIDER_SITE_OTHER): Payer: Self-pay | Admitting: Gastroenterology

## 2022-01-26 ENCOUNTER — Ambulatory Visit (INDEPENDENT_AMBULATORY_CARE_PROVIDER_SITE_OTHER): Admitting: Gastroenterology

## 2022-01-28 ENCOUNTER — Ambulatory Visit: Admitting: Gastroenterology

## 2022-02-24 ENCOUNTER — Encounter (INDEPENDENT_AMBULATORY_CARE_PROVIDER_SITE_OTHER): Payer: Self-pay | Admitting: *Deleted

## 2022-07-09 ENCOUNTER — Encounter (INDEPENDENT_AMBULATORY_CARE_PROVIDER_SITE_OTHER): Payer: Self-pay | Admitting: Gastroenterology

## 2022-07-09 ENCOUNTER — Encounter (HOSPITAL_COMMUNITY): Payer: Self-pay | Admitting: Hematology

## 2022-07-09 ENCOUNTER — Telehealth (INDEPENDENT_AMBULATORY_CARE_PROVIDER_SITE_OTHER): Payer: Self-pay | Admitting: Gastroenterology

## 2022-07-09 ENCOUNTER — Ambulatory Visit (INDEPENDENT_AMBULATORY_CARE_PROVIDER_SITE_OTHER): Admitting: Gastroenterology

## 2022-07-09 VITALS — BP 139/70 | HR 79 | Temp 98.4°F | Ht 70.0 in | Wt 166.3 lb

## 2022-07-09 DIAGNOSIS — R6881 Early satiety: Secondary | ICD-10-CM | POA: Insufficient documentation

## 2022-07-09 DIAGNOSIS — R1013 Epigastric pain: Secondary | ICD-10-CM | POA: Insufficient documentation

## 2022-07-09 DIAGNOSIS — R112 Nausea with vomiting, unspecified: Secondary | ICD-10-CM

## 2022-07-09 DIAGNOSIS — Z8711 Personal history of peptic ulcer disease: Secondary | ICD-10-CM | POA: Diagnosis not present

## 2022-07-09 MED ORDER — SUCRALFATE 1 GM/10ML PO SUSP: 1.0000 g | Freq: Three times a day (TID) | ORAL | 1 refills | Status: DC

## 2022-07-09 MED ORDER — OMEPRAZOLE 40 MG PO CPDR: 40.0000 mg | DELAYED_RELEASE_CAPSULE | Freq: Every day | ORAL | 1 refills | Status: DC

## 2022-07-09 NOTE — Telephone Encounter (Signed)
Patient is aware [employer] is showing inactive - advised patient to check on insurance

## 2022-07-09 NOTE — Progress Notes (Addendum)
Referring Provider: Assunta Found, MD Primary Care Physician:  Assunta Found, MD Primary GI Physician: Dr. Levon Hedger   Chief Complaint  Patient presents with   Follow-up    Patient here today for a follow up on her history of bleeding ulcer. Patient had a egd on 12/31/2021. Patient has been having issues with nausea and vomiting mostly everyday after eating.  Has no abdominal pain, nor any burning. Patient is no longer taking omeprazole 40 mg bid. Patient does take pepto prn. Patient does says she has some phenergan on hand and uses it once to twice daily. Has no appetite.   HPI:   [name] is a 62 y.o. 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  Last seen in hospital during admission in November 2023, at that time presenting with melena, fatigue, anemia, heme positive stool. She underwent EGD with findings as below. Was to have repeat EGD in 12 weeks but was lost to follow up for this.   Present:  She notes that she started feeling like she is not hungry and losing some weight over the past month or so. Down about 9 pounds since November. Having a lot of nausea and some vomiting, usually daily, sometimes feels that she eats "too much" when this occurs though she is not even eating a large volume of food. She notes early satiety. She reports epigastric pain after she eats. No blood in stools or melena. Not taking any NSAIDs. She is not taking omeprazole. She takes pepto bismol maybe once or twice a day as this helps with her nausea some. Notes nausea is relieved after vomiting. She had to cancel previous repeat EGD due to her work schedule.   Last Colonoscopy:2020 report unavailable  Last Endoscopy: Surgically altered stomach consistent with prior   history of RYGB. Stenotic anastomosis with extensive anastomotic ulceration and visible vessel.  -Status post endoscopic bleeding control therapy as described above.-I suspect surreptitious NSAID use. H. pylori negative by histology 2022. Will double check negative status with serologies. Normal-appearing ED apparently - Normal esophagus. - No specimens collected.  Recommendations:    Past Medical History:  Diagnosis Date   Depression    started Lexapro when husband passed away   Hypertension    Hypothyroidism    IBS (irritable bowel syndrome)    Iron deficiency anemia 04/11/2021   PONV (postoperative nausea and vomiting)     Past Surgical History:  Procedure Laterality Date   ABDOMINAL HYSTERECTOMY  08/11/2011   Procedure: HYSTERECTOMY ABDOMINAL;  Surgeon: [name], MD;  Location: [facility];  Service: Gynecology;  Laterality: N/A;   BIOPSY  01/11/2020   Procedure: BIOPSY;  Surgeon: Corbin Ade, MD;  [facility]: [facility];  Service: Endoscopy;;   CESAREAN SECTION     x 2   CHOLECYSTECTOMY  2009   [facility]   ESOPHAGOGASTRODUODENOSCOPY  Jan 2004   RMR: normal esophagus, small hiatal hernia, normal D1, D2   ESOPHAGOGASTRODUODENOSCOPY (EGD) WITH PROPOFOL N/A 01/11/2020   Procedure: ESOPHAGOGASTRODUODENOSCOPY (EGD) WITH PROPOFOL;  Surgeon: Corbin Ade, MD;  [facility];  Service: Endoscopy;  Laterality: N/A;   ESOPHAGOGASTRODUODENOSCOPY (EGD) WITH PROPOFOL N/A 06/05/2020   Procedure: ESOPHAGOGASTRODUODENOSCOPY (EGD) WITH PROPOFOL;  Surgeon: Malissa Hippo, MD;  [facility];  Service: Endoscopy;  Laterality: N/A;  am   ESOPHAGOGASTRODUODENOSCOPY (EGD) WITH PROPOFOL N/A 12/31/2021   Procedure: ESOPHAGOGASTRODUODENOSCOPY (EGD) WITH PROPOFOL;  Surgeon: Corbin Ade, MD;  [facility]: [facility];  Service: Endoscopy;  Laterality: N/A;   FOOT SURGERY     x 5 right   GASTRIC BYPASS     gastric stent  2020   HAND SURGERY     x2 right   HEMATOMA  EVACUATION  08/14/2011   Procedure: EVACUATION HEMATOMA;  Surgeon: [name], MD;  Location: [facility];  Service: Gynecology;  Laterality: N/A;   SALPINGOOPHORECTOMY  08/11/2011   Procedure: SALPINGO OOPHERECTOMY;  Surgeon: [name], MD;  Location: [facility];  Service: Gynecology;  Laterality: Bilateral;   SCAR REVISION  08/11/2011   Procedure: SCAR REVISION;  Surgeon: [name], MD;  Location: [facility];  Service: Gynecology;  Laterality: N/A;  Excision of Wide Cicatrix   SCLEROTHERAPY N/A 12/31/2021   Procedure: SCLEROTHERAPY;  Surgeon: Corbin Ade, MD;  Location: [facility];  Service: Endoscopy;  Laterality: N/A;    Current Outpatient Medications  Medication Sig Dispense Refill   ALPRAZolam (XANAX) 0.5 MG tablet Take 0.5 mg by mouth at bedtime.     bismuth subsalicylate (PEPTO BISMOL) 262 MG/15ML suspension Take 30 mLs by mouth every 6 (six) hours as needed for diarrhea or loose stools or indigestion.     diphenhydrAMINE (BENADRYL) 25 mg capsule Take 50 mg by mouth in the morning and at bedtime.     levothyroxine (SYNTHROID, LEVOTHROID) 125 MCG tablet Take 125 mcg by mouth daily before breakfast.      Pediatric Multivitamins-Iron (FLINTSTONES COMPLETE) 18 MG CHEW Chew 2 tablets by mouth daily. With iron     promethazine (PHENERGAN) 25 MG tablet Take 25 mg by mouth every 6 (six) hours as needed for vomiting or nausea.     traMADol (ULTRAM) 50 MG tablet Take 50 mg by mouth every 4 (four) hours as needed for pain.     vitamin C (ASCORBIC ACID) 500 MG tablet Take 500 mg by mouth daily.     omeprazole (PRILOSEC) 40 MG capsule Take 1 capsule (40 mg total) by mouth in the morning and at bedtime. Open capsule and swallow granules (Patient not taking: Reported on 07/09/2022) 60 capsule 2   sucralfate (CARAFATE) 1 g tablet Take 1 tablet (1 g total) by mouth 4 (four) times daily. (Patient not taking: Reported on 07/09/2022) 120 tablet 0   No current facility-administered medications for this  visit.    Allergies as of 07/09/2022 - Review Complete 07/09/2022  Allergen Reaction Noted   Bee pollen Anaphylaxis 08/04/2011   Bee venom Anaphylaxis and Itching 01/12/2020   Hydrocodone-acetaminophen Itching 06/19/2015   Chocolate Other (See Comments) 07/02/2011   Nsaids Nausea And Vomiting 07/31/2011   Synthroid [levothyroxine sodium] Itching and Swelling 06/09/2011   Latex Rash 07/07/2011   Neosporin [neomycin-bacitracin zn-polymyx] Rash 07/07/2011    Family History  Problem Relation Age of Onset   Colon cancer Neg Hx     Social History   Socioeconomic History   Marital status: Widowed    Spouse name: Not on file   Number of children: Not on file   Years of education: Not on file   Highest education level: Not on file  Occupational History   Not on file  Tobacco Use   Smoking status: Never    Passive exposure: Never   Smokeless tobacco: Never  Vaping Use   Vaping  Use: Never used  Substance and Sexual Activity   Alcohol use: No   Drug use: No   Sexual activity: Never  Other Topics Concern   Not on file  Social History Narrative   Husband passed away 8 years ago from [employer].    Social Determinants of Health   Financial Resource Strain: Not on file  Food Insecurity: No Food Insecurity (12/30/2021)   Hunger Vital Sign    Worried About Running Out of Food in the Last Year: Never true    Ran Out of Food in the Last Year: Never true  Transportation Needs: No Transportation Needs (12/30/2021)   PRAPARE - Administrator, Civil Service (Medical): No    Lack of Transportation (Non-Medical): No  Physical Activity: Not on file  Stress: Not on file  Social Connections: Not on file    Review of systems General: negative for malaise, night sweats, fever, chills, +weight loss  Neck: Negative for lumps, goiter, pain and significant neck swelling Resp: Negative for cough, wheezing, dyspnea at rest CV: Negative for chest pain, leg swelling,  palpitations, orthopnea GI: denies melena, hematochezia, diarrhea, constipation, dysphagia, odynphagia +early satiety +epigastric pain +nausea +vomiting +weight loss  MSK: Negative for joint pain or swelling, back pain, and muscle pain. Derm: Negative for itching or rash Psych: Denies depression, anxiety, memory loss, confusion. No homicidal or suicidal ideation.  Heme: Negative for prolonged bleeding, bruising easily, and swollen nodes. Endocrine: Negative for cold or heat intolerance, polyuria, polydipsia and goiter. Neuro: negative for tremor, gait imbalance, syncope and seizures. The remainder of the review of systems is noncontributory.  Physical Exam: BP 139/70 (BP Location: Left Arm, Patient Position: Sitting, Cuff Size: Large)   Pulse 79   Temp 98.4 F (36.9 C) (Temporal)   Ht 5\' 10"  (1.778 m)   Wt 166 lb 4.8 oz (75.4 kg)   LMP 08/14/2011   BMI 23.86 kg/m  General:   Alert and oriented. No distress noted. Pleasant and cooperative.  Head:  Normocephalic and atraumatic. Eyes:  Conjuctiva clear without scleral icterus. Mouth:  Oral mucosa pink and moist. Good dentition. No lesions. Heart: Normal rate and rhythm, s1 and s2 heart sounds present.  Lungs: Clear lung sounds in all lobes. Respirations equal and unlabored. Abdomen:  +BS, soft, non-tender and non-distended. No rebound or guarding. No HSM or masses noted. Derm: No palmar erythema or jaundice Msk:  Symmetrical without gross deformities. Normal posture. Extremities:  Without edema. Neurologic:  Alert and  oriented x4 Psych:  Alert and cooperative. Normal mood and affect.  Invalid input(s): "6 MONTHS"   ASSESSMENT: [name] is a 62 y.o. female presenting today for hospital follow up with nausea, vomiting, epigastric pain, weight loss and early satiety.  Hospital admission in November for melena/anemia, notable anastomotic ulceration with visible vessel at site of previous RYGB. She was advised to continue PPI BID,  started on carafate and recommended repeat EGD in 3 months. Lost to follow up for EGD. Not taking PPI at this time. Having epigastric, pain, weight loss, nausea, vomiting and early satiety. Taking pepto PRN for her symptoms. No melena. Recommend proceeding with EGD for follow up/further evaluation. Will restart Omeprazole 40mg  daily and send Carafate 1g QID as well.   Patient inquired about something else for her chronic back pain besides tramadol as this does not work well for her, ibuprofen worked better but she was advised to avoid these due to PUD. I discussed with patient that we  do not treat chronic pain as it is not GI related and she should follow up with her PCP for other options/recommendations on pain management.    PLAN:  Schedule EGD ASA III 2. Restart omeprazole 40mg  daily  3. Rx carafate 1g QID 4. Follow up with PCP regarding chronic pain management   All questions were answered, patient verbalized understanding and is in agreement with plan as outlined above.    Follow Up: 3 months   Philippa Vessey L. Jeanmarie Hubert, MSN, APRN, AGNP-C Adult-Gerontology Nurse Practitioner [facility]  I have reviewed the note and agree with the APP's assessment as described in this progress note  Katrinka Blazing, MD Gastroenterology and Hepatology [facility]

## 2022-07-09 NOTE — Patient Instructions (Addendum)
We will reschedule repeat EGD  Please restart Omeprazole 40mg  daily I have also sent carafate 1g to be taken before meals, try to avoid using pepto bismol  Please avoid NSAIDs (advil, aleve, naproxen, goody powder, ibuprofen) as these can be very hard on your GI tract, causing inflammation, ulcers and damage to the lining of your GI tract.   Follow up 3 months

## 2022-07-13 ENCOUNTER — Encounter (HOSPITAL_COMMUNITY): Payer: Self-pay | Admitting: Hematology

## 2022-07-28 ENCOUNTER — Encounter (HOSPITAL_COMMUNITY): Payer: Self-pay | Admitting: Hematology

## 2022-07-28 ENCOUNTER — Telehealth (INDEPENDENT_AMBULATORY_CARE_PROVIDER_SITE_OTHER): Payer: Self-pay | Admitting: Gastroenterology

## 2022-07-28 ENCOUNTER — Encounter (INDEPENDENT_AMBULATORY_CARE_PROVIDER_SITE_OTHER): Payer: Self-pay

## 2022-07-28 NOTE — Telephone Encounter (Signed)
Pt walked into office today to schedule EGD since insurance is now showing active. EGD scheduled for 09/08/22 at 1pm. Instructions given to patient while in office. Will send letter with pre op appt.

## 2022-08-14 ENCOUNTER — Encounter (INDEPENDENT_AMBULATORY_CARE_PROVIDER_SITE_OTHER): Payer: Self-pay

## 2022-08-14 NOTE — Telephone Encounter (Signed)
Letter sent with pre op appt info

## 2022-08-18 ENCOUNTER — Ambulatory Visit (INDEPENDENT_AMBULATORY_CARE_PROVIDER_SITE_OTHER): Admitting: Gastroenterology

## 2022-09-02 ENCOUNTER — Encounter (HOSPITAL_COMMUNITY): Payer: Self-pay | Admitting: Hematology

## 2022-09-03 NOTE — Patient Instructions (Signed)
[name]  09/03/2022     @PREFPERIOPPHARMACY @   Your procedure is scheduled on  09/08/2022.   Report to [facility] at  1110 A.M.   Call this number if you have problems the morning of surgery:  [phone]  If you experience any cold or flu symptoms such as cough, fever, chills, shortness of breath, etc. between now and your scheduled surgery, please notify us at the above number.   Remember:  Follow the diet and prep instructions given to you by the office.     Take these medicines the morning of surgery with A SIP OF WATER         xanax(if needed), levothyroxine, omeprazole, tramadol(if needed).    Do not wear jewelry, make-up or nail polish, including gel polish,  artificial nails, or any other type of covering on natural nails (fingers and  toes).  Do not wear lotions, powders, or perfumes, or deodorant.  Do not shave 48 hours prior to surgery.  Men may shave face and neck.  Do not bring valuables to the hospital.  [facility] is not responsible for any belongings or valuables.  Contacts, dentures or bridgework may not be worn into surgery.  Leave your suitcase in the car.  After surgery it may be brought to your room.  For patients admitted to the hospital, discharge time will be determined by your treatment team.  Patients discharged the day of surgery will not be allowed to drive home and must have someone with them for 24 hours.    Special instructions:   DO NOT smoke tobacco or vape for 24 hours before your procedure.  Please read over the following fact sheets that you were given. Anesthesia Post-op Instructions and Care and Recovery After Surgery       Upper Endoscopy, Adult, Care After After the procedure, it is common to have a sore throat. It is also common to have: Mild stomach pain or discomfort. Bloating. Nausea. Follow these instructions at home: The instructions below may help you care for yourself at home. Your health care provider  may give you more instructions. If you have questions, ask your health care provider. If you were given a sedative during the procedure, it can affect you for several hours. Do not drive or operate machinery until your health care provider says that it is safe. If you will be going home right after the procedure, plan to have a responsible adult: Take you home from the hospital or clinic. You will not be allowed to drive. Care for you for the time you are told. Follow instructions from your health care provider about what you may eat and drink. Return to your normal activities as told by your health care provider. Ask your health care provider what activities are safe for you. Take over-the-counter and prescription medicines only as told by your health care provider. Contact a health care provider if you: Have a sore throat that lasts longer than one day. Have trouble swallowing. Have a fever. Get help right away if you: Vomit blood or your vomit looks like coffee grounds. Have bloody, black, or tarry stools. Have a very bad sore throat or you cannot swallow. Have difficulty breathing or very bad pain in your chest or abdomen. These symptoms may be an emergency. Get help right away. Call 911. Do not wait to see if the symptoms will go away. Do not drive yourself to the hospital. Summary After the procedure, it  is common to have a sore throat, mild stomach discomfort, bloating, and nausea. If you were given a sedative during the procedure, it can affect you for several hours. Do not drive until your health care provider says that it is safe. Follow instructions from your health care provider about what you may eat and drink. Return to your normal activities as told by your health care provider. This information is not intended to replace advice given to you by your health care provider. Make sure you discuss any questions you have with your health care provider. Document Revised: 05/07/2021  Document Reviewed: 05/07/2021 Elsevier Patient Education  2024 [employer] Monitored Anesthesia Care, Care After The following information offers guidance on how to care for yourself after your procedure. Your health care provider may also give you more specific instructions. If you have problems or questions, contact your health care provider. What can I expect after the procedure? After the procedure, it is common to have: Tiredness. Little or no memory about what happened during or after the procedure. Impaired judgment when it comes to making decisions. Nausea or vomiting. Some trouble with balance. Follow these instructions at home: For the time period you were told by your health care provider:  Rest. Do not participate in activities where you could fall or become injured. Do not drive or use machinery. Do not drink alcohol. Do not take sleeping pills or medicines that cause drowsiness. Do not make important decisions or sign legal documents. Do not take care of children on your own. Medicines Take over-the-counter and prescription medicines only as told by your health care provider. If you were prescribed antibiotics, take them as told by your health care provider. Do not stop using the antibiotic even if you start to feel better. Eating and drinking Follow instructions from your health care provider about what you may eat and drink. Drink enough fluid to keep your urine pale yellow. If you vomit: Drink clear fluids slowly and in small amounts as you are able. Clear fluids include water, ice chips, low-calorie sports drinks, and fruit juice that has water added to it (diluted fruit juice). Eat light and bland foods in small amounts as you are able. These foods include bananas, applesauce, rice, lean meats, toast, and crackers. General instructions  Have a responsible adult stay with you for the time you are told. It is important to have someone help care for you until you  are awake and alert. If you have sleep apnea, surgery and some medicines can increase your risk for breathing problems. Follow instructions from your health care provider about wearing your sleep device: When you are sleeping. This includes during daytime naps. While taking prescription pain medicines, sleeping medicines, or medicines that make you drowsy. Do not use any products that contain nicotine or tobacco. These products include cigarettes, chewing tobacco, and vaping devices, such as e-cigarettes. If you need help quitting, ask your health care provider. Contact a health care provider if: You feel nauseous or vomit every time you eat or drink. You feel light-headed. You are still sleepy or having trouble with balance after 24 hours. You get a rash. You have a fever. You have redness or swelling around the IV site. Get help right away if: You have trouble breathing. You have new confusion after you get home. These symptoms may be an emergency. Get help right away. Call 911. Do not wait to see if the symptoms will go away. Do not drive yourself to the  hospital. This information is not intended to replace advice given to you by your health care provider. Make sure you discuss any questions you have with your health care provider. Document Revised: 06/23/2021 Document Reviewed: 06/23/2021 Elsevier Patient Education  2024 [employer].

## 2022-09-04 ENCOUNTER — Encounter (HOSPITAL_COMMUNITY)
Admission: RE | Admit: 2022-09-04 | Discharge: 2022-09-04 | Disposition: A | Discharge status: Home / Self Care | Source: Ambulatory Visit | Attending: Gastroenterology | Admitting: Gastroenterology

## 2022-09-04 ENCOUNTER — Encounter (HOSPITAL_COMMUNITY): Payer: Self-pay

## 2022-09-04 VITALS — BP 158/87 | HR 77 | Temp 98.1°F | Resp 16 | Ht 70.0 in | Wt 155.0 lb

## 2022-09-04 DIAGNOSIS — Z01812 Encounter for preprocedural laboratory examination: Secondary | ICD-10-CM | POA: Diagnosis present

## 2022-09-04 DIAGNOSIS — D649 Anemia, unspecified: Secondary | ICD-10-CM | POA: Diagnosis not present

## 2022-09-04 LAB — CBC WITH DIFFERENTIAL/PLATELET
Abs Immature Granulocytes: 0 10*3/uL (ref 0.00–0.07)
Basophils Absolute: 0 10*3/uL (ref 0.0–0.1)
Basophils Relative: 1 %
Eosinophils Absolute: 0.1 10*3/uL (ref 0.0–0.5)
Eosinophils Relative: 2 %
HCT: 40.1 % (ref 36.0–46.0)
Hemoglobin: 12.3 g/dL (ref 12.0–15.0)
Immature Granulocytes: 0 %
Lymphocytes Relative: 45 %
Lymphs Abs: 1.7 10*3/uL (ref 0.7–4.0)
MCH: 26.7 pg (ref 26.0–34.0)
MCHC: 30.7 g/dL (ref 30.0–36.0)
MCV: 87.2 fL (ref 80.0–100.0)
Monocytes Absolute: 0.3 10*3/uL (ref 0.1–1.0)
Monocytes Relative: 7 %
Neutro Abs: 1.7 10*3/uL (ref 1.7–7.7)
Neutrophils Relative %: 45 %
Platelets: 170 10*3/uL (ref 150–400)
RBC: 4.6 MIL/uL (ref 3.87–5.11)
RDW: 13.2 % (ref 11.5–15.5)
WBC: 3.7 10*3/uL — ABNORMAL LOW (ref 4.0–10.5)
nRBC: 0 % (ref 0.0–0.2)

## 2022-09-08 ENCOUNTER — Ambulatory Visit (HOSPITAL_COMMUNITY): Admitting: Certified Registered"

## 2022-09-08 ENCOUNTER — Ambulatory Visit (HOSPITAL_COMMUNITY)
Admission: RE | Admit: 2022-09-08 | Discharge: 2022-09-08 | Disposition: A | Discharge status: Home / Self Care | Source: Home / Self Care | Attending: Gastroenterology | Admitting: Gastroenterology

## 2022-09-08 ENCOUNTER — Encounter (HOSPITAL_COMMUNITY)
Admission: RE | Disposition: A | Discharge status: Home / Self Care | Payer: Self-pay | Source: Home / Self Care | Attending: Gastroenterology

## 2022-09-08 ENCOUNTER — Ambulatory Visit (HOSPITAL_BASED_OUTPATIENT_CLINIC_OR_DEPARTMENT_OTHER): Admitting: Certified Registered"

## 2022-09-08 ENCOUNTER — Encounter (HOSPITAL_COMMUNITY): Payer: Self-pay | Admitting: Gastroenterology

## 2022-09-08 DIAGNOSIS — E039 Hypothyroidism, unspecified: Secondary | ICD-10-CM | POA: Diagnosis not present

## 2022-09-08 DIAGNOSIS — I10 Essential (primary) hypertension: Secondary | ICD-10-CM | POA: Insufficient documentation

## 2022-09-08 DIAGNOSIS — Z9884 Bariatric surgery status: Secondary | ICD-10-CM | POA: Diagnosis not present

## 2022-09-08 DIAGNOSIS — Y738 Miscellaneous gastroenterology and urology devices associated with adverse incidents, not elsewhere classified: Secondary | ICD-10-CM | POA: Diagnosis not present

## 2022-09-08 DIAGNOSIS — K283 Acute gastrojejunal ulcer without hemorrhage or perforation: Secondary | ICD-10-CM

## 2022-09-08 DIAGNOSIS — Z8711 Personal history of peptic ulcer disease: Secondary | ICD-10-CM | POA: Insufficient documentation

## 2022-09-08 DIAGNOSIS — Z09 Encounter for follow-up examination after completed treatment for conditions other than malignant neoplasm: Secondary | ICD-10-CM | POA: Diagnosis not present

## 2022-09-08 DIAGNOSIS — K9589 Other complications of other bariatric procedure: Secondary | ICD-10-CM | POA: Diagnosis not present

## 2022-09-08 DIAGNOSIS — K222 Esophageal obstruction: Secondary | ICD-10-CM

## 2022-09-08 DIAGNOSIS — T85858A Stenosis due to other internal prosthetic devices, implants and grafts, initial encounter: Secondary | ICD-10-CM | POA: Diagnosis not present

## 2022-09-08 DIAGNOSIS — D649 Anemia, unspecified: Secondary | ICD-10-CM | POA: Insufficient documentation

## 2022-09-08 DIAGNOSIS — R112 Nausea with vomiting, unspecified: Secondary | ICD-10-CM | POA: Diagnosis present

## 2022-09-08 DIAGNOSIS — F419 Anxiety disorder, unspecified: Secondary | ICD-10-CM | POA: Insufficient documentation

## 2022-09-08 DIAGNOSIS — K9189 Other postprocedural complications and disorders of digestive system: Secondary | ICD-10-CM

## 2022-09-08 HISTORY — PX: ESOPHAGOGASTRODUODENOSCOPY (EGD) WITH PROPOFOL: SHX5813

## 2022-09-08 SURGERY — ESOPHAGOGASTRODUODENOSCOPY (EGD) WITH PROPOFOL
Anesthesia: General

## 2022-09-08 MED ORDER — LIDOCAINE HCL (PF) 2 % IJ SOLN
INTRAMUSCULAR | Status: AC
  Filled 2022-09-08: qty 10

## 2022-09-08 MED ORDER — PROPOFOL 500 MG/50ML IV EMUL
INTRAVENOUS | Status: AC
  Filled 2022-09-08: qty 50

## 2022-09-08 MED ORDER — LACTATED RINGERS IV SOLN: INTRAVENOUS | Status: DC

## 2022-09-08 MED ORDER — PROPOFOL 500 MG/50ML IV EMUL
INTRAVENOUS | Status: DC | PRN
  Administered 2022-09-08: 150 ug/kg/min via INTRAVENOUS

## 2022-09-08 MED ORDER — PROPOFOL 10 MG/ML IV BOLUS
INTRAVENOUS | Status: DC | PRN
Start: 2022-09-08 — End: 2022-09-08
  Administered 2022-09-08: 40 mg via INTRAVENOUS
  Administered 2022-09-08: 100 mg via INTRAVENOUS

## 2022-09-08 MED ORDER — LIDOCAINE HCL (CARDIAC) PF 100 MG/5ML IV SOSY
PREFILLED_SYRINGE | INTRAVENOUS | Status: DC | PRN
  Administered 2022-09-08: 100 mg via INTRAVENOUS

## 2022-09-08 NOTE — Discharge Instructions (Signed)
You are being discharged to home.  Resume your previous diet.  Do not take any aspirin, ibuprofen (including Advil, Motrin or Nuprin), naproxen (including Aleve), or any other non-steroidal anti-inflammatory drugs.  Continue your present medications.  Must open omeprazole capsule and swallow granules.

## 2022-09-08 NOTE — Transfer of Care (Addendum)
Immediate Anesthesia Transfer of Care Note  Patient: Sande Rives  Procedure(s) Performed: ESOPHAGOGASTRODUODENOSCOPY (EGD) WITH PROPOFOL  Patient Location: Short Stay  Anesthesia Type:General  Level of Consciousness: awake and patient cooperative  Airway & Oxygen Therapy: Patient Spontanous Breathing and Patient connected to face mask oxygen  Post-op Assessment: Report given to RN and Post -op Vital signs reviewed and stable  Post vital signs: Reviewed and stable  Last Vitals:  Vitals Value Taken Time  BP 136/80 09/08/22   1339  Temp 36.5 09/08/22   1339  Pulse 87 09/08/22   1339  Resp 18 09/08/22   1339  SpO2 99% 09/08/22   1339    Last Pain:  Vitals:   09/08/22 1109  PainSc: 0-No pain         Complications: No notable events documented.

## 2022-09-08 NOTE — H&P (Signed)
[name] is an 62 y.o. female.   Chief Complaint: Recurrent vomiting and nausea HPI: 62 year old female with past medical history of depression, obesity status post gastric bypass complicated by anastomotic ulcer and stenosis requiring stent placement in the past, hypothyroidism, hypertension, who comes for evaluation of recurrent nausea and vomiting after having food intake.  Patient has presented recurrent episodes of nausea and vomiting despite taking her medication compliantly.  Has lost weight because of this. The patient denies having any  fever, chills, hematochezia, melena, hematemesis, abdominal distention, abdominal pain, diarrhea, jaundice, pruritus.   Past Medical History:  Diagnosis Date   Depression    started Lexapro when husband passed away   Gastric ulcer 01/09/2022   Gastric ulcer 01/2020   Hypertension    Hypothyroidism    IBS (irritable bowel syndrome)    Iron deficiency anemia 04/11/2021   PONV (postoperative nausea and vomiting)     Past Surgical History:  Procedure Laterality Date   ABDOMINAL HYSTERECTOMY  08/11/2011   Procedure: HYSTERECTOMY ABDOMINAL;  Surgeon: [name], MD;  Location: [facility];  Service: Gynecology;  Laterality: N/A;   BIOPSY  01/11/2020   Procedure: BIOPSY;  Surgeon: Corbin Ade, MD;  [facility];  Service: Endoscopy;;   CESAREAN SECTION     x 2   CHOLECYSTECTOMY  2009   [facility]   ESOPHAGOGASTRODUODENOSCOPY  Jan 2004   RMR: normal esophagus, small hiatal hernia, normal D1, D2   ESOPHAGOGASTRODUODENOSCOPY (EGD) WITH PROPOFOL N/A 01/11/2020   Procedure: ESOPHAGOGASTRODUODENOSCOPY (EGD) WITH PROPOFOL;  Surgeon: Corbin Ade, MD;  [facility];  Service: Endoscopy;  Laterality: N/A;   ESOPHAGOGASTRODUODENOSCOPY (EGD) WITH PROPOFOL N/A 06/05/2020   Procedure: ESOPHAGOGASTRODUODENOSCOPY (EGD) WITH PROPOFOL;  Surgeon: Malissa Hippo, MD;  [facility];  Service: Endoscopy;  Laterality: N/A;  am    ESOPHAGOGASTRODUODENOSCOPY (EGD) WITH PROPOFOL N/A 12/31/2021   Procedure: ESOPHAGOGASTRODUODENOSCOPY (EGD) WITH PROPOFOL;  Surgeon: Corbin Ade, MD;  [facility];  Service: Endoscopy;  Laterality: N/A;   FOOT SURGERY     x 5 right   GASTRIC BYPASS     gastric stent  2020   HAND SURGERY     x2 right   HEMATOMA EVACUATION  08/14/2011   Procedure: EVACUATION HEMATOMA;  Surgeon: [name], MD;  Location: [facility];  Service: Gynecology;  Laterality: N/A;   SALPINGOOPHORECTOMY  08/11/2011   Procedure: SALPINGO OOPHERECTOMY;  Surgeon: [name], MD;  Location: [facility];  Service: Gynecology;  Laterality: Bilateral;   SCAR REVISION  08/11/2011   Procedure: SCAR REVISION;  Surgeon: [name], MD;  Location: [facility];  Service: Gynecology;  Laterality: N/A;  Excision of Wide Cicatrix   SCLEROTHERAPY N/A 12/31/2021   Procedure: SCLEROTHERAPY;  Surgeon: Corbin Ade, MD;  [facility];  Service: Endoscopy;  Laterality: N/A;    Family History  Problem Relation Age of Onset   Colon cancer Neg Hx    Social History:  reports that she has never smoked. She has never been exposed to tobacco smoke. She has never used smokeless tobacco. She reports that she does not drink alcohol and does not use drugs.  Allergies:  Allergies  Allergen Reactions   Bee Pollen Anaphylaxis   Bee Venom Anaphylaxis and Itching    Swelling and itching   Hydrocodone-Acetaminophen Itching   Chocolate Other (See Comments)    Reaction: causes facial blistering,inside mouth   Nsaids Nausea And Vomiting  Synthroid [Levothyroxine Sodium] Itching and Swelling    Patient has a reaction to brand name synthroid only   Latex Swelling and Rash   Neosporin [Neomycin-Bacitracin Zn-Polymyx] Rash    Medications Prior to Admission  Medication Sig Dispense Refill   ALPRAZolam (XANAX) 0.5 MG tablet Take 0.5 mg by mouth See admin instructions. Take 1 tablet (0.5 mg) by mouth scheduled at bedtime for  sleep, may take an additional dose (0.5 mg) during the day, if needed for anxiety.     barrier cream (NON-SPECIFIED) CREA Apply 1 Application topically 2 (two) times daily as needed (discomfort/skin breakdown). Fannie barrier cream     bismuth subsalicylate (PEPTO BISMOL) 262 MG chewable tablet Chew 262-524 mg by mouth every 6 (six) hours as needed for diarrhea or loose stools or indigestion.     diphenhydrAMINE (BENADRYL) 25 mg capsule Take 50 mg by mouth in the morning and at bedtime.     levothyroxine (SYNTHROID, LEVOTHROID) 125 MCG tablet Take 125 mcg by mouth daily before breakfast.      omeprazole (PRILOSEC) 40 MG capsule Take 1 capsule (40 mg total) by mouth daily. (Patient taking differently: Take 40 mg by mouth in the morning and at bedtime.) 60 capsule 1   promethazine (PHENERGAN) 25 MG tablet Take 25 mg by mouth 3 (three) times daily as needed for vomiting or nausea.     sucralfate (CARAFATE) 1 GM/10ML suspension Take 10 mLs (1 g total) by mouth 4 (four) times daily -  with meals and at bedtime. (Patient taking differently: Take 1 g by mouth in the morning, at noon, and at bedtime.) 420 mL 1   traMADol (ULTRAM) 50 MG tablet Take 50 mg by mouth every 4 (four) hours as needed for pain.     vitamin C (ASCORBIC ACID) 500 MG tablet Take 500 mg by mouth daily in the afternoon.     Pediatric Multivitamins-Iron (FLINTSTONES COMPLETE) 18 MG CHEW Chew 2 tablets by mouth daily. With iron      No results found for this or any previous visit (from the past 48 hour(s)). No results found.  Review of Systems  Gastrointestinal:  Positive for nausea and vomiting.  All other systems reviewed and are negative.   Blood pressure (!) 161/88, pulse 85, temperature 98.3 F (36.8 C), resp. rate 16, last menstrual period 08/14/2011, SpO2 100%. Physical Exam  GENERAL: The patient is AO x3, in no acute distress. HEENT: Head is normocephalic and atraumatic. EOMI are intact. Mouth is well hydrated and without  lesions. NECK: Supple. No masses LUNGS: Clear to auscultation. No presence of rhonchi/wheezing/rales. Adequate chest expansion HEART: RRR, normal s1 and s2. ABDOMEN: Soft, nontender, no guarding, no peritoneal signs, and nondistended. BS +. No masses. EXTREMITIES: Without any cyanosis, clubbing, rash, lesions or edema. NEUROLOGIC: AOx3, no focal motor deficit. SKIN: no jaundice, no rashes  Assessment/Plan 62 year old female with past medical history of depression, obesity status post gastric bypass complicated by anastomotic ulcer and stenosis requiring stent placement in the past, hypothyroidism, hypertension, who comes for evaluation of recurrent nausea and vomiting after having food intake.  Will proceed with EGD.  Dolores Frame, MD 09/08/2022, 11:42 AM

## 2022-09-08 NOTE — Op Note (Addendum)
[facility] Patient Name: [name] Procedure Date: 09/08/2022 1:14 PM MRN: [id number] Date of Birth: [birth year] Attending MD: Katrinka Blazing , , [id number] CSN: [id number] Age: 62 Admit Type: Outpatient Procedure:                Upper GI endoscopy Indications:              Follow-up of acute gastrojejunal ulcer, Nausea with                            vomiting Providers:                Katrinka Blazing, Nena Polio, RN, Pandora Leiter,                            Technician Referring MD:              Medicines:                Monitored Anesthesia Care Complications:            No immediate complications. Estimated Blood Loss:     Estimated blood loss: none. Procedure:                Pre-Anesthesia Assessment:                           - Prior to the procedure, a History and Physical                            was performed, and patient medications, allergies                            and sensitivities were reviewed. The patient's                            tolerance of previous anesthesia was reviewed.                           - The risks and benefits of the procedure and the                            sedation options and risks were discussed with the                            patient. All questions were answered and informed                            consent was obtained.                           - ASA Grade Assessment: III - A patient with severe                            systemic disease.                           After obtaining informed consent, the endoscope was  passed under direct vision. Throughout the                            procedure, the patient's blood pressure, pulse, and                            oxygen saturations were monitored continuously. The                            [id number]-[id number] ([id number]) scope was introduced through the                            mouth, and advanced to the jejunum. The upper GI                             endoscopy was accomplished without difficulty. The                            patient tolerated the procedure well. Scope In: 1:25:46 PM Scope Out: 1:32:13 PM Total Procedure Duration: 0 hours 6 minutes 27 seconds  Findings:      The examined esophagus was normal.      Evidence of a gastric bypass was found. A gastric pouch with a 8 cm       length from the GE junction to the gastrojejunal anastomosis was found.       The staple line appeared intact. The gastrojejunal anastomosis was       characterized by moderate stenosis and stenosis 12 mm (inner diameter) x       1 cm (length). This was traversed. The pouch-to-jejunum limb was       characterized by healthy appearing mucosa. A TTS dilator was passed       through the scope. Dilation with a 12-13.5-15 mm pyloric balloon dilator       was performed. The dilation site was examined and showed mild       improvement in luminal narrowing.      The examined jejunum was normal. Impression:               - Normal esophagus.                           - Gastric bypass with a pouch 8 cm in length and                            intact staple line. Gastrojejunal anastomosis                            characterized by moderate stenosis. Dilated.                           - Normal examined jejunum.                           - No specimens collected. Moderate Sedation:      Per Anesthesia Care Recommendation:           - Discharge patient to home (ambulatory).                           -  Resume previous diet - eat small portions 5-6                            times per day.                           - No aspirin, ibuprofen, naproxen, or other                            non-steroidal anti-inflammatory drugs.                           - Continue present medications.                           - Must open omeprazole capsule and swallow granules.                           - Repeat EGD in 6-8 weeks. Procedure Code(s):        --- Professional ---                            [phone], Esophagogastroduodenoscopy, flexible,                            transoral; with dilation of gastric/duodenal                            stricture(s) (eg, balloon, bougie) Diagnosis Code(s):        --- Professional ---                           K95.89, Other complications of other bariatric                            procedure                           K28.3, Acute gastrojejunal ulcer without hemorrhage                            or perforation                           R11.2, Nausea with vomiting, unspecified CPT copyright 2022 [employer]. All rights reserved. The codes documented in this report are preliminary and upon coder review may  be revised to meet current compliance requirements. Katrinka Blazing, MD Katrinka Blazing,  09/08/2022 1:43:28 PM This report has been signed electronically. Number of Addenda: 0

## 2022-09-08 NOTE — Anesthesia Preprocedure Evaluation (Signed)
Anesthesia Evaluation  Patient identified by MRN, date of birth, ID band Patient awake    Reviewed: Allergy & Precautions, H&P , NPO status , Patient's Chart, lab work & pertinent test results, reviewed documented beta blocker date and time   History of Anesthesia Complications (+) PONV and history of anesthetic complications  Airway Mallampati: II  TM Distance: >3 FB Neck ROM: full    Dental no notable dental hx.    Pulmonary neg pulmonary ROS   Pulmonary exam normal breath sounds clear to auscultation       Cardiovascular Exercise Tolerance: Good hypertension, negative cardio ROS  Rhythm:regular Rate:Normal     Neuro/Psych  PSYCHIATRIC DISORDERS Anxiety Depression    negative neurological ROS  negative psych ROS   GI/Hepatic negative GI ROS, Neg liver ROS, PUD,,,  Endo/Other  negative endocrine ROSHypothyroidism    Renal/GU negative Renal ROS  negative genitourinary   Musculoskeletal   Abdominal   Peds  Hematology negative hematology ROS (+) Blood dyscrasia, anemia   Anesthesia Other Findings   Reproductive/Obstetrics negative OB ROS                             Anesthesia Physical Anesthesia Plan  ASA: 2  Anesthesia Plan: General   Post-op Pain Management:    Induction:   PONV Risk Score and Plan: Propofol infusion  Airway Management Planned:   Additional Equipment:   Intra-op Plan:   Post-operative Plan:   Informed Consent: I have reviewed the patients History and Physical, chart, labs and discussed the procedure including the risks, benefits and alternatives for the proposed anesthesia with the patient or authorized representative who has indicated his/her understanding and acceptance.     Dental Advisory Given  Plan Discussed with: CRNA  Anesthesia Plan Comments:        Anesthesia Quick Evaluation

## 2022-09-10 ENCOUNTER — Telehealth (INDEPENDENT_AMBULATORY_CARE_PROVIDER_SITE_OTHER): Payer: Self-pay | Admitting: Gastroenterology

## 2022-09-10 NOTE — Telephone Encounter (Signed)
Dolores Frame, MD  Marlowe Shores, LPN Hi Kenney Houseman,  Can you please schedule an EGD in 6-8 weeks? Dx: gastrojejunal anastomosis. Room: any  Thanks,  Katrinka Blazing, MD Gastroenterology and Hepatology [facility] Gastroenterology

## 2022-09-11 ENCOUNTER — Encounter (HOSPITAL_COMMUNITY): Payer: Self-pay | Admitting: Gastroenterology

## 2022-09-11 NOTE — Anesthesia Postprocedure Evaluation (Signed)
Anesthesia Post Note  Patient: [name]  Procedure(s) Performed: ESOPHAGOGASTRODUODENOSCOPY (EGD) WITH PROPOFOL  Patient location during evaluation: Phase II Anesthesia Type: General Level of consciousness: awake Pain management: pain level controlled Vital Signs Assessment: post-procedure vital signs reviewed and stable Respiratory status: spontaneous breathing and respiratory function stable Cardiovascular status: blood pressure returned to baseline and stable Postop Assessment: no headache and no apparent nausea or vomiting Anesthetic complications: no Comments: Late entry   No notable events documented.   Last Vitals:  Vitals:   09/08/22 1110 09/08/22 1339  BP: (!) 161/88 136/80  Pulse: 85 86  Resp: 16 18  Temp: 36.8 C 36.5 C  SpO2: 100% 99%    Last Pain:  Vitals:   09/08/22 1339  TempSrc: Oral  PainSc: 0-No pain                 Windell Norfolk

## 2022-09-28 ENCOUNTER — Encounter (INDEPENDENT_AMBULATORY_CARE_PROVIDER_SITE_OTHER): Payer: Self-pay | Admitting: *Deleted

## 2022-10-14 NOTE — Telephone Encounter (Signed)
Pt contacted and scheduled for 10/21/22 at 8:15am with Dr.Castaneda. pt states she will use her instructions from previous EGD. Informed pt of arrival time on 10/21/22 and went over instructions via phone.

## 2022-10-21 ENCOUNTER — Encounter (HOSPITAL_COMMUNITY)
Admission: RE | Disposition: A | Discharge status: Home / Self Care | Payer: Self-pay | Source: Home / Self Care | Attending: Gastroenterology

## 2022-10-21 ENCOUNTER — Ambulatory Visit (HOSPITAL_COMMUNITY): Admitting: Anesthesiology

## 2022-10-21 ENCOUNTER — Encounter (HOSPITAL_COMMUNITY): Payer: Self-pay | Admitting: Gastroenterology

## 2022-10-21 ENCOUNTER — Ambulatory Visit (HOSPITAL_COMMUNITY)
Admission: RE | Admit: 2022-10-21 | Discharge: 2022-10-21 | Disposition: A | Discharge status: Home / Self Care | Attending: Gastroenterology | Admitting: Gastroenterology

## 2022-10-21 ENCOUNTER — Other Ambulatory Visit: Payer: Self-pay

## 2022-10-21 DIAGNOSIS — K9189 Other postprocedural complications and disorders of digestive system: Secondary | ICD-10-CM

## 2022-10-21 DIAGNOSIS — K589 Irritable bowel syndrome without diarrhea: Secondary | ICD-10-CM | POA: Insufficient documentation

## 2022-10-21 DIAGNOSIS — Z9884 Bariatric surgery status: Secondary | ICD-10-CM | POA: Diagnosis not present

## 2022-10-21 DIAGNOSIS — I10 Essential (primary) hypertension: Secondary | ICD-10-CM | POA: Diagnosis not present

## 2022-10-21 DIAGNOSIS — K9589 Other complications of other bariatric procedure: Secondary | ICD-10-CM | POA: Diagnosis not present

## 2022-10-21 DIAGNOSIS — E039 Hypothyroidism, unspecified: Secondary | ICD-10-CM | POA: Diagnosis not present

## 2022-10-21 DIAGNOSIS — K56699 Other intestinal obstruction unspecified as to partial versus complete obstruction: Secondary | ICD-10-CM | POA: Insufficient documentation

## 2022-10-21 HISTORY — PX: ESOPHAGOGASTRODUODENOSCOPY (EGD) WITH PROPOFOL: SHX5813

## 2022-10-21 HISTORY — PX: ESOPHAGEAL DILATION: SHX303

## 2022-10-21 SURGERY — ESOPHAGOGASTRODUODENOSCOPY (EGD) WITH PROPOFOL
Anesthesia: General

## 2022-10-21 MED ORDER — LACTATED RINGERS IV SOLN: INTRAVENOUS | Status: DC

## 2022-10-21 MED ORDER — OMEPRAZOLE 40 MG PO CPDR: 40.0000 mg | DELAYED_RELEASE_CAPSULE | Freq: Two times a day (BID) | ORAL | 3 refills | Status: DC

## 2022-10-21 MED ORDER — PROPOFOL 10 MG/ML IV BOLUS
INTRAVENOUS | Status: DC | PRN
  Administered 2022-10-21 (×3): 50 mg via INTRAVENOUS

## 2022-10-21 MED ORDER — LACTATED RINGERS IV SOLN: INTRAVENOUS | Status: DC | PRN

## 2022-10-21 NOTE — Transfer of Care (Signed)
Immediate Anesthesia Transfer of Care Note  Patient: [name]  Procedure(s) Performed: ESOPHAGOGASTRODUODENOSCOPY (EGD) WITH PROPOFOL ESOPHAGEAL DILATION  Patient Location: Short Stay  Anesthesia Type:General  Level of Consciousness: awake, alert , oriented, and patient cooperative  Airway & Oxygen Therapy: Patient Spontanous Breathing  Post-op Assessment: Report given to RN, Post -op Vital signs reviewed and stable, and Patient moving all extremities X 4  Post vital signs: Reviewed and stable  Last Vitals:  Vitals Value Taken Time  BP 130/60 10/21/22 0832  Temp 36.4 C 10/21/22 0832  Pulse 80 10/21/22 0832  Resp 17 10/21/22 0832  SpO2 100 % 10/21/22 0832    Last Pain:  Vitals:   10/21/22 0832  TempSrc: Oral  PainSc: 0-No pain      Patients Stated Pain Goal: 4 (10/21/22 0817)  Complications: No notable events documented.

## 2022-10-21 NOTE — H&P (Signed)
[name] is an 62 y.o. female.   Chief Complaint: Gastrojejunal anastomotic stricture HPI: 62 year old female with past medical history of hypertension, hypothyroidism, IBS,, gastric bypass, coming for follow-up of a gastrojejunal anastomotic stricture.  Patient reports feeling some episodes of nausea and vomiting but this is less frequent than before.  Appetite is decreased. The patient denies having any fever, chills, hematochezia, melena, hematemesis, abdominal distention, abdominal pain, diarrhea, jaundice, pruritus .   Past Medical History:  Diagnosis Date   Depression    started Lexapro when husband passed away   Gastric ulcer 01-21-22   Gastric ulcer 01/2020   Hypertension    Hypothyroidism    IBS (irritable bowel syndrome)    Iron deficiency anemia 04/11/2021   PONV (postoperative nausea and vomiting)     Past Surgical History:  Procedure Laterality Date   ABDOMINAL HYSTERECTOMY  08/11/2011   Procedure: HYSTERECTOMY ABDOMINAL;  Surgeon: [name], MD;  Location: [facility];  Service: Gynecology;  Laterality: N/A;   BIOPSY  01/11/2020   Procedure: BIOPSY;  Surgeon: Corbin Ade, MD;  [facility];  Service: Endoscopy;;   CESAREAN SECTION     x 2   CHOLECYSTECTOMY  2009   [facility]   ESOPHAGOGASTRODUODENOSCOPY  Jan 2004   RMR: normal esophagus, small hiatal hernia, normal D1, D2   ESOPHAGOGASTRODUODENOSCOPY (EGD) WITH PROPOFOL N/A 01/11/2020   Procedure: ESOPHAGOGASTRODUODENOSCOPY (EGD) WITH PROPOFOL;  Surgeon: Corbin Ade, MD;  [facility];  Service: Endoscopy;  Laterality: N/A;   ESOPHAGOGASTRODUODENOSCOPY (EGD) WITH PROPOFOL N/A 06/05/2020   Procedure: ESOPHAGOGASTRODUODENOSCOPY (EGD) WITH PROPOFOL;  Surgeon: Malissa Hippo, MD;  [facility];  Service: Endoscopy;  Laterality: N/A;  am   ESOPHAGOGASTRODUODENOSCOPY (EGD) WITH PROPOFOL N/A 12/31/2021   Procedure: ESOPHAGOGASTRODUODENOSCOPY (EGD) WITH PROPOFOL;  Surgeon: Corbin Ade, MD;  [facility];  Service: Endoscopy;  Laterality: N/A;   ESOPHAGOGASTRODUODENOSCOPY (EGD) WITH PROPOFOL N/A 09/08/2022   Procedure: ESOPHAGOGASTRODUODENOSCOPY (EGD) WITH PROPOFOL;  Surgeon: Dolores Frame, MD;  [facility];  Service: Gastroenterology;  Laterality: N/A;  1:00pm;asa 3   FOOT SURGERY     x 5 right   GASTRIC BYPASS     gastric stent  2020   HAND SURGERY     x2 right   HEMATOMA EVACUATION  08/14/2011   Procedure: EVACUATION HEMATOMA;  Surgeon: [name], MD;  Location: [facility];  Service: Gynecology;  Laterality: N/A;   SALPINGOOPHORECTOMY  08/11/2011   Procedure: SALPINGO OOPHERECTOMY;  Surgeon: [name], MD;  Location: [facility];  Service: Gynecology;  Laterality: Bilateral;   SCAR REVISION  08/11/2011   Procedure: SCAR REVISION;  Surgeon: [name], MD;  Location: [facility];  Service: Gynecology;  Laterality: N/A;  Excision of Wide Cicatrix   SCLEROTHERAPY N/A 12/31/2021   Procedure: SCLEROTHERAPY;  Surgeon: Corbin Ade, MD;  [facility];  Service: Endoscopy;  Laterality: N/A;    Family History  Problem Relation Age of Onset   Colon cancer Neg Hx    Social History:  reports that she has never smoked. She has never been exposed to tobacco smoke. She has never used smokeless tobacco. She reports that she does not drink alcohol and does not use drugs.  Allergies:  Allergies  Allergen Reactions   Bee Pollen Anaphylaxis   Bee Venom Anaphylaxis and Itching    Swelling and itching   Hydrocodone-Acetaminophen Itching   Chocolate Other (See Comments)  Reaction: causes facial blistering,inside mouth   Nsaids Nausea And Vomiting   Synthroid [Levothyroxine Sodium] Itching and Swelling    Patient has a reaction to brand name synthroid only   Latex Swelling and Rash   Neosporin [Neomycin-Bacitracin Zn-Polymyx] Rash    Medications Prior to Admission  Medication Sig Dispense Refill   ALPRAZolam (XANAX) 0.5 MG  tablet Take 0.5 mg by mouth See admin instructions. Take 1 tablet (0.5 mg) by mouth scheduled at bedtime for sleep, may take an additional dose (0.5 mg) during the day, if needed for anxiety.     barrier cream (NON-SPECIFIED) CREA Apply 1 Application topically 2 (two) times daily as needed (discomfort/skin breakdown). Fannie barrier cream     bismuth subsalicylate (PEPTO BISMOL) 262 MG chewable tablet Chew 262-524 mg by mouth every 6 (six) hours as needed for diarrhea or loose stools or indigestion.     diphenhydrAMINE (BENADRYL) 25 mg capsule Take 50 mg by mouth in the morning and at bedtime.     levothyroxine (SYNTHROID, LEVOTHROID) 125 MCG tablet Take 125 mcg by mouth daily before breakfast.      omeprazole (PRILOSEC) 40 MG capsule Take 1 capsule (40 mg total) by mouth daily. (Patient taking differently: Take 40 mg by mouth in the morning and at bedtime.) 60 capsule 1   Pediatric Multivitamins-Iron (FLINTSTONES COMPLETE) 18 MG CHEW Chew 2 tablets by mouth daily. With iron     promethazine (PHENERGAN) 25 MG tablet Take 25 mg by mouth 3 (three) times daily as needed for vomiting or nausea.     sucralfate (CARAFATE) 1 GM/10ML suspension Take 10 mLs (1 g total) by mouth 4 (four) times daily -  with meals and at bedtime. (Patient taking differently: Take 1 g by mouth in the morning, at noon, and at bedtime.) 420 mL 1   traMADol (ULTRAM) 50 MG tablet Take 50 mg by mouth every 4 (four) hours as needed for pain.     vitamin C (ASCORBIC ACID) 500 MG tablet Take 500 mg by mouth daily in the afternoon.      No results found for this or any previous visit (from the past 48 hour(s)). No results found.  Review of Systems  Gastrointestinal:  Positive for nausea and vomiting.  All other systems reviewed and are negative.   Blood pressure (!) 140/85, pulse 65, temperature 97.9 F (36.6 C), temperature source Oral, resp. rate 18, height 5\' 10"  (1.778 m), weight 63.5 kg, last menstrual period 08/14/2011, SpO2  100%. Physical Exam  GENERAL: The patient is AO x3, in no acute distress. HEENT: Head is normocephalic and atraumatic. EOMI are intact. Mouth is well hydrated and without lesions. NECK: Supple. No masses LUNGS: Clear to auscultation. No presence of rhonchi/wheezing/rales. Adequate chest expansion HEART: RRR, normal s1 and s2. ABDOMEN: Soft, nontender, no guarding, no peritoneal signs, and nondistended. BS +. No masses. EXTREMITIES: Without any cyanosis, clubbing, rash, lesions or edema. NEUROLOGIC: AOx3, no focal motor deficit. SKIN: no jaundice, no rashes  Assessment/Plan 62 year old female with past medical history of hypertension, hypothyroidism, IBS,, gastric bypass, coming for follow-up of a gastrojejunal anastomotic stricture.  Will proceed with EGD.  Dolores Frame, MD 10/21/2022, 7:37 AM

## 2022-10-21 NOTE — Anesthesia Preprocedure Evaluation (Signed)
Anesthesia Evaluation  Patient identified by MRN, date of birth, ID band Patient awake    Reviewed: Allergy & Precautions, H&P , NPO status , Patient's Chart, lab work & pertinent test results, reviewed documented beta blocker date and time   History of Anesthesia Complications (+) PONV and history of anesthetic complications  Airway Mallampati: II  TM Distance: >3 FB Neck ROM: full    Dental no notable dental hx.    Pulmonary neg pulmonary ROS   Pulmonary exam normal breath sounds clear to auscultation       Cardiovascular Exercise Tolerance: Good hypertension, negative cardio ROS  Rhythm:regular Rate:Normal     Neuro/Psych  PSYCHIATRIC DISORDERS Anxiety Depression    negative neurological ROS  negative psych ROS   GI/Hepatic negative GI ROS, Neg liver ROS, PUD,,,  Endo/Other  negative endocrine ROSHypothyroidism    Renal/GU negative Renal ROS  negative genitourinary   Musculoskeletal   Abdominal   Peds  Hematology negative hematology ROS (+) Blood dyscrasia, anemia   Anesthesia Other Findings   Reproductive/Obstetrics negative OB ROS                             Anesthesia Physical Anesthesia Plan  ASA: 2  Anesthesia Plan: General   Post-op Pain Management:    Induction:   PONV Risk Score and Plan: Propofol infusion  Airway Management Planned:   Additional Equipment:   Intra-op Plan:   Post-operative Plan:   Informed Consent: I have reviewed the patients History and Physical, chart, labs and discussed the procedure including the risks, benefits and alternatives for the proposed anesthesia with the patient or authorized representative who has indicated his/her understanding and acceptance.     Dental Advisory Given  Plan Discussed with: CRNA  Anesthesia Plan Comments:        Anesthesia Quick Evaluation

## 2022-10-21 NOTE — Op Note (Signed)
[facility] Patient Name: [name] Procedure Date: 10/21/2022 8:05 AM MRN: [id number] Date of Birth: 08-20-1960 Attending MD: Katrinka Blazing , , [id number] CSN: [id number] Age: 62 Admit Type: Outpatient Procedure:                Upper GI endoscopy Indications:              Follow-up of post-bariatric anastomotic stenosis,                            Nausea with vomiting Providers:                Katrinka Blazing, Jannett Celestine, RN, Zena Amos Referring MD:              Medicines:                Monitored Anesthesia Care Complications:            No immediate complications. Estimated Blood Loss:     Estimated blood loss: none. Procedure:                Pre-Anesthesia Assessment:                           - Prior to the procedure, a History and Physical                            was performed, and patient medications, allergies                            and sensitivities were reviewed. The patient's                            tolerance of previous anesthesia was reviewed.                           - The risks and benefits of the procedure and the                            sedation options and risks were discussed with the                            patient. All questions were answered and informed                            consent was obtained.                           - ASA Grade Assessment: II - A patient with mild                            systemic disease.                           After obtaining informed consent, the endoscope was                            passed under direct vision. Throughout  the                            procedure, the patient's blood pressure, pulse, and                            oxygen saturations were monitored continuously. The                            [id number]-H190 ([id number]) scope was introduced through the                            mouth, and advanced to the jejunum. The upper GI                            endoscopy was accomplished  without difficulty. The                            patient tolerated the procedure well. Scope In: 8:19:56 AM Scope Out: 8:28:55 AM Total Procedure Duration: 0 hours 8 minutes 59 seconds  Findings:      The examined esophagus was normal.      Evidence of a gastric bypass was found. A gastric pouch with a 6 cm       length from the GE junction to the gastrojejunal anastomosis was found.       The staple line appeared intact. The gastrojejunal anastomosis was       characterized by mild stenosis. This was traversed. The pouch-to-jejunum       limb was characterized by mild stenosis. A TTS dilator was passed       through the scope. Dilation with a 15-16.5-18 mm anastomotic balloon       dilator was performed. Max achieved dilation ws 18 mm. The dilation site       was examined and showed moderate mucosal disruption.      The examined jejunum (afferent and efferent limbs) were normal. Impression:               - Normal esophagus.                           - Gastric bypass with a pouch 6 cm in length and                            intact staple line. Gastrojejunal anastomosis                            characterized by mild stenosis. Dilated.                           - Normal examined jejunum.                           - No specimens collected. Moderate Sedation:      Per Anesthesia Care Recommendation:           - Discharge patient to home (ambulatory).                           -  Resume previous diet. Try to eat smaller portions                            5-6 times per day.                           - Await pathology results.                           - Return to GI clinic as previously scheduled.                           - Continue present medications.                           - No aspirin, ibuprofen, naproxen, or other                            non-steroidal anti-inflammatory drugs. Procedure Code(s):        --- Professional ---                           [phone],  Esophagogastroduodenoscopy, flexible,                            transoral; with dilation of gastric/duodenal                            stricture(s) (eg, balloon, bougie) Diagnosis Code(s):        --- Professional ---                           K95.89, Other complications of other bariatric                            procedure                           R11.2, Nausea with vomiting, unspecified CPT copyright 2022 American Medical Association. All rights reserved. The codes documented in this report are preliminary and upon coder review may  be revised to meet current compliance requirements. Katrinka Blazing, MD Katrinka Blazing,  10/21/2022 8:38:39 AM This report has been signed electronically. Number of Addenda: 0

## 2022-10-21 NOTE — Discharge Instructions (Addendum)
You are being discharged to home.  Resume your previous diet.  We are waiting for your pathology results.  Return to your GI clinic as previously scheduled.  Continue your present medications.  Do not take any aspirin, ibuprofen (including Advil, Motrin or Nuprin), naproxen (including Aleve), or any other non-steroidal anti-inflammatory drugs.

## 2022-10-22 NOTE — Anesthesia Postprocedure Evaluation (Signed)
Anesthesia Post Note  Patient: [name]  Procedure(s) Performed: ESOPHAGOGASTRODUODENOSCOPY (EGD) WITH PROPOFOL ESOPHAGEAL DILATION  Patient location during evaluation: Phase II Anesthesia Type: General Level of consciousness: awake Pain management: pain level controlled Vital Signs Assessment: post-procedure vital signs reviewed and stable Respiratory status: spontaneous breathing and respiratory function stable Cardiovascular status: blood pressure returned to baseline and stable Postop Assessment: no headache and no apparent nausea or vomiting Anesthetic complications: no Comments: Late entry   No notable events documented.   Last Vitals:  Vitals:   10/21/22 0712 10/21/22 0832  BP: (!) 140/85 130/60  Pulse: 65 80  Resp: 18 17  Temp: 36.6 C 36.4 C  SpO2: 100% 100%    Last Pain:  Vitals:   10/21/22 0832  TempSrc: Oral  PainSc: 0-No pain                 Windell Norfolk

## 2022-10-30 ENCOUNTER — Encounter (HOSPITAL_COMMUNITY): Payer: Self-pay | Admitting: Gastroenterology

## 2022-11-09 ENCOUNTER — Ambulatory Visit (INDEPENDENT_AMBULATORY_CARE_PROVIDER_SITE_OTHER): Admitting: Gastroenterology

## 2022-11-09 ENCOUNTER — Encounter (INDEPENDENT_AMBULATORY_CARE_PROVIDER_SITE_OTHER): Payer: Self-pay | Admitting: Gastroenterology

## 2022-11-09 ENCOUNTER — Encounter (HOSPITAL_COMMUNITY): Payer: Self-pay | Admitting: Hematology

## 2022-11-09 VITALS — BP 129/75 | HR 67 | Temp 98.5°F | Ht 64.0 in | Wt 154.4 lb

## 2022-11-09 DIAGNOSIS — Z8711 Personal history of peptic ulcer disease: Secondary | ICD-10-CM | POA: Diagnosis not present

## 2022-11-09 DIAGNOSIS — R112 Nausea with vomiting, unspecified: Secondary | ICD-10-CM | POA: Diagnosis not present

## 2022-11-09 DIAGNOSIS — R6881 Early satiety: Secondary | ICD-10-CM

## 2022-11-09 DIAGNOSIS — K9189 Other postprocedural complications and disorders of digestive system: Secondary | ICD-10-CM

## 2022-11-09 NOTE — Progress Notes (Signed)
Referring Provider: Assunta Found, MD Primary Care Physician:  Assunta Found, MD Primary GI Physician: Dr. Levon Hedger   Chief Complaint  Patient presents with   Nausea    Follow up on nausea. States still has nausea but it is better. Still not having an appetite. Yesterday ate a piece of toast and half of a cheeseburger. Ate a hot dog this morning and vomited it up. Takes phenergan. Patient thought she was suppose to get 2 meds the day of her procedure but only got omeprazole.    HPI:   [name] is a 62 y.o. 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 Appetite down. Having nausea. Not taking PPI. Had not had repeat EGD as recommended   Recommended to schedule EGD, restart omeprazole 40mg  daily, carafate 1g QID  Underwent EGD in July with anastomotic stricture, had repeat again early September   Present:  Patient states she is having some intermittent vomiting. She denies nausea. She is vomiting about 30 minutes after eating. Does not seem to depend on what she eats. She is not vomiting daily. Maybe a couple of times per week. She states in between episodes of vomiting, she feels okay but she is not hungry and does not really ever feel hungry. She has a lot of early satiety even after just a few bites of food. Denies abdominal pain. She is using phenergan PRN when she is having vomiting. She uses pepto bismol PRN if her stomach feels upset. Denies GERD symptoms. She has history of intermittent diarrhea due to her IBS. She tries to be careful about what she eats so as not to aggravate her IBS. She  notes some improvement in symptoms after EGD with dilation of her anastomosis in September, maybe a bit more improved after dilation in July.  She does not wish to go to [facility] for stent placement that has been mentioned in the past and states she would prefer to just have periodic dilations of her anastomotic stricture. She does take tramadol daily due to a lot of back and shoulder pain. No rectal bleeding or melena   Last Colonoscopy:2020 report unavailable   Last Endoscopy:10/2022  - Normal esophagus.                           - Gastric bypass with a pouch 6 cm in length and                            intact staple line. Gastrojejunal anastomosis                            characterized by mild stenosis. Dilated.                           - Normal examined jejunum.                           - No specimens collected.  Recommendations:    Past Medical History:  Diagnosis Date   Depression    started Lexapro when husband passed away   Gastric ulcer 25-Jan-2022   Gastric ulcer 01/2020   Hypertension    Hypothyroidism    IBS (irritable bowel syndrome)    Iron deficiency anemia 04/11/2021   PONV (postoperative nausea and vomiting)     Past Surgical History:  Procedure Laterality Date   ABDOMINAL HYSTERECTOMY  08/11/2011   Procedure: HYSTERECTOMY ABDOMINAL;  Surgeon: [name], MD;  Location: [facility];  Service: Gynecology;  Laterality: N/A;   BIOPSY  01/11/2020   Procedure: BIOPSY;  Surgeon: Corbin Ade, MD;  [facility];  Service: Endoscopy;;   CESAREAN SECTION     x 2   CHOLECYSTECTOMY  2009   [facility]   ESOPHAGEAL DILATION  10/21/2022   Procedure: ESOPHAGEAL DILATION;  Surgeon: Marguerita Merles, Reuel Boom, MD;  [facility];  Service: Gastroenterology;;   ESOPHAGOGASTRODUODENOSCOPY  Jan 2004   RMR: normal esophagus, small hiatal hernia, normal D1, D2   ESOPHAGOGASTRODUODENOSCOPY (EGD) WITH PROPOFOL N/A 01/11/2020   Procedure: ESOPHAGOGASTRODUODENOSCOPY (EGD)  WITH PROPOFOL;  Surgeon: Corbin Ade, MD;  [facility];  Service: Endoscopy;  Laterality: N/A;   ESOPHAGOGASTRODUODENOSCOPY (EGD) WITH PROPOFOL N/A 06/05/2020   Procedure: ESOPHAGOGASTRODUODENOSCOPY (EGD) WITH PROPOFOL;  Surgeon: Malissa Hippo, MD;  [facility];  Service: Endoscopy;  Laterality: N/A;  am   ESOPHAGOGASTRODUODENOSCOPY (EGD) WITH PROPOFOL N/A 12/31/2021   Procedure: ESOPHAGOGASTRODUODENOSCOPY (EGD) WITH PROPOFOL;  Surgeon: Corbin Ade, MD;  [facility];  Service: Endoscopy;  Laterality: N/A;   ESOPHAGOGASTRODUODENOSCOPY (EGD) WITH PROPOFOL N/A 09/08/2022   Procedure: ESOPHAGOGASTRODUODENOSCOPY (EGD) WITH PROPOFOL;  Surgeon: Dolores Frame, MD;  [facility];  Service: Gastroenterology;  Laterality: N/A;  1:00pm;asa 3   ESOPHAGOGASTRODUODENOSCOPY (EGD) WITH PROPOFOL N/A 10/21/2022   Procedure: ESOPHAGOGASTRODUODENOSCOPY (EGD) WITH PROPOFOL;  Surgeon: Dolores Frame, MD;  [facility];  Service: Gastroenterology;  Laterality: N/A;  8:15AM;ASA 1-2   FOOT SURGERY     x 5 right   GASTRIC BYPASS     gastric stent  2020   HAND SURGERY     x2 right   HEMATOMA EVACUATION  08/14/2011   Procedure: EVACUATION HEMATOMA;  Surgeon: [name], MD;  Location: [facility];  Service: Gynecology;  Laterality: N/A;   SALPINGOOPHORECTOMY  08/11/2011   Procedure: SALPINGO OOPHERECTOMY;  Surgeon: [name], MD;  Location: [facility];  Service: Gynecology;  Laterality: Bilateral;   SCAR REVISION  08/11/2011   Procedure: SCAR REVISION;  Surgeon: [name], MD;  Location: [facility];  Service: Gynecology;  Laterality: N/A;  Excision of Wide Cicatrix   SCLEROTHERAPY N/A 12/31/2021   Procedure: SCLEROTHERAPY;  Surgeon: Corbin Ade, MD;  [facility];  Service: Endoscopy;  Laterality: N/A;    Current Outpatient Medications  Medication Sig Dispense Refill   ALPRAZolam (XANAX) 0.5 MG tablet Take 0.5 mg by  mouth See admin instructions. Take 1 tablet (0.5 mg) by mouth scheduled at bedtime for sleep, may take an additional dose (0.5 mg) during the day, if needed for anxiety.     barrier cream (NON-SPECIFIED) CREA Apply 1 Application topically 2 (two) times daily as needed (discomfort/skin breakdown). Fannie barrier cream     bismuth subsalicylate (PEPTO BISMOL) 262 MG chewable tablet Chew 262-524 mg by mouth every 6 (six) hours as needed for diarrhea or loose stools or indigestion.  diphenhydrAMINE (BENADRYL) 25 mg capsule Take 50 mg by mouth in the morning and at bedtime.     levothyroxine (SYNTHROID, LEVOTHROID) 125 MCG tablet Take 125 mcg by mouth daily before breakfast.      omeprazole (PRILOSEC) 40 MG capsule Take 1 capsule (40 mg total) by mouth 2 (two) times daily. 180 capsule 3   Pediatric Multivitamins-Iron (FLINTSTONES COMPLETE) 18 MG CHEW Chew 2 tablets by mouth daily. With iron     promethazine (PHENERGAN) 25 MG tablet Take 25 mg by mouth 3 (three) times daily as needed for vomiting or nausea.     traMADol (ULTRAM) 50 MG tablet Take 50 mg by mouth every 4 (four) hours as needed for pain.     vitamin C (ASCORBIC ACID) 500 MG tablet Take 500 mg by mouth daily in the afternoon.     No current facility-administered medications for this visit.    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   Bee venom Anaphylaxis and Itching 01/12/2020   Hydrocodone-acetaminophen Itching 06/19/2015   Chocolate Other (See Comments) 07/02/2011   Nsaids Nausea And Vomiting 07/31/2011   Synthroid [levothyroxine sodium] Itching and Swelling 06/09/2011   Latex Swelling and Rash 07/07/2011   Neosporin [neomycin-bacitracin zn-polymyx] Rash 07/07/2011    Family History  Problem Relation Age of Onset   Colon cancer Neg Hx     Social History   Socioeconomic History   Marital status: Widowed    Spouse name: Not on file   Number of  children: Not on file   Years of education: Not on file   Highest education level: Not on file  Occupational History   Not on file  Tobacco Use   Smoking status: Never    Passive exposure: Never   Smokeless tobacco: Never  Vaping Use   Vaping status: Never Used  Substance and Sexual Activity   Alcohol use: No   Drug use: No   Sexual activity: Never  Other Topics Concern   Not on file  Social History Narrative   Husband passed away 8 years ago from [employer].    Social Determinants of Health   Financial Resource Strain: Not on file  Food Insecurity: No Food Insecurity (12/30/2021)   Hunger Vital Sign    Worried About Running Out of Food in the Last Year: Never true    Ran Out of Food in the Last Year: Never true  Transportation Needs: No Transportation Needs (12/30/2021)   PRAPARE - Administrator, Civil Service (Medical): No    Lack of Transportation (Non-Medical): No  Physical Activity: Not on file  Stress: Not on file  Social Connections: Not on file    Review of systems General: negative for malaise, night sweats, fever, chills, weight loss Neck: Negative for lumps, goiter, pain and significant neck swelling Resp: Negative for cough, wheezing, dyspnea at rest CV: Negative for chest pain, leg swelling, palpitations, orthopnea GI: denies melena, hematochezia,constipation, dysphagia, odyonophagia, or unintentional weight loss. +vomiting +early satiety  MSK: Negative for joint pain or swelling, back pain, and muscle pain. Derm: Negative for itching or rash Psych: Denies depression, anxiety, memory loss, confusion. No homicidal or suicidal ideation.  Heme: Negative for prolonged bleeding, bruising easily, and swollen nodes. Endocrine: Negative for cold or heat intolerance, polyuria, polydipsia and goiter. Neuro: negative for tremor, gait imbalance, syncope and seizures. The remainder of the review of systems is noncontributory.  Physical Exam: BP  129/75  (BP Location: Left Arm, Patient Position: Sitting, Cuff Size: Normal)   Pulse 67   Temp 98.5 F (36.9 C) (Oral)   Ht 5\' 4"  (1.626 m)   Wt 154 lb 6.4 oz (70 kg)   LMP 08/14/2011   BMI 26.50 kg/m  General:   Alert and oriented. No distress noted. Pleasant and cooperative.  Head:  Normocephalic and atraumatic. Eyes:  Conjuctiva clear without scleral icterus. Mouth:  Oral mucosa pink and moist. Good dentition. No lesions. Heart: Normal rate and rhythm, s1 and s2 heart sounds present.  Lungs: Clear lung sounds in all lobes. Respirations equal and unlabored. Abdomen:  +BS, soft, non-tender and non-distended. No rebound or guarding. No HSM or masses noted. Derm: No palmar erythema or jaundice Msk:  Symmetrical without gross deformities. Normal posture. Extremities:  Without edema. Neurologic:  Alert and  oriented x4 Psych:  Alert and cooperative. Normal mood and affect.  Invalid input(s): "6 MONTHS"   ASSESSMENT: [name] is a 62 y.o. female presenting today for follow up of GJ anastomotic stricture and gastric ulcer  Patient with history of ulcer at GJ anastomosis. last ulcer noted in November. She underwent repeat EGD in July, ulcer healed but with anastomotic stricture, underwent EGD again in September with recurrent stricture, dilated both times. She continues to have early satiety and vomiting, somewhat improved after dilation of stricture. I considered GES to evaluate for underlying gastroparesis contributing to her symptoms, however, at this time, I suspect that her symptoms are secondary to recurrent anastomotic stricture which may be better managed with surgeon who performed her gastric bypass previously. At this time, patient does not wish to see surgeon or have stent placed for her stricture due to previous complications. Will continue with Omeprazole 40mg  BID and phenergan PRN for nausea, she should try to eat smaller meals, avoiding greasy, spicy, heavier foods, may benefit from  dietician referral. I will discuss plan of care, GES vs. Reevaluation by surgeon regarding her RNYGB/recurrent stricture with Dr. Levon Hedger.   PLAN:  Continue omeprazole 40mg  BID  2. Continue phenergan PRN  3. Discuss GES vs. Surgical evaluation for stent placement at anastomosis due to recurrent stricture, with DR. Castaneda  4. Consider dietician referral  5. Try to do 5-6 small meals per day, avoiding greasy, spicy, heavy foods  6. Continue to avoid NSAIDs  All questions were answered, patient verbalized understanding and is in agreement with plan as outlined above.   Follow Up: 4 months   [name] L. Jeanmarie Hubert, MSN, APRN, AGNP-C Adult-Gerontology Nurse Practitioner [facility]  I have reviewed the note and agree with the APP's assessment as described in this progress note  Last endoscopy, she was dilated to a significant size but has persisted with some nausea and vomiting. Unclear if these symptoms are related to stricture, but this has been improving which goes against this. It is possible that given pouch size food is staying for longer. May need to repeat EGD to assess recurrent  stricture and consider possible upper GI series. May also eventually consider bariatric surgery revision of pouch if studies are unremarkable.  Tracy Blazing, MD Gastroenterology and Hepatology [facility]

## 2022-11-09 NOTE — Patient Instructions (Signed)
continue omeprazole 40mg  twice daily Continue phenergan as needed for nausea  Try to do 5-6 smaller meals per day, avoiding greasy, spicy, heavy foods I will discuss gastric emptying study with Dr. Levon Hedger and let you know his recommendations Please avoid NSAIDs (advil, aleve, naproxen, goody powder, ibuprofen) as these can be very hard on your GI tract, causing inflammation, ulcers and damage to the lining of your GI tract.   Follow up 4 months  It was a pleasure to see you today. I want to create trusting relationships with patients and provide genuine, compassionate, and quality care. I truly value your feedback! please be on the lookout for a survey regarding your visit with me today. I appreciate your input about our visit and your time in completing this!    [name] L. Jeanmarie Hubert, MSN, APRN, AGNP-C Adult-Gerontology Nurse Practitioner [facility]astroenter[facility] at [facility]

## 2022-11-17 ENCOUNTER — Telehealth (INDEPENDENT_AMBULATORY_CARE_PROVIDER_SITE_OTHER): Payer: Self-pay | Admitting: Gastroenterology

## 2022-11-17 NOTE — Telephone Encounter (Signed)
Tracy James, NP  Marlowe Shores, LPN Spoke with the patient regarding recommendations for repeat EGD due to ongoing nausea, vomiting and early satiety. She is amenable to proceeding  Kenney Houseman, can we schedule EGD for the patient with Dr C. ASA II, diagnosis nausea/vomiting/early satiety, history of GJ anastomosis stricture

## 2022-12-02 NOTE — Telephone Encounter (Signed)
Contacted pt. Pt scheduled for 01/05/23. Instructions will be mailed once pre op is received. No pa needed

## 2022-12-30 ENCOUNTER — Encounter (HOSPITAL_COMMUNITY)
Admission: RE | Admit: 2022-12-30 | Discharge: 2022-12-30 | Disposition: A | Discharge status: Home / Self Care | Source: Ambulatory Visit | Attending: Gastroenterology | Admitting: Gastroenterology

## 2022-12-31 NOTE — Pre-Procedure Instructions (Signed)
Attempted pre-op phone call. No VM to leave message.

## 2023-01-01 ENCOUNTER — Encounter (HOSPITAL_COMMUNITY): Payer: Self-pay

## 2023-01-01 ENCOUNTER — Other Ambulatory Visit: Payer: Self-pay

## 2023-01-04 NOTE — Anesthesia Preprocedure Evaluation (Signed)
Anesthesia Evaluation  Patient identified by MRN, date of birth, ID band Patient awake    Reviewed: Allergy & Precautions, H&P , NPO status , Patient's Chart, lab work & pertinent test results, reviewed documented beta blocker date and time   History of Anesthesia Complications (+) PONV and history of anesthetic complications  Airway Mallampati: II  TM Distance: >3 FB Neck ROM: full    Dental no notable dental hx. (+) Dental Advisory Given, Teeth Intact   Pulmonary neg pulmonary ROS   Pulmonary exam normal breath sounds clear to auscultation       Cardiovascular Exercise Tolerance: Good hypertension, negative cardio ROS Normal cardiovascular exam Rhythm:regular Rate:Normal     Neuro/Psych  PSYCHIATRIC DISORDERS Anxiety Depression    negative neurological ROS  negative psych ROS   GI/Hepatic Neg liver ROS, PUD,,,  Endo/Other  negative endocrine ROSHypothyroidism    Renal/GU negative Renal ROS  negative genitourinary   Musculoskeletal   Abdominal   Peds  Hematology negative hematology ROS (+) Blood dyscrasia, anemia   Anesthesia Other Findings   Reproductive/Obstetrics negative OB ROS                             Anesthesia Physical Anesthesia Plan  ASA: 2  Anesthesia Plan: General   Post-op Pain Management: Minimal or no pain anticipated   Induction: Intravenous  PONV Risk Score and Plan:   Airway Management Planned: Nasal Cannula and Natural Airway  Additional Equipment: None  Intra-op Plan:   Post-operative Plan:   Informed Consent: I have reviewed the patients History and Physical, chart, labs and discussed the procedure including the risks, benefits and alternatives for the proposed anesthesia with the patient or authorized representative who has indicated his/her understanding and acceptance.     Dental Advisory Given  Plan Discussed with: CRNA  Anesthesia Plan  Comments:        Anesthesia Quick Evaluation

## 2023-01-05 ENCOUNTER — Ambulatory Visit (HOSPITAL_BASED_OUTPATIENT_CLINIC_OR_DEPARTMENT_OTHER): Admitting: Anesthesiology

## 2023-01-05 ENCOUNTER — Encounter (HOSPITAL_COMMUNITY)
Admission: RE | Disposition: A | Discharge status: Home / Self Care | Payer: Self-pay | Source: Home / Self Care | Attending: Gastroenterology

## 2023-01-05 ENCOUNTER — Ambulatory Visit (HOSPITAL_COMMUNITY)
Admission: RE | Admit: 2023-01-05 | Discharge: 2023-01-05 | Disposition: A | Discharge status: Home / Self Care | Attending: Gastroenterology | Admitting: Gastroenterology

## 2023-01-05 ENCOUNTER — Ambulatory Visit (HOSPITAL_COMMUNITY): Admitting: Anesthesiology

## 2023-01-05 ENCOUNTER — Encounter (HOSPITAL_COMMUNITY): Payer: Self-pay | Admitting: Gastroenterology

## 2023-01-05 DIAGNOSIS — K9589 Other complications of other bariatric procedure: Secondary | ICD-10-CM

## 2023-01-05 DIAGNOSIS — F419 Anxiety disorder, unspecified: Secondary | ICD-10-CM | POA: Diagnosis not present

## 2023-01-05 DIAGNOSIS — K9189 Other postprocedural complications and disorders of digestive system: Secondary | ICD-10-CM

## 2023-01-05 DIAGNOSIS — I10 Essential (primary) hypertension: Secondary | ICD-10-CM | POA: Diagnosis not present

## 2023-01-05 DIAGNOSIS — Z8711 Personal history of peptic ulcer disease: Secondary | ICD-10-CM | POA: Insufficient documentation

## 2023-01-05 DIAGNOSIS — E039 Hypothyroidism, unspecified: Secondary | ICD-10-CM | POA: Insufficient documentation

## 2023-01-05 DIAGNOSIS — Z9884 Bariatric surgery status: Secondary | ICD-10-CM | POA: Diagnosis not present

## 2023-01-05 DIAGNOSIS — K589 Irritable bowel syndrome without diarrhea: Secondary | ICD-10-CM | POA: Insufficient documentation

## 2023-01-05 HISTORY — PX: ESOPHAGOGASTRODUODENOSCOPY (EGD) WITH PROPOFOL: SHX5813

## 2023-01-05 HISTORY — PX: BALLOON DILATION: SHX5330

## 2023-01-05 SURGERY — ESOPHAGOGASTRODUODENOSCOPY (EGD) WITH PROPOFOL
Anesthesia: General

## 2023-01-05 MED ORDER — PROPOFOL 10 MG/ML IV BOLUS
INTRAVENOUS | Status: DC | PRN
  Administered 2023-01-05: 100 mg via INTRAVENOUS

## 2023-01-05 MED ORDER — PROPOFOL 500 MG/50ML IV EMUL
INTRAVENOUS | Status: DC | PRN
  Administered 2023-01-05: 150 ug/kg/min via INTRAVENOUS

## 2023-01-05 MED ORDER — LACTATED RINGERS IV SOLN: INTRAVENOUS | Status: DC | PRN

## 2023-01-05 MED ORDER — LIDOCAINE HCL (PF) 2 % IJ SOLN
INTRAMUSCULAR | Status: DC | PRN
  Administered 2023-01-05: 50 mg via INTRADERMAL

## 2023-01-05 NOTE — Op Note (Signed)
[facility] Patient Name: [name] Procedure Date: 01/05/2023 7:19 AM MRN: [id number] Date of Birth: Jan 11, 1961 Attending MD: Katrinka Blazing , , [id number] CSN: [id number] Age: 62 Admit Type: Outpatient Procedure:                Upper GI endoscopy Indications:              Follow-up of post-bariatric anastomotic stenosis Providers:                Katrinka Blazing, Edrick Kins, RN, Zena Amos Referring MD:              Medicines:                Monitored Anesthesia Care Complications:            No immediate complications. Estimated Blood Loss:     Estimated blood loss: none. Procedure:                Pre-Anesthesia Assessment:                           - Prior to the procedure, a History and Physical                            was performed, and patient medications, allergies                            and sensitivities were reviewed. The patient's                            tolerance of previous anesthesia was reviewed.                           - The risks and benefits of the procedure and the                            sedation options and risks were discussed with the                            patient. All questions were answered and informed                            consent was obtained.                           - ASA Grade Assessment: III - A patient with severe                            systemic disease.                           After obtaining informed consent, the endoscope was                            passed under direct vision. Throughout the                            procedure, the  patient's blood pressure, pulse, and                            oxygen saturations were monitored continuously. The                            GIF-H190 ([id number]) scope was introduced through the                            mouth, and advanced to the jejunum. The upper GI                            endoscopy was accomplished without difficulty. The                             patient tolerated the procedure well. Scope In: 9:07:38 AM Scope Out: 9:12:19 AM Total Procedure Duration: 0 hours 4 minutes 41 seconds  Findings:      The esophagus was normal.      Evidence of a gastric bypass was found. A gastric pouch with a normal       size was found. The staple line appeared intact. The gastrojejunal       anastomosis was characterized by edema and mild stenosis. This was       traversed. The pouch-to-jejunum limb was characterized by healthy       appearing mucosa. A TTS dilator was passed through the scope. Dilation       with a 15-16.5-18 mm anastomotic balloon dilator was performed. The       dilation site was examined and showed mild mucosal disruption and       complete resolution of luminal narrowing.      The examined jejunum was normal. Impression:               - Normal esophagus.                           - Gastric bypass with a normal-sized pouch and                            intact staple line. Gastrojejunal anastomosis                            characterized by edema and mild stenosis. Dilated.                           - Normal examined jejunum.                           - No specimens collected. Moderate Sedation:      Per Anesthesia Care Recommendation:           - Discharge patient to home (ambulatory).                           - Resume previous diet.                           - Await pathology  results.                           - Continue present medications. Procedure Code(s):        --- Professional ---                           [phone], Esophagogastroduodenoscopy, flexible,                            transoral; with dilation of gastric/duodenal                            stricture(s) (eg, balloon, bougie) Diagnosis Code(s):        --- Professional ---                           U13.24, Bariatric surgery status                           K95.89, Other complications of other bariatric                            procedure CPT copyright  2022 American Medical Association. All rights reserved. The codes documented in this report are preliminary and upon coder review may  be revised to meet current compliance requirements. Katrinka Blazing, MD Katrinka Blazing,  01/05/2023 9:25:52 AM This report has been signed electronically. Number of Addenda: 0

## 2023-01-05 NOTE — Discharge Instructions (Signed)
You are being discharged to home.  ?Resume your previous diet.  ?We are waiting for your pathology results.  ?Continue your present medications.  ?

## 2023-01-05 NOTE — Transfer of Care (Signed)
Immediate Anesthesia Transfer of Care Note  Patient: [name]  Procedure(s) Performed: ESOPHAGOGASTRODUODENOSCOPY (EGD) WITH PROPOFOL BALLOON DILATION  Patient Location: Short Stay  Anesthesia Type:General  Level of Consciousness: drowsy  Airway & Oxygen Therapy: Patient Spontanous Breathing  Post-op Assessment: Report given to RN and Post -op Vital signs reviewed and stable  Post vital signs: Reviewed and stable  Last Vitals:  Vitals Value Taken Time  BP    Temp    Pulse    Resp    SpO2      Last Pain:  Vitals:   01/05/23 0903  TempSrc:   PainSc: 0-No pain         Complications: No notable events documented.

## 2023-01-05 NOTE — H&P (Signed)
[name] is an 62 y.o. female.   Chief Complaint: gastrojejunal anastomotic stricture HPI: 62 y/o F with PMH depression, GJ anastomotic stricture, IBS, HTN, hypothyroidism, coming for follow-up of gastrojejunal stricture.  Patient reports that she is feeling much better and denies any nausea, vomiting, fever, chills, vaginal pain or distention.  Still with low appetite.  Past Medical History:  Diagnosis Date   Depression    started Lexapro when husband passed away   Gastric ulcer 12/13/2021   Gastric ulcer 01/2020   Hypertension    Hypothyroidism    IBS (irritable bowel syndrome)    Iron deficiency anemia 04/11/2021   PONV (postoperative nausea and vomiting)     Past Surgical History:  Procedure Laterality Date   ABDOMINAL HYSTERECTOMY  08/11/2011   Procedure: HYSTERECTOMY ABDOMINAL;  Surgeon: [name], MD;  [facility]: [facility];  Service: Gynecology;  Laterality: N/A;   BIOPSY  01/11/2020   Procedure: BIOPSY;  Surgeon: Corbin Ade, MD;  [facility];  Service: Endoscopy;;   CESAREAN SECTION     x 2   CHOLECYSTECTOMY  2009   [facility]   ESOPHAGEAL DILATION  10/21/2022   Procedure: ESOPHAGEAL DILATION;  Surgeon: Marguerita Merles, Reuel Boom, MD;  [facility];  Service: Gastroenterology;;   ESOPHAGOGASTRODUODENOSCOPY  Jan 2004   RMR: normal esophagus, small hiatal hernia, normal D1, D2   ESOPHAGOGASTRODUODENOSCOPY (EGD) WITH PROPOFOL N/A 01/11/2020   Procedure: ESOPHAGOGASTRODUODENOSCOPY (EGD) WITH PROPOFOL;  Surgeon: Corbin Ade, MD;  [facility];  Service: Endoscopy;  Laterality: N/A;   ESOPHAGOGASTRODUODENOSCOPY (EGD) WITH PROPOFOL N/A 06/05/2020   Procedure: ESOPHAGOGASTRODUODENOSCOPY (EGD) WITH PROPOFOL;  Surgeon: Malissa Hippo, MD;  [facility];  Service: Endoscopy;  Laterality: N/A;  am   ESOPHAGOGASTRODUODENOSCOPY (EGD) WITH PROPOFOL N/A 12/31/2021   Procedure: ESOPHAGOGASTRODUODENOSCOPY (EGD) WITH PROPOFOL;  Surgeon: Corbin Ade, MD;  [facility];  Service: Endoscopy;  Laterality: N/A;   ESOPHAGOGASTRODUODENOSCOPY (EGD) WITH PROPOFOL N/A 09/08/2022   Procedure: ESOPHAGOGASTRODUODENOSCOPY (EGD) WITH PROPOFOL;  Surgeon: Dolores Frame, MD;  [facility];  Service: Gastroenterology;  Laterality: N/A;  1:00pm;asa 3   ESOPHAGOGASTRODUODENOSCOPY (EGD) WITH PROPOFOL N/A 10/21/2022   Procedure: ESOPHAGOGASTRODUODENOSCOPY (EGD) WITH PROPOFOL;  Surgeon: Dolores Frame, MD;  [facility];  Service: Gastroenterology;  Laterality: N/A;  8:15AM;ASA 1-2   FOOT SURGERY     x 5 right   GASTRIC BYPASS     gastric stent  2020   HAND SURGERY     x2 right   HEMATOMA EVACUATION  08/14/2011   Procedure: EVACUATION HEMATOMA;  Surgeon: [name], MD;  [facility]: [facility];  Service: Gynecology;  Laterality: N/A;   SALPINGOOPHORECTOMY  08/11/2011   Procedure: SALPINGO OOPHERECTOMY;  Surgeon: [name], MD;  Location: [facility];  Service: Gynecology;  Laterality: Bilateral;   SCAR REVISION  08/11/2011   Procedure: SCAR REVISION;  Surgeon: [name], MD;  Location: [facility];  Service: Gynecology;  Laterality: N/A;  Excision of Wide Cicatrix   SCLEROTHERAPY N/A 12/31/2021   Procedure: SCLEROTHERAPY;  Surgeon: Corbin Ade, MD;  [facility];  Service: Endoscopy;  Laterality: N/A;    Family History  Problem Relation Age of Onset   Colon cancer Neg Hx    Social History:  reports that she has never smoked. She has never been exposed to tobacco smoke. She has never used smokeless tobacco. She reports that she does not drink  alcohol and does not use drugs.  Allergies:  Allergies  Allergen Reactions   Bee Pollen Anaphylaxis   Bee Venom Anaphylaxis and Itching    Swelling and itching   Hydrocodone-Acetaminophen Itching   Chocolate Other (See Comments)    Reaction: causes facial blistering,inside mouth   Nsaids Nausea And Vomiting   Synthroid [Levothyroxine  Sodium] Itching and Swelling    Patient has a reaction to brand name synthroid only   Latex Swelling and Rash   Neosporin [Neomycin-Bacitracin Zn-Polymyx] Rash    Medications Prior to Admission  Medication Sig Dispense Refill   ALPRAZolam (XANAX) 0.5 MG tablet Take 0.5 mg by mouth See admin instructions. Take 1 tablet (0.5 mg) by mouth scheduled at bedtime for sleep, may take an additional dose (0.5 mg) during the day, if needed for anxiety.     barrier cream (NON-SPECIFIED) CREA Apply 1 Application topically 2 (two) times daily as needed (discomfort/skin breakdown). Fannie barrier cream     bismuth subsalicylate (PEPTO BISMOL) 262 MG chewable tablet Chew 262-524 mg by mouth every 6 (six) hours as needed for diarrhea or loose stools or indigestion.     diphenhydrAMINE (BENADRYL) 25 mg capsule Take 50 mg by mouth in the morning and at bedtime.     levothyroxine (SYNTHROID, LEVOTHROID) 125 MCG tablet Take 125 mcg by mouth daily before breakfast.      omeprazole (PRILOSEC) 40 MG capsule Take 1 capsule (40 mg total) by mouth 2 (two) times daily. 180 capsule 3   promethazine (PHENERGAN) 25 MG tablet Take 25 mg by mouth 3 (three) times daily as needed for vomiting or nausea.     traMADol (ULTRAM) 50 MG tablet Take 50 mg by mouth every 4 (four) hours as needed for pain.     vitamin C (ASCORBIC ACID) 500 MG tablet Take 500 mg by mouth daily in the afternoon.     Pediatric Multivitamins-Iron (FLINTSTONES COMPLETE) 18 MG CHEW Chew 2 tablets by mouth daily. With iron      No results found for this or any previous visit (from the past 48 hour(s)). No results found.  Review of Systems  All other systems reviewed and are negative.   Blood pressure (!) 113/93, pulse 76, temperature 97.8 F (36.6 C), temperature source Oral, resp. rate 16, height 5\' 4"  (1.626 m), weight 70 kg, last menstrual period 08/14/2011, SpO2 100%. Physical Exam  GENERAL: The patient is AO x3, in no acute distress. HEENT: Head  is normocephalic and atraumatic. EOMI are intact. Mouth is well hydrated and without lesions. NECK: Supple. No masses LUNGS: Clear to auscultation. No presence of rhonchi/wheezing/rales. Adequate chest expansion HEART: RRR, normal s1 and s2. ABDOMEN: Soft, nontender, no guarding, no peritoneal signs, and nondistended. BS +. No masses. EXTREMITIES: Without any cyanosis, clubbing, rash, lesions or edema. NEUROLOGIC: AOx3, no focal motor deficit. SKIN: no jaundice, no rashes  Assessment/Plan  62 y/o F with PMH depression, GJ anastomotic stricture, IBS, HTN, hypothyroidism, coming for follow-up of gastrojejunal stricture.  Will proceed with EGD. Dolores Frame, MD 01/05/2023, 8:51 AM

## 2023-01-05 NOTE — Anesthesia Postprocedure Evaluation (Signed)
Anesthesia Post Note  Patient: [name]  Procedure(s) Performed: ESOPHAGOGASTRODUODENOSCOPY (EGD) WITH PROPOFOL BALLOON DILATION  Patient location during evaluation: PACU Anesthesia Type: General Level of consciousness: awake and alert Pain management: pain level controlled Vital Signs Assessment: post-procedure vital signs reviewed and stable Respiratory status: spontaneous breathing, nonlabored ventilation, respiratory function stable and patient connected to nasal cannula oxygen Cardiovascular status: blood pressure returned to baseline and stable Postop Assessment: no apparent nausea or vomiting Anesthetic complications: no   There were no known notable events for this encounter.   Last Vitals:  Vitals:   01/05/23 0649 01/05/23 0916  BP: (!) 113/93 (!) 107/55  Pulse: 76 78  Resp: 16 (!) 22  Temp: 36.6 C 36.8 C  SpO2: 100% 96%    Last Pain:  Vitals:   01/05/23 0916  TempSrc: Axillary  PainSc: 0-No pain                 [name]

## 2023-01-12 ENCOUNTER — Encounter (HOSPITAL_COMMUNITY): Payer: Self-pay | Admitting: Gastroenterology

## 2023-01-28 ENCOUNTER — Encounter (INDEPENDENT_AMBULATORY_CARE_PROVIDER_SITE_OTHER): Payer: Self-pay | Admitting: Gastroenterology

## 2023-03-11 ENCOUNTER — Encounter (HOSPITAL_COMMUNITY): Payer: Self-pay | Admitting: Hematology

## 2023-03-11 ENCOUNTER — Ambulatory Visit (INDEPENDENT_AMBULATORY_CARE_PROVIDER_SITE_OTHER): Admitting: Gastroenterology

## 2023-03-18 ENCOUNTER — Ambulatory Visit (INDEPENDENT_AMBULATORY_CARE_PROVIDER_SITE_OTHER): Admitting: Gastroenterology

## 2023-03-22 ENCOUNTER — Encounter (HOSPITAL_COMMUNITY): Payer: Self-pay | Admitting: Hematology

## 2023-04-15 ENCOUNTER — Ambulatory Visit (INDEPENDENT_AMBULATORY_CARE_PROVIDER_SITE_OTHER): Payer: Self-pay | Admitting: Gastroenterology

## 2023-04-15 ENCOUNTER — Encounter (INDEPENDENT_AMBULATORY_CARE_PROVIDER_SITE_OTHER): Payer: Self-pay | Admitting: Gastroenterology

## 2023-04-15 ENCOUNTER — Encounter (HOSPITAL_COMMUNITY): Payer: Self-pay | Admitting: Hematology

## 2023-04-15 VITALS — BP 142/75 | HR 77 | Temp 99.1°F | Ht 70.0 in | Wt 170.6 lb

## 2023-04-15 DIAGNOSIS — R112 Nausea with vomiting, unspecified: Secondary | ICD-10-CM | POA: Diagnosis not present

## 2023-04-15 DIAGNOSIS — K9189 Other postprocedural complications and disorders of digestive system: Secondary | ICD-10-CM | POA: Diagnosis not present

## 2023-04-15 NOTE — Progress Notes (Signed)
 [name], M.D. Gastroenterology & Hepatology [facility]enter[facility] [address] [place], [place] [address]  Primary Care Physician: Assunta Found, MD [address] [place] [address]  I will communicate my assessment and recommendations to the referring MD via EMR.  Problems: History of gastrojejunal stricture  History of Present Illness: [name] is a 63 y.o. 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  The patient was last seen on 11/09/2022. At that time, the patient was scheduled to undergo an EGD with findings described below.  Also advised to take Phenergan as needed and to eat smaller meals during the day, consideration for possible dietitian referral.  Patient states that everytime she eats red meat she has nausea and will vomit, so she has avoided this as much as possible. Has only vomited after having a very large meal.  She is currently taking Phenergan BID and omeprazole 40 mg twice a day.   Has not taken any NSAIDs or high dose ASA. Only takes Nyquil.  The patient denies having any  fever, chills, hematochezia, melena, hematemesis, abdominal distention, abdominal pain, diarrhea, jaundice, pruritus.  Patient is 16 lb heavier than before.  Last Colonoscopy:2020 report unavailable  Was recommended to have a repeat colonoscopy in 10 years  Last Endoscopy: 01/05/2023 The esophagus was normal.  Evidence of a gastric bypass was found. A gastric pouch with a normal size was found. The staple line appeared intact. The gastrojejunal anastomosis was characterized by edema and mild stenosis. This was traversed. The pouch- to- jejunum limb was characterized by healthy appearing  mucosa. A TTS dilator was passed through the scope. Dilation with a 15- 16. 5- 18 mm anastomotic balloon dilator was performed. The dilation site was examined and showed mild mucosal disruption and complete resolution of luminal narrowing.  The examined jejunum was normal.  Past Medical History: Past Medical History:  Diagnosis Date   Depression    started Lexapro when husband passed away   Gastric ulcer 01/08/22   Gastric ulcer 01/2020   Hypertension    Hypothyroidism    IBS (irritable bowel syndrome)    Iron deficiency anemia 04/11/2021   PONV (postoperative nausea and vomiting)     Past Surgical History: Past Surgical History:  Procedure Laterality Date   ABDOMINAL HYSTERECTOMY  08/11/2011   Procedure: HYSTERECTOMY ABDOMINAL;  Surgeon: [name], MD;  Location: [facility];  Service: Gynecology;  Laterality: N/A;   BALLOON DILATION  01/05/2023   Procedure: BALLOON DILATION;  Surgeon: Dolores Frame, MD;  [facility];  Service: Gastroenterology;;   BIOPSY  01/11/2020   Procedure: BIOPSY;  Surgeon: Corbin Ade, MD;  [facility];  Service: Endoscopy;;   CESAREAN SECTION     x 2   CHOLECYSTECTOMY  2009   [facility]   ESOPHAGEAL DILATION  10/21/2022   Procedure: ESOPHAGEAL DILATION;  Surgeon: Marguerita Merles, Reuel Boom, MD;  [facility];  Service: Gastroenterology;;   ESOPHAGOGASTRODUODENOSCOPY  Jan 2004   RMR: normal esophagus, small hiatal hernia, normal D1, D2   ESOPHAGOGASTRODUODENOSCOPY (EGD) WITH PROPOFOL N/A 01/11/2020   Procedure: ESOPHAGOGASTRODUODENOSCOPY (EGD) WITH PROPOFOL;  Surgeon: Corbin Ade, MD;  [facility];  Service: Endoscopy;  Laterality: N/A;   ESOPHAGOGASTRODUODENOSCOPY (EGD)  WITH PROPOFOL N/A 06/05/2020   Procedure: ESOPHAGOGASTRODUODENOSCOPY (EGD) WITH PROPOFOL;  Surgeon: Malissa Hippo, MD;  [facility];  Service: Endoscopy;  Laterality: N/A;  am   ESOPHAGOGASTRODUODENOSCOPY (EGD) WITH  PROPOFOL N/A 12/31/2021   Procedure: ESOPHAGOGASTRODUODENOSCOPY (EGD) WITH PROPOFOL;  Surgeon: Corbin Ade, MD;  [facility];  Service: Endoscopy;  Laterality: N/A;   ESOPHAGOGASTRODUODENOSCOPY (EGD) WITH PROPOFOL N/A 09/08/2022   Procedure: ESOPHAGOGASTRODUODENOSCOPY (EGD) WITH PROPOFOL;  Surgeon: Dolores Frame, MD;  [facility];  Service: Gastroenterology;  Laterality: N/A;  1:00pm;asa 3   ESOPHAGOGASTRODUODENOSCOPY (EGD) WITH PROPOFOL N/A 10/21/2022   Procedure: ESOPHAGOGASTRODUODENOSCOPY (EGD) WITH PROPOFOL;  Surgeon: Dolores Frame, MD;  [facility];  Service: Gastroenterology;  Laterality: N/A;  8:15AM;ASA 1-2   ESOPHAGOGASTRODUODENOSCOPY (EGD) WITH PROPOFOL N/A 01/05/2023   Procedure: ESOPHAGOGASTRODUODENOSCOPY (EGD) WITH PROPOFOL;  Surgeon: Dolores Frame, MD;  [facility];  Service: Gastroenterology;  Laterality: N/A;  8:45AM;ASA 2   FOOT SURGERY     x 5 right   GASTRIC BYPASS     gastric stent  2020   HAND SURGERY     x2 right   HEMATOMA EVACUATION  08/14/2011   Procedure: EVACUATION HEMATOMA;  Surgeon: [name], MD;  [facility]: [facility];  Service: Gynecology;  Laterality: N/A;   SALPINGOOPHORECTOMY  08/11/2011   Procedure: SALPINGO OOPHERECTOMY;  Surgeon: [name], MD;  Location: [facility];  Service: Gynecology;  Laterality: Bilateral;   SCAR REVISION  08/11/2011   Procedure: SCAR REVISION;  Surgeon: [name], MD;  Location: [facility];  Service: Gynecology;  Laterality: N/A;  Excision of Wide Cicatrix   SCLEROTHERAPY N/A 12/31/2021   Procedure: SCLEROTHERAPY;  Surgeon: Corbin Ade, MD;  [facility];  Service: Endoscopy;  Laterality: N/A;    Family History: Family History  Problem Relation Age of Onset   Colon cancer Neg Hx     Social History: Social History   Tobacco Use  Smoking Status Never   Passive exposure: Never  Smokeless Tobacco Never   Social History    Substance and Sexual Activity  Alcohol Use No   Social History   Substance and Sexual Activity  Drug Use No    Allergies: Allergies  Allergen Reactions   Bee Pollen Anaphylaxis   Bee Venom Anaphylaxis and Itching    Swelling and itching   Hydrocodone-Acetaminophen Itching   Chocolate Other (See Comments)    Reaction: causes facial blistering,inside mouth   Nsaids Nausea And Vomiting   Synthroid [Levothyroxine Sodium] Itching and Swelling    Patient has a reaction to brand name synthroid only   Latex Swelling and Rash   Neosporin [Neomycin-Bacitracin Zn-Polymyx] Rash    Medications: Current Outpatient Medications  Medication Sig Dispense Refill   ALPRAZolam (XANAX) 0.5 MG tablet Take 0.5 mg by mouth See admin instructions. Take 1 tablet (0.5 mg) by mouth scheduled at bedtime for sleep, may take an additional dose (0.5 mg) during the day, if needed for anxiety.     barrier cream (NON-SPECIFIED) CREA Apply 1 Application topically 2 (two) times daily as needed (discomfort/skin breakdown). Fannie barrier cream     bismuth subsalicylate (PEPTO BISMOL) 262 MG chewable tablet Chew 262-524 mg by mouth every 6 (six) hours as needed for diarrhea or loose stools or indigestion.     diphenhydrAMINE (BENADRYL) 25 mg capsule Take 50 mg by mouth in the morning and at bedtime.     levothyroxine (SYNTHROID) 100  MCG tablet Take 100 mcg by mouth daily before breakfast.     omeprazole (PRILOSEC) 40 MG capsule Take 1 capsule (40 mg total) by mouth 2 (two) times daily. 180 capsule 3   Pediatric Multivitamins-Iron (FLINTSTONES COMPLETE) 18 MG CHEW Chew 2 tablets by mouth daily. With iron     promethazine (PHENERGAN) 25 MG tablet Take 25 mg by mouth 3 (three) times daily as needed for vomiting or nausea.     traMADol (ULTRAM) 50 MG tablet Take 50 mg by mouth every 4 (four) hours as needed for pain.     vitamin C (ASCORBIC ACID) 500 MG tablet Take 500 mg by mouth daily in the afternoon.     No  current facility-administered medications for this visit.    Review of Systems: GENERAL: negative for malaise, night sweats HEENT: No changes in hearing or vision, no nose bleeds or other nasal problems. NECK: Negative for lumps, goiter, pain and significant neck swelling RESPIRATORY: Negative for cough, wheezing CARDIOVASCULAR: Negative for chest pain, leg swelling, palpitations, orthopnea GI: SEE HPI MUSCULOSKELETAL: Negative for joint pain or swelling, back pain, and muscle pain. SKIN: Negative for lesions, rash PSYCH: Negative for sleep disturbance, mood disorder and recent psychosocial stressors. HEMATOLOGY Negative for prolonged bleeding, bruising easily, and swollen nodes. ENDOCRINE: Negative for cold or heat intolerance, polyuria, polydipsia and goiter. NEURO: negative for tremor, gait imbalance, syncope and seizures. The remainder of the review of systems is noncontributory.   Physical Exam: BP (!) 142/75 (BP Location: Right Arm, Patient Position: Sitting, Cuff Size: Normal)   Pulse 77   Temp 99.1 F (37.3 C) (Temporal)   Ht 5\' 10"  (1.778 m)   Wt 170 lb 9.6 oz (77.4 kg)   LMP 08/14/2011   BMI 24.48 kg/m  GENERAL: The patient is AO x3, in no acute distress. HEENT: Head is normocephalic and atraumatic. EOMI are intact. Mouth is well hydrated and without lesions. NECK: Supple. No masses LUNGS: Clear to auscultation. No presence of rhonchi/wheezing/rales. Adequate chest expansion HEART: RRR, normal s1 and s2. ABDOMEN: Soft, nontender, no guarding, no peritoneal signs, and nondistended. BS +. No masses. EXTREMITIES: Without any cyanosis, clubbing, rash, lesions or edema. NEUROLOGIC: AOx3, no focal motor deficit. SKIN: no jaundice, no rashes  Imaging/Labs: as above  I personally reviewed and interpreted the available labs, imaging and endoscopic files.  Impression and Plan: [name] is a 63 y.o. 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  Patient has presented improvement of her symptoms after undergoing most recent dilation and has not presented any issues with failure to thrive.  In fact she has gained some weight as she has been eating more carbs than usual.  We discussed to attend eating smaller portions multiple times per day with a lower intake of carbohydrates and higher intake of protein.  As she has presented some discomfort when eating red meat, we will check for alpha gal.  Will also update her iron panel given her history of iron deficiency anemia.  For now, she should continue with her current dose of omeprazole but we may discuss decreasing her dosage to 20 mg twice daily if symptoms are controlled.  She will continue with Phenergan as needed for nausea.  -Continue omeprazole 40 mg twice daily.  May  consider decreasing omeprazole to 20 mg twice daily dosing in next appointment -Continue with Phenergan twice a day -Eat smaller meals 3-4 times per day, try to work on lower carbohydrate intake -Check CBC, iron stores, alpha gal panel  All questions were answered.      Tracy Blazing, MD Gastroenterology and Hepatology [facility] Gastroenterology

## 2023-04-15 NOTE — Progress Notes (Signed)
 [name]

## 2023-04-15 NOTE — Patient Instructions (Addendum)
 May consider decreasing omeprazole to 20 mg twice daily dosing in next appointment Continue with Phenergan twice a day To 8 smaller meals 3-4 times per day, try to work on lower carbohydrate intake Perform blood workup

## 2023-04-21 LAB — CBC WITH DIFFERENTIAL/PLATELET
Absolute Lymphocytes: 1374 {cells}/uL (ref 850–3900)
Absolute Monocytes: 231 {cells}/uL (ref 200–950)
Basophils Absolute: 31 {cells}/uL (ref 0–200)
Basophils Relative: 0.9 %
Eosinophils Absolute: 82 {cells}/uL (ref 15–500)
Eosinophils Relative: 2.4 %
HCT: 38.3 % (ref 35.0–45.0)
Hemoglobin: 12.2 g/dL (ref 11.7–15.5)
MCH: 29.3 pg (ref 27.0–33.0)
MCHC: 31.9 g/dL — ABNORMAL LOW (ref 32.0–36.0)
MCV: 91.8 fL (ref 80.0–100.0)
MPV: 10 fL (ref 7.5–12.5)
Monocytes Relative: 6.8 %
Neutro Abs: 1683 {cells}/uL (ref 1500–7800)
Neutrophils Relative %: 49.5 %
Platelets: 207 10*3/uL (ref 140–400)
RBC: 4.17 10*6/uL (ref 3.80–5.10)
RDW: 11.9 % (ref 11.0–15.0)
Total Lymphocyte: 40.4 %
WBC: 3.4 10*3/uL — ABNORMAL LOW (ref 3.8–10.8)

## 2023-04-21 LAB — ALPHA-GAL PANEL
Allergen, Mutton, f88: <0.1 kU/L
Allergen, Pork, f26: <0.1 kU/L
Beef: 0.13 kU/L — ABNORMAL HIGH
CLASS: 0
Class: 0
GALACTOSE-ALPHA-1,3-GALACTOSE IGE*: 0.39 kU/L — ABNORMAL HIGH (ref <0.10)

## 2023-04-21 LAB — IRON,TIBC AND FERRITIN PANEL
%SAT: 17 % (ref 16–45)
Ferritin: 12 ng/mL — ABNORMAL LOW (ref 16–288)
Iron: 55 ug/dL (ref 45–160)
TIBC: 330 ug/dL (ref 250–450)

## 2023-04-21 LAB — INTERPRETATION:

## 2023-04-22 ENCOUNTER — Ambulatory Visit: Payer: Self-pay | Admitting: Oncology

## 2023-04-22 NOTE — Progress Notes (Signed)
 Referral sent, they will contact patient with apt

## 2023-04-23 ENCOUNTER — Other Ambulatory Visit: Payer: Self-pay

## 2023-04-23 ENCOUNTER — Inpatient Hospital Stay: Payer: Self-pay | Attending: Oncology

## 2023-04-23 DIAGNOSIS — Z90710 Acquired absence of both cervix and uterus: Secondary | ICD-10-CM | POA: Insufficient documentation

## 2023-04-23 DIAGNOSIS — K912 Postsurgical malabsorption, not elsewhere classified: Secondary | ICD-10-CM

## 2023-04-23 DIAGNOSIS — Z885 Allergy status to narcotic agent status: Secondary | ICD-10-CM | POA: Insufficient documentation

## 2023-04-23 DIAGNOSIS — Z9049 Acquired absence of other specified parts of digestive tract: Secondary | ICD-10-CM | POA: Diagnosis not present

## 2023-04-23 DIAGNOSIS — Z79890 Hormone replacement therapy (postmenopausal): Secondary | ICD-10-CM | POA: Diagnosis not present

## 2023-04-23 DIAGNOSIS — Z9884 Bariatric surgery status: Secondary | ICD-10-CM | POA: Insufficient documentation

## 2023-04-23 DIAGNOSIS — R112 Nausea with vomiting, unspecified: Secondary | ICD-10-CM | POA: Insufficient documentation

## 2023-04-23 DIAGNOSIS — E610 Copper deficiency: Secondary | ICD-10-CM | POA: Insufficient documentation

## 2023-04-23 DIAGNOSIS — R1110 Vomiting, unspecified: Secondary | ICD-10-CM | POA: Diagnosis not present

## 2023-04-23 DIAGNOSIS — D509 Iron deficiency anemia, unspecified: Secondary | ICD-10-CM

## 2023-04-23 DIAGNOSIS — E538 Deficiency of other specified B group vitamins: Secondary | ICD-10-CM

## 2023-04-23 DIAGNOSIS — Z90721 Acquired absence of ovaries, unilateral: Secondary | ICD-10-CM | POA: Diagnosis not present

## 2023-04-23 DIAGNOSIS — K589 Irritable bowel syndrome without diarrhea: Secondary | ICD-10-CM | POA: Insufficient documentation

## 2023-04-23 DIAGNOSIS — Z91030 Bee allergy status: Secondary | ICD-10-CM | POA: Diagnosis not present

## 2023-04-23 DIAGNOSIS — K922 Gastrointestinal hemorrhage, unspecified: Secondary | ICD-10-CM | POA: Insufficient documentation

## 2023-04-23 DIAGNOSIS — R5383 Other fatigue: Secondary | ICD-10-CM | POA: Insufficient documentation

## 2023-04-23 DIAGNOSIS — Z886 Allergy status to analgesic agent status: Secondary | ICD-10-CM | POA: Insufficient documentation

## 2023-04-23 DIAGNOSIS — D649 Anemia, unspecified: Secondary | ICD-10-CM | POA: Insufficient documentation

## 2023-04-23 DIAGNOSIS — K5730 Diverticulosis of large intestine without perforation or abscess without bleeding: Secondary | ICD-10-CM | POA: Diagnosis not present

## 2023-04-23 DIAGNOSIS — Z79899 Other long term (current) drug therapy: Secondary | ICD-10-CM | POA: Diagnosis not present

## 2023-04-23 DIAGNOSIS — E039 Hypothyroidism, unspecified: Secondary | ICD-10-CM | POA: Insufficient documentation

## 2023-04-23 DIAGNOSIS — I10 Essential (primary) hypertension: Secondary | ICD-10-CM | POA: Insufficient documentation

## 2023-04-23 DIAGNOSIS — Z803 Family history of malignant neoplasm of breast: Secondary | ICD-10-CM | POA: Diagnosis not present

## 2023-04-23 LAB — FERRITIN: Ferritin: 11 ng/mL (ref 11–307)

## 2023-04-23 LAB — CBC WITH DIFFERENTIAL/PLATELET
Abs Immature Granulocytes: 0.01 10*3/uL (ref 0.00–0.07)
Basophils Absolute: 0 10*3/uL (ref 0.0–0.1)
Basophils Relative: 1 %
Eosinophils Absolute: 0.1 10*3/uL (ref 0.0–0.5)
Eosinophils Relative: 2 %
HCT: 40.9 % (ref 36.0–46.0)
Hemoglobin: 12.3 g/dL (ref 12.0–15.0)
Immature Granulocytes: 0 %
Lymphocytes Relative: 42 %
Lymphs Abs: 1.5 10*3/uL (ref 0.7–4.0)
MCH: 29 pg (ref 26.0–34.0)
MCHC: 30.1 g/dL (ref 30.0–36.0)
MCV: 96.5 fL (ref 80.0–100.0)
Monocytes Absolute: 0.3 10*3/uL (ref 0.1–1.0)
Monocytes Relative: 8 %
Neutro Abs: 1.7 10*3/uL (ref 1.7–7.7)
Neutrophils Relative %: 47 %
Platelets: 216 10*3/uL (ref 150–400)
RBC: 4.24 MIL/uL (ref 3.87–5.11)
RDW: 12.7 % (ref 11.5–15.5)
WBC: 3.6 10*3/uL — ABNORMAL LOW (ref 4.0–10.5)
nRBC: 0 % (ref 0.0–0.2)

## 2023-04-23 LAB — IRON AND TIBC
Iron: 48 ug/dL (ref 28–170)
Saturation Ratios: 12 % (ref 10.4–31.8)
TIBC: 387 ug/dL (ref 250–450)
UIBC: 339 ug/dL

## 2023-04-23 LAB — VITAMIN B12: Vitamin B-12: 211 pg/mL (ref 180–914)

## 2023-04-24 LAB — COPPER, SERUM: Copper: 94 ug/dL (ref 80–158)

## 2023-04-26 LAB — METHYLMALONIC ACID, SERUM: Methylmalonic Acid, Quantitative: 157 nmol/L (ref 0–378)

## 2023-04-28 NOTE — Progress Notes (Unsigned)
 [facility] [address], [place] [address]   CLINIC:  Medical Oncology/Hematology  PCP:  Assunta Found, MD [address] [place] [address] [phone]   REASON FOR VISIT:  Follow-up for normocytic anemia  PRIOR THERAPY: PRBC transfusion x4 (December 2021)  CURRENT THERAPY: IV iron, oral copper supplement  INTERVAL HISTORY:  [name] 63 y.o. female returns for routine follow-up of her normocytic anemia.  She was last seen by Elizebeth Koller PA-C on 07/18/21.   Patient has past medical history significant for gastric bypass PUD, acute GI bleed secondary to ulcer and hypothyroidism.  She was evaluated on several occasions since her last visit with [place].  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  She had EGD by Dr. Jena Gauss and was started on IV Protonix and switch to omeprazole along with Carafate 4 times daily.  Hemoglobin stable around 8.7.  Did not require blood products.   She had upper endoscopy outpatient on 09/08/2022, 10/21/2022 and 01/05/23 with Dr. 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  She denies any additional bleeding.  At today's visit, she reports feeling well.  She last received IV Feraheme on 04/14/2021 and 04/21/2021.  She denies any bright of blood per rectum, melena, or epistaxis.  Her ice pica has improved.  She denies any restless legs, headaches, chest pain, dyspnea on exertion, lightheadedness, or syncope.  She has 90% energy and 100% appetite. She endorses that she is maintaining a stable weight.   REVIEW OF SYSTEMS:    Review of Systems - Oncology    PAST MEDICAL/SURGICAL HISTORY:  Past Medical History:  Diagnosis Date   Depression    started Lexapro when husband passed away   Gastric ulcer 01/06/2022   Gastric ulcer 01/2020   Hypertension    Hypothyroidism    IBS  (irritable bowel syndrome)    Iron deficiency anemia 04/11/2021   PONV (postoperative nausea and vomiting)    Past Surgical History:  Procedure Laterality Date   ABDOMINAL HYSTERECTOMY  08/11/2011   Procedure: HYSTERECTOMY ABDOMINAL;  Surgeon: [name], MD;  Location: [facility];  Service: Gynecology;  Laterality: N/A;   BALLOON DILATION  01/05/2023   Procedure: BALLOON DILATION;  Surgeon: Dolores Frame, MD;  [facility];  Service: Gastroenterology;;   BIOPSY  01/11/2020   Procedure: BIOPSY;  Surgeon: Corbin Ade, MD;  [facility];  Service: Endoscopy;;   CESAREAN SECTION     x 2   CHOLECYSTECTOMY  2009   [facility]   ESOPHAGEAL DILATION  10/21/2022   Procedure: ESOPHAGEAL DILATION;  Surgeon: Marguerita Merles, Reuel Boom, MD;  [facility];  Service: Gastroenterology;;   ESOPHAGOGASTRODUODENOSCOPY  Jan 2004   RMR: normal esophagus, small hiatal hernia, normal D1, D2   ESOPHAGOGASTRODUODENOSCOPY (EGD) WITH PROPOFOL N/A 01/11/2020   Procedure: ESOPHAGOGASTRODUODENOSCOPY (EGD) WITH PROPOFOL;  Surgeon: Corbin Ade, MD;  [facility];  Service: Endoscopy;  Laterality: N/A;   ESOPHAGOGASTRODUODENOSCOPY (EGD) WITH PROPOFOL N/A 06/05/2020   Procedure: ESOPHAGOGASTRODUODENOSCOPY (EGD) WITH PROPOFOL;  Surgeon: Malissa Hippo, MD;  [facility];  Service: Endoscopy;  Laterality: N/A;  am   ESOPHAGOGASTRODUODENOSCOPY (EGD) WITH PROPOFOL N/A 12/31/2021   Procedure: ESOPHAGOGASTRODUODENOSCOPY (EGD) WITH PROPOFOL;  Surgeon: Corbin Ade, MD;  [facility];  Service: Endoscopy;  Laterality: N/A;   ESOPHAGOGASTRODUODENOSCOPY (EGD) WITH PROPOFOL  N/A 09/08/2022   Procedure: ESOPHAGOGASTRODUODENOSCOPY (EGD) WITH PROPOFOL;  Surgeon: Dolores Frame, MD;  [facility];  Service: Gastroenterology;  Laterality: N/A;  1:00pm;asa 3   ESOPHAGOGASTRODUODENOSCOPY (EGD) WITH PROPOFOL N/A 10/21/2022   Procedure:  ESOPHAGOGASTRODUODENOSCOPY (EGD) WITH PROPOFOL;  Surgeon: Dolores Frame, MD;  [facility];  Service: Gastroenterology;  Laterality: N/A;  8:15AM;ASA 1-2   ESOPHAGOGASTRODUODENOSCOPY (EGD) WITH PROPOFOL N/A 01/05/2023   Procedure: ESOPHAGOGASTRODUODENOSCOPY (EGD) WITH PROPOFOL;  Surgeon: Dolores Frame, MD;  [facility];  Service: Gastroenterology;  Laterality: N/A;  8:45AM;ASA 2   FOOT SURGERY     x 5 right   GASTRIC BYPASS     gastric stent  2020   HAND SURGERY     x2 right   HEMATOMA EVACUATION  08/14/2011   Procedure: EVACUATION HEMATOMA;  Surgeon: [name], MD;  [facility]: [facility];  Service: Gynecology;  Laterality: N/A;   SALPINGOOPHORECTOMY  08/11/2011   Procedure: SALPINGO OOPHERECTOMY;  Surgeon: [name], MD;  [facility]: [facility];  Service: Gynecology;  Laterality: Bilateral;   SCAR REVISION  08/11/2011   Procedure: SCAR REVISION;  Surgeon: [name], MD;  [facility]: [facility];  Service: Gynecology;  Laterality: N/A;  Excision of Wide Cicatrix   SCLEROTHERAPY N/A 12/31/2021   Procedure: SCLEROTHERAPY;  Surgeon: Corbin Ade, MD;  [facility];  Service: Endoscopy;  Laterality: N/A;     SOCIAL HISTORY:  Social History   Socioeconomic History   Marital status: Widowed    Spouse name: Not on file   Number of children: Not on file   Years of education: Not on file   Highest education level: Not on file  Occupational History   Not on file  Tobacco Use   Smoking status: Never    Passive exposure: Never   Smokeless tobacco: Never  Vaping Use   Vaping status: Never Used  Substance and Sexual Activity   Alcohol use: No   Drug use: No   Sexual activity: Never  Other Topics Concern   Not on file  Social History Narrative   Husband passed away 8 years ago from [employer].    Social Drivers of Corporate investment banker Strain: Not on file  Food Insecurity: No Food Insecurity (12/30/2021)   Hunger Vital  Sign    Worried About Running Out of Food in the Last Year: Never true    Ran Out of Food in the Last Year: Never true  Transportation Needs: No Transportation Needs (12/30/2021)   PRAPARE - Administrator, Civil Service (Medical): No    Lack of Transportation (Non-Medical): No  Physical Activity: Not on file  Stress: Not on file  Social Connections: Not on file  Intimate Partner Violence: Not At Risk (12/30/2021)   Humiliation, Afraid, Rape, and Kick questionnaire    Fear of Current or Ex-Partner: No    Emotionally Abused: No    Physically Abused: No    Sexually Abused: No    FAMILY HISTORY:  Family History  Problem Relation Age of Onset   Colon cancer Neg Hx     CURRENT MEDICATIONS:  Outpatient Encounter Medications as of 04/29/2023  Medication Sig   ALPRAZolam (XANAX) 0.5 MG tablet Take 0.5 mg by mouth See admin instructions. Take 1 tablet (0.5 mg) by mouth scheduled at bedtime for sleep, may take an additional dose (0.5 mg) during the day, if needed for anxiety.   barrier cream (NON-SPECIFIED)  CREA Apply 1 Application topically 2 (two) times daily as needed (discomfort/skin breakdown). Fannie barrier cream   bismuth subsalicylate (PEPTO BISMOL) 262 MG chewable tablet Chew 262-524 mg by mouth every 6 (six) hours as needed for diarrhea or loose stools or indigestion.   diphenhydrAMINE (BENADRYL) 25 mg capsule Take 50 mg by mouth in the morning and at bedtime.   levothyroxine (SYNTHROID) 100 MCG tablet Take 100 mcg by mouth daily before breakfast.   omeprazole (PRILOSEC) 40 MG capsule Take 1 capsule (40 mg total) by mouth 2 (two) times daily.   Pediatric Multivitamins-Iron (FLINTSTONES COMPLETE) 18 MG CHEW Chew 2 tablets by mouth daily. With iron   promethazine (PHENERGAN) 25 MG tablet Take 25 mg by mouth 3 (three) times daily as needed for vomiting or nausea.   traMADol (ULTRAM) 50 MG tablet Take 50 mg by mouth every 4 (four) hours as needed for pain.   vitamin C  (ASCORBIC ACID) 500 MG tablet Take 500 mg by mouth daily in the afternoon.   No facility-administered encounter medications on file as of 04/29/2023.    ALLERGIES:  Allergies  Allergen Reactions   Bee Pollen Anaphylaxis   Bee Venom Anaphylaxis and Itching    Swelling and itching   Hydrocodone-Acetaminophen Itching   Chocolate Other (See Comments)    Reaction: causes facial blistering,inside mouth   Nsaids Nausea And Vomiting   Synthroid [Levothyroxine Sodium] Itching and Swelling    Patient has a reaction to brand name synthroid only   Latex Swelling and Rash   Neosporin [Neomycin-Bacitracin Zn-Polymyx] Rash     PHYSICAL EXAM:  ECOG PERFORMANCE STATUS: 0 - Asymptomatic    There were no vitals filed for this visit. There were no vitals filed for this visit. Physical Exam   LABORATORY DATA:  I have reviewed the labs as listed.  CBC    Component Value Date/Time   WBC 3.6 (L) 04/23/2023 0941   RBC 4.24 04/23/2023 0941   HGB 12.3 04/23/2023 0941   HCT 40.9 04/23/2023 0941   PLT 216 04/23/2023 0941   MCV 96.5 04/23/2023 0941   MCH 29.0 04/23/2023 0941   MCHC 30.1 04/23/2023 0941   RDW 12.7 04/23/2023 0941   LYMPHSABS 1.5 04/23/2023 0941   MONOABS 0.3 04/23/2023 0941   EOSABS 0.1 04/23/2023 0941   BASOSABS 0.0 04/23/2023 0941      Latest Ref Rng & Units 01/08/2022   10:02 AM 01/02/2022    3:28 AM 01/01/2022    3:36 AM  CMP  Glucose 70 - 99 mg/dL  88  87   BUN 8 - 23 mg/dL  <5  5   Creatinine 8.65 - 1.00 mg/dL  7.84  6.96   Sodium 295 - 145 mmol/L  142  141   Potassium 3.5 - 5.1 mmol/L  3.5  3.1   Chloride 98 - 111 mmol/L  110  113   CO2 22 - 32 mmol/L  27  25   Calcium 8.9 - 10.3 mg/dL  8.6  8.0   Total Protein 6.0 - 8.5 g/dL 5.6     Total Bilirubin 0.0 - 1.2 mg/dL 0.2     Alkaline Phos 44 - 121 IU/L 221     AST 0 - 40 IU/L 14     ALT 0 - 32 IU/L 13       DIAGNOSTIC IMAGING:  I have independently reviewed the relevant imaging and discussed with the  patient.   ASSESSMENT & PLAN: 1.  Normocytic anemia in  the setting of gastric bypass surgery, iron deficiency, and copper deficiency: - Patient seen at the request of Dr. Phillips Odor.  CBC on 11/18/2020 hemoglobin 10.3 with MCV 92.  Normal white count and platelet count. - History of gastric bypass in 2013, successfully lost 200+ pounds. - Received 4 units of PRBC in December 2021 when EGD showed anastomotic ulceration at the gastrojejunostomy site. - Repeat EGD on 06/05/2020 showed normal esophagus with healed anastomotic ulcer and normal jejunum. - Colonoscopy on file on 07/14/2011 with mild diverticulosis in descending and sigmoid colon. - Patient reportedly underwent colonoscopy at [facility] in 2020 which was normal. 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  Elevated B12 at 2816, methylmalonic acid pending Normal reticulocytes, LDH, SPEP. Hemoccult stool x3 NEGATIVE - She takes iron pills daily.  She takes B12 3000 mcg every other day  - She forgot to start taking copper supplement - IV Feraheme x2 on 04/14/2021 and 04/21/2021 (with PREMEDICATION due to history of multiple medication allergies) - Denies any bleeding per rectum or melena.   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 - Most recent labs (07/11/2021): Hgb 13.7/MCV 93.6, ferritin 483, iron saturation 25%.  Vitamin B12 1944 with methylmalonic acid 76.  Copper remains mildly low at 67. - PLAN: No indication for IV iron at this time. -Reminded patient to start taking OTC copper supplement 2 mg twice daily (or 5 mg daily). - She has been taking vitamin B12 3000 mcg every other day- instructed to decrease dose to 3000 mcg twice weekly  - Repeat labs and RTC in 3 months.  (Phone visit okay if patient prefers)    2.  Social/family history: -  She is independent of all ADLs and IADLs.  She worked in [employer].  She also worked at [employer] for few years and exposed to dyes.  No history of smoking. - Mother had breast cancer.  Maternal aunt had cancer.  Maternal cousin has colorectal cancer.  PLAN: 1. Iron deficiency anemia, unspecified iron deficiency anemia type (Primary) -She received 2 doses of IV Feraheme on 04/14/2021 and 04/21/2021.    2. Vitamin B12 deficiency ***   PLAN SUMMARY & DISPOSITION: Labs in 3 months Phone visit 1 week after labs  All questions were answered. The patient knows to call the clinic with any problems, questions or concerns.  Medical decision making: Moderate    Time spent on visit: I spent 20 minutes counseling the patient face to face. The total time spent in the appointment was 30 minutes and more than 50% was on counseling.   Mauro Kaufmann, NP  07/18/2021 8:46 AM

## 2023-04-29 ENCOUNTER — Inpatient Hospital Stay (HOSPITAL_BASED_OUTPATIENT_CLINIC_OR_DEPARTMENT_OTHER): Payer: Self-pay | Admitting: Oncology

## 2023-04-29 VITALS — BP 135/69 | HR 73 | Resp 18 | Ht 70.0 in | Wt 169.0 lb

## 2023-04-29 DIAGNOSIS — Z903 Acquired absence of stomach [part of]: Secondary | ICD-10-CM

## 2023-04-29 DIAGNOSIS — K912 Postsurgical malabsorption, not elsewhere classified: Secondary | ICD-10-CM | POA: Diagnosis not present

## 2023-04-29 DIAGNOSIS — E610 Copper deficiency: Secondary | ICD-10-CM | POA: Diagnosis not present

## 2023-04-29 DIAGNOSIS — D509 Iron deficiency anemia, unspecified: Secondary | ICD-10-CM

## 2023-04-29 DIAGNOSIS — E538 Deficiency of other specified B group vitamins: Secondary | ICD-10-CM

## 2023-04-29 DIAGNOSIS — D649 Anemia, unspecified: Secondary | ICD-10-CM | POA: Diagnosis not present

## 2023-05-03 ENCOUNTER — Inpatient Hospital Stay: Payer: Self-pay

## 2023-05-03 VITALS — BP 144/80 | HR 67 | Temp 96.7°F | Resp 18

## 2023-05-03 DIAGNOSIS — D649 Anemia, unspecified: Secondary | ICD-10-CM | POA: Diagnosis not present

## 2023-05-03 DIAGNOSIS — D509 Iron deficiency anemia, unspecified: Secondary | ICD-10-CM

## 2023-05-03 MED ORDER — CETIRIZINE HCL 10 MG/ML IV SOLN
10.0000 mg | Freq: Once | INTRAVENOUS | Status: AC
  Administered 2023-05-03: 10 mg via INTRAVENOUS
  Filled 2023-05-03: qty 1

## 2023-05-03 MED ORDER — ACETAMINOPHEN 325 MG PO TABS
650.0000 mg | ORAL_TABLET | Freq: Once | ORAL | Status: AC
Start: 2023-05-03 — End: 2023-05-03
  Administered 2023-05-03: 650 mg via ORAL
  Filled 2023-05-03: qty 2

## 2023-05-03 MED ORDER — SODIUM CHLORIDE 0.9 % IV SOLN
510.0000 mg | Freq: Once | INTRAVENOUS | Status: AC
  Administered 2023-05-03: 510 mg via INTRAVENOUS
  Filled 2023-05-03: qty 510

## 2023-05-03 MED ORDER — METHYLPREDNISOLONE SODIUM SUCC 125 MG IJ SOLR
125.0000 mg | Freq: Once | INTRAMUSCULAR | Status: AC
Start: 2023-05-03 — End: 2023-05-03
  Administered 2023-05-03: 125 mg via INTRAVENOUS
  Filled 2023-05-03: qty 2

## 2023-05-03 MED ORDER — SODIUM CHLORIDE 0.9 % IV SOLN: Freq: Once | INTRAVENOUS | Status: AC

## 2023-05-03 MED ORDER — FAMOTIDINE IN NACL 20-0.9 MG/50ML-% IV SOLN
20.0000 mg | Freq: Once | INTRAVENOUS | Status: AC
  Administered 2023-05-03: 20 mg via INTRAVENOUS
  Filled 2023-05-03: qty 50

## 2023-05-03 NOTE — Patient Instructions (Signed)
 [facility] - A DEPT OF [facility]  Discharge Instructions: Thank you for choosing [facility] to provide your oncology and hematology care.  If you have a lab appointment with the Cancer Center - please note that after April 8th, 2024, all labs will be drawn in the cancer center.  You do not have to check in or register with the main entrance as you have in the past but will complete your check-in in the cancer center.  Wear comfortable clothing and clothing appropriate for easy access to any Portacath or PICC line.   We strive to give you quality time with your provider. You may need to reschedule your appointment if you arrive late (15 or more minutes).  Arriving late affects you and other patients whose appointments are after yours.  Also, if you miss three or more appointments without notifying the office, you may be dismissed from the clinic at the provider's discretion.      For prescription refill requests, have your pharmacy contact our office and allow 72 hours for refills to be completed.    Today you received the following Feraheme iron infusion   To help prevent nausea and vomiting after your treatment, we encourage you to take your nausea medication as directed.  BELOW ARE SYMPTOMS THAT SHOULD BE REPORTED IMMEDIATELY: *FEVER GREATER THAN 100.4 F (38 C) OR HIGHER *CHILLS OR SWEATING *NAUSEA AND VOMITING THAT IS NOT CONTROLLED WITH YOUR NAUSEA MEDICATION *UNUSUAL SHORTNESS OF BREATH *UNUSUAL BRUISING OR BLEEDING *URINARY PROBLEMS (pain or burning when urinating, or frequent urination) *BOWEL PROBLEMS (unusual diarrhea, constipation, pain near the anus) TENDERNESS IN MOUTH AND THROAT WITH OR WITHOUT PRESENCE OF ULCERS (sore throat, sores in mouth, or a toothache) UNUSUAL RASH, SWELLING OR PAIN  UNUSUAL VAGINAL DISCHARGE OR ITCHING   Items with * indicate a potential emergency and should be followed up as soon as possible or go to  the Emergency Department if any problems should occur.  Please show the CHEMOTHERAPY ALERT CARD or IMMUNOTHERAPY ALERT CARD at check-in to the Emergency Department and triage nurse.  Should you have questions after your visit or need to cancel or reschedule your appointment, please contact [facility] [place] - A DEPT OF [facility] [phone]  and follow the prompts.  Office hours are 8:00 a.m. to 4:30 p.m. Monday - Friday. Please note that voicemails left after 4:00 p.m. may not be returned until the following business day.  We are closed weekends and major holidays. You have access to a nurse at all times for urgent questions. Please call the main number to the clinic [phone] and follow the prompts.  For any non-urgent questions, you may also contact your provider using MyChart. We now offer e-Visits for anyone 90 and older to request care online for non-urgent symptoms. For details visit [email or web address].   Also download the MyChart app! Go to the app store, search "MyChart", open the app, select [place], and log in with your MyChart username and password.

## 2023-05-03 NOTE — Progress Notes (Signed)
 Feraheme iron given per orders. Patient tolerated it well without problems. Vitals stable and discharged home from clinic ambulatory. Follow up as scheduled.

## 2023-05-10 ENCOUNTER — Inpatient Hospital Stay: Payer: Self-pay

## 2023-05-10 VITALS — BP 134/69 | HR 84 | Temp 96.7°F | Resp 18 | Wt 173.0 lb

## 2023-05-10 DIAGNOSIS — D649 Anemia, unspecified: Secondary | ICD-10-CM | POA: Diagnosis not present

## 2023-05-10 DIAGNOSIS — D509 Iron deficiency anemia, unspecified: Secondary | ICD-10-CM

## 2023-05-10 MED ORDER — CETIRIZINE HCL 10 MG/ML IV SOLN
10.0000 mg | Freq: Once | INTRAVENOUS | Status: AC
  Administered 2023-05-10: 10 mg via INTRAVENOUS
  Filled 2023-05-10: qty 1

## 2023-05-10 MED ORDER — METHYLPREDNISOLONE SODIUM SUCC 125 MG IJ SOLR
125.0000 mg | Freq: Once | INTRAMUSCULAR | Status: AC
Start: 2023-05-10 — End: 2023-05-10
  Administered 2023-05-10: 125 mg via INTRAVENOUS
  Filled 2023-05-10: qty 2

## 2023-05-10 MED ORDER — SODIUM CHLORIDE 0.9 % IV SOLN: Freq: Once | INTRAVENOUS | Status: AC

## 2023-05-10 MED ORDER — ACETAMINOPHEN 325 MG PO TABS
650.0000 mg | ORAL_TABLET | Freq: Once | ORAL | Status: AC
  Administered 2023-05-10: 650 mg via ORAL
  Filled 2023-05-10: qty 2

## 2023-05-10 MED ORDER — SODIUM CHLORIDE 0.9 % IV SOLN
510.0000 mg | Freq: Once | INTRAVENOUS | Status: AC
  Administered 2023-05-10: 510 mg via INTRAVENOUS
  Filled 2023-05-10: qty 510

## 2023-05-10 MED ORDER — FAMOTIDINE IN NACL 20-0.9 MG/50ML-% IV SOLN
20.0000 mg | Freq: Once | INTRAVENOUS | Status: AC
  Administered 2023-05-10: 20 mg via INTRAVENOUS
  Filled 2023-05-10: qty 50

## 2023-05-10 NOTE — Progress Notes (Signed)
 Patient tolerated iron infusion with no complaints voiced.  Peripheral IV site clean and dry with good blood return noted before and after infusion.  Band aid applied.  VSS with discharge and left in satisfactory condition with no s/s of distress noted.

## 2023-05-10 NOTE — Patient Instructions (Signed)

## 2023-07-28 ENCOUNTER — Inpatient Hospital Stay: Attending: Oncology

## 2023-07-28 DIAGNOSIS — G479 Sleep disorder, unspecified: Secondary | ICD-10-CM | POA: Insufficient documentation

## 2023-07-28 DIAGNOSIS — R5383 Other fatigue: Secondary | ICD-10-CM | POA: Insufficient documentation

## 2023-07-28 DIAGNOSIS — K5730 Diverticulosis of large intestine without perforation or abscess without bleeding: Secondary | ICD-10-CM | POA: Insufficient documentation

## 2023-07-28 DIAGNOSIS — K56699 Other intestinal obstruction unspecified as to partial versus complete obstruction: Secondary | ICD-10-CM | POA: Insufficient documentation

## 2023-07-28 DIAGNOSIS — E610 Copper deficiency: Secondary | ICD-10-CM | POA: Diagnosis not present

## 2023-07-28 DIAGNOSIS — Z885 Allergy status to narcotic agent status: Secondary | ICD-10-CM | POA: Insufficient documentation

## 2023-07-28 DIAGNOSIS — Z90710 Acquired absence of both cervix and uterus: Secondary | ICD-10-CM | POA: Diagnosis not present

## 2023-07-28 DIAGNOSIS — Z886 Allergy status to analgesic agent status: Secondary | ICD-10-CM | POA: Diagnosis not present

## 2023-07-28 DIAGNOSIS — D509 Iron deficiency anemia, unspecified: Secondary | ICD-10-CM | POA: Insufficient documentation

## 2023-07-28 DIAGNOSIS — M7989 Other specified soft tissue disorders: Secondary | ICD-10-CM | POA: Diagnosis not present

## 2023-07-28 DIAGNOSIS — K648 Other hemorrhoids: Secondary | ICD-10-CM | POA: Insufficient documentation

## 2023-07-28 DIAGNOSIS — Z79890 Hormone replacement therapy (postmenopausal): Secondary | ICD-10-CM | POA: Diagnosis not present

## 2023-07-28 DIAGNOSIS — Z90721 Acquired absence of ovaries, unilateral: Secondary | ICD-10-CM | POA: Diagnosis not present

## 2023-07-28 DIAGNOSIS — K912 Postsurgical malabsorption, not elsewhere classified: Secondary | ICD-10-CM

## 2023-07-28 DIAGNOSIS — Z809 Family history of malignant neoplasm, unspecified: Secondary | ICD-10-CM | POA: Diagnosis not present

## 2023-07-28 DIAGNOSIS — D649 Anemia, unspecified: Secondary | ICD-10-CM | POA: Diagnosis present

## 2023-07-28 DIAGNOSIS — Z9884 Bariatric surgery status: Secondary | ICD-10-CM | POA: Insufficient documentation

## 2023-07-28 DIAGNOSIS — Z800 Family history of malignant neoplasm of digestive organs: Secondary | ICD-10-CM | POA: Diagnosis not present

## 2023-07-28 DIAGNOSIS — Z803 Family history of malignant neoplasm of breast: Secondary | ICD-10-CM | POA: Diagnosis not present

## 2023-07-28 DIAGNOSIS — E538 Deficiency of other specified B group vitamins: Secondary | ICD-10-CM

## 2023-07-28 DIAGNOSIS — Z9049 Acquired absence of other specified parts of digestive tract: Secondary | ICD-10-CM | POA: Insufficient documentation

## 2023-07-28 DIAGNOSIS — Z79899 Other long term (current) drug therapy: Secondary | ICD-10-CM | POA: Insufficient documentation

## 2023-07-28 LAB — CBC WITH DIFFERENTIAL/PLATELET
Abs Immature Granulocytes: 0.01 10*3/uL (ref 0.00–0.07)
Basophils Absolute: 0 10*3/uL (ref 0.0–0.1)
Basophils Relative: 1 %
Eosinophils Absolute: 0.1 10*3/uL (ref 0.0–0.5)
Eosinophils Relative: 2 %
HCT: 44.6 % (ref 36.0–46.0)
Hemoglobin: 13.9 g/dL (ref 12.0–15.0)
Immature Granulocytes: 0 %
Lymphocytes Relative: 27 %
Lymphs Abs: 1.5 10*3/uL (ref 0.7–4.0)
MCH: 29.4 pg (ref 26.0–34.0)
MCHC: 31.2 g/dL (ref 30.0–36.0)
MCV: 94.5 fL (ref 80.0–100.0)
Monocytes Absolute: 0.4 10*3/uL (ref 0.1–1.0)
Monocytes Relative: 7 %
Neutro Abs: 3.5 10*3/uL (ref 1.7–7.7)
Neutrophils Relative %: 63 %
Platelets: 215 10*3/uL (ref 150–400)
RBC: 4.72 MIL/uL (ref 3.87–5.11)
RDW: 12.4 % (ref 11.5–15.5)
WBC: 5.5 10*3/uL (ref 4.0–10.5)
nRBC: 0 % (ref 0.0–0.2)

## 2023-07-28 LAB — VITAMIN B12: Vitamin B-12: 296 pg/mL (ref 180–914)

## 2023-07-28 LAB — IRON AND TIBC
Iron: 65 ug/dL (ref 28–170)
Saturation Ratios: 29 % (ref 10.4–31.8)
TIBC: 226 ug/dL — ABNORMAL LOW (ref 250–450)
UIBC: 161 ug/dL

## 2023-07-28 LAB — FERRITIN: Ferritin: 146 ng/mL (ref 11–307)

## 2023-07-30 LAB — METHYLMALONIC ACID, SERUM: Methylmalonic Acid, Quantitative: 141 nmol/L (ref 0–378)

## 2023-07-30 LAB — COPPER, SERUM: Copper: 105 ug/dL (ref 80–158)

## 2023-08-03 NOTE — Progress Notes (Unsigned)
 [facility] [address], [place] [address]   CLINIC:  Medical Oncology/Hematology  PCP:  Marvine Rush, MD [address] [place] [address] [phone]   REASON FOR VISIT:  Follow-up for normocytic anemia   PRIOR THERAPY: PRBC transfusion x4 (December 2021)   CURRENT THERAPY: IV iron, oral copper  supplement  INTERVAL HISTORY:   [name] 63 y.o. female returns for routine follow-up of normocytic anemia, iron deficiency, and copper  deficiency.  She was last seen by NP Delon Hope on 04/29/2023.  She received Feraheme  x 2 on 05/03/2023 and 05/10/2023.  At today's visit, she reports feeling fairly well.  No recent hospitalizations, surgeries, or changes in baseline health status. She reports improved energy after Feraheme  in March 2025, but is starting to feel her energy wane; reports that she tires easily in the heat. No ice pica. She denies any bright of blood per rectum, melena, or epistaxis. She denies any restless legs, headaches, chest pain, dyspnea on exertion, lightheadedness, or syncope.  She takes iron-containing Flintstone vitamins x 4 daily (2 in the morning and 2 in the evening).  She does not take any copper  or vitamin B12. She has 50% energy and little to no appetite. She endorses that she is maintaining a stable weight.  ASSESSMENT & PLAN:  1.  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 Marvine.  CBC on 11/18/2020 hemoglobin 10.3 with MCV 92.  Normal white count and platelet count. - History of gastric bypass in 2013, successfully lost 200+ pounds. - Received 4 units of PRBC in December 2021 when EGD showed anastomotic ulceration at the gastrojejunostomy site. - Hospitalized November 2023 for anastomotic ulceration causing GI bleed. - Most recent EGD (01/05/2023): Normal esophagus.  Gastric bypass with normal-sized pouch and intact staple.   Gastrojejunal anastomosis characterized by edema and mild stenosis, s/p dilation.  Normal jejunum. - Most recent colonoscopy (07/14/2011): Mild diverticulosis.  Internal hemorrhoids. - Hematology work-up (03/28/2021): Moderate iron deficiency with ferritin 45 and iron saturation 11% Moderate copper  deficiency with copper  51 Normal folate.  Elevated B12. Normal reticulocytes, LDH, SPEP. Hemoccult stool x3 NEGATIVE - Current supplements: Takes iron-containing Flintstone vitamins x 4 daily (2 in the morning and 2 in the evening).   Not taking her copper  supplements Not taking her vitamin B12 supplements - Requires intermittent IV iron, most recently with Feraheme  x 2 in March 2025.  (PREMEDICATION due to history of multiple medication allergies) - Denies any bleeding per rectum or melena. 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 - Most recent labs (07/28/2023): Hgb 13.9/MCV 94.5. Vitamin B12 marginal at 296 Normal copper  105 Ferritin 146/iron saturation 29% - PLAN: No indication for IV iron at this time. - SUPPLEMENTS: DECREASE iron/multivitamins to take tablets x 2 ONCE daily (studies show decreased absorption of iron when taking more than once daily) RESTART vitamin B12 1000 mcg sublingual MWF. No need to restart copper  supplement at this time - Patient prefers 62-month follow-up, but if she demonstrates overall stability, we will switch to annual visits at next appointment.   2.  Social/family history: - She is independent of all ADLs and IADLs.  She worked in [employer].  She also worked at [employer] for few years and exposed to dyes.  No history of smoking. - Mother had breast cancer.  Maternal aunt had cancer.  Maternal  cousin has colorectal cancer.  PLAN SUMMARY: >> Labs in 6 months = CBC/D, copper , B12, MMA, ferritin, iron/TIBC >> OFFICE visit in 6 months (1 week after labs)     REVIEW OF SYSTEMS:    Review of Systems  Constitutional:  Positive for appetite change and fatigue. Negative for chills, diaphoresis, fever and unexpected weight change.  HENT:   Negative for lump/mass and nosebleeds.   Eyes:  Negative for eye problems.  Respiratory:  Negative for cough, hemoptysis and shortness of breath.   Cardiovascular:  Positive for leg swelling. Negative for chest pain and palpitations.  Gastrointestinal:  Negative for abdominal pain, blood in stool, constipation, diarrhea, nausea and vomiting.  Genitourinary:  Negative for hematuria.   Skin: Negative.   Neurological:  Negative for dizziness, headaches and light-headedness.  Hematological:  Does not bruise/bleed easily.  Psychiatric/Behavioral:  Positive for sleep disturbance.     PHYSICAL EXAM:  ECOG PERFORMANCE STATUS: 1 - Symptomatic but completely ambulatory  Vitals:   08/04/23 1430  BP: (!) 145/77  Pulse: 84  Resp: 16  Temp: 98.2 F (36.8 C)  SpO2: 99%   Filed Weights   08/04/23 1430  Weight: 175 lb 0.7 oz (79.4 kg)   Physical Exam Constitutional:      Appearance: Normal appearance. She is normal weight.   Cardiovascular:     Heart sounds: Normal heart sounds.  Pulmonary:     Breath sounds: Normal breath sounds.   Musculoskeletal:        General: Swelling (bilateral 2+ edema extending to ankles) present.   Neurological:     General: No focal deficit present.     Mental Status: Mental status is at baseline.   Psychiatric:        Behavior: Behavior normal. Behavior is cooperative.    PAST MEDICAL/SURGICAL HISTORY:  Past Medical History:  Diagnosis Date   Depression    started Lexapro  when husband passed away   Gastric ulcer 01-29-2022   Gastric ulcer 01/2020   Hypertension    Hypothyroidism    IBS (irritable bowel syndrome)    Iron deficiency anemia 04/11/2021   PONV (postoperative nausea and vomiting)    Past Surgical History:  Procedure Laterality Date   ABDOMINAL HYSTERECTOMY  08/11/2011    Procedure: HYSTERECTOMY ABDOMINAL;  Surgeon: Norleen LULLA Server, MD;  Location: [facility];  Service: Gynecology;  Laterality: N/A;   BALLOON DILATION  01/05/2023   Procedure: BALLOON DILATION;  Surgeon: Eartha Angelia Sieving, MD;  [facility];  Service: Gastroenterology;;   BIOPSY  01/11/2020   Procedure: BIOPSY;  Surgeon: Shaaron Lamar HERO, MD;  [facility]: [facility];  Service: Endoscopy;;   CESAREAN SECTION     x 2   CHOLECYSTECTOMY  2009   [facility]   ESOPHAGEAL DILATION  10/21/2022   Procedure: ESOPHAGEAL DILATION;  Surgeon: Eartha Angelia, Sieving, MD;  [facility]: [facility];  Service: Gastroenterology;;   ESOPHAGOGASTRODUODENOSCOPY  Jan 2004   RMR: normal esophagus, small hiatal hernia, normal D1, D2   ESOPHAGOGASTRODUODENOSCOPY (EGD) WITH PROPOFOL  N/A 01/11/2020   Procedure: ESOPHAGOGASTRODUODENOSCOPY (EGD) WITH PROPOFOL ;  Surgeon: Shaaron Lamar HERO, MD;  [facility];  Service: Endoscopy;  Laterality: N/A;   ESOPHAGOGASTRODUODENOSCOPY (EGD) WITH PROPOFOL  N/A 06/05/2020   Procedure: ESOPHAGOGASTRODUODENOSCOPY (EGD) WITH PROPOFOL ;  Surgeon: Golda Claudis PENNER, MD;  [facility]: [facility];  Service: Endoscopy;  Laterality: N/A;  am   ESOPHAGOGASTRODUODENOSCOPY (EGD) WITH PROPOFOL  N/A 12/31/2021   Procedure: ESOPHAGOGASTRODUODENOSCOPY (EGD) WITH PROPOFOL ;  Surgeon: Shaaron Lamar  M, MD;  [facility]: [facility];  Service: Endoscopy;  Laterality: N/A;   ESOPHAGOGASTRODUODENOSCOPY (EGD) WITH PROPOFOL  N/A 09/08/2022   Procedure: ESOPHAGOGASTRODUODENOSCOPY (EGD) WITH PROPOFOL ;  Surgeon: Eartha Angelia Sieving, MD;  [facility];  Service: Gastroenterology;  Laterality: N/A;  1:00pm;asa 3   ESOPHAGOGASTRODUODENOSCOPY (EGD) WITH PROPOFOL  N/A 10/21/2022   Procedure: ESOPHAGOGASTRODUODENOSCOPY (EGD) WITH PROPOFOL ;  Surgeon: Eartha Angelia Sieving, MD;  [facility];  Service: Gastroenterology;  Laterality: N/A;  8:15AM;ASA 1-2   ESOPHAGOGASTRODUODENOSCOPY (EGD)  WITH PROPOFOL  N/A 01/05/2023   Procedure: ESOPHAGOGASTRODUODENOSCOPY (EGD) WITH PROPOFOL ;  Surgeon: Eartha Angelia Sieving, MD;  [facility];  Service: Gastroenterology;  Laterality: N/A;  8:45AM;ASA 2   FOOT SURGERY     x 5 right   GASTRIC BYPASS     gastric stent  2020   HAND SURGERY     x2 right   HEMATOMA EVACUATION  08/14/2011   Procedure: EVACUATION HEMATOMA;  Surgeon: Norleen LULLA Server, MD;  Location: [facility];  Service: Gynecology;  Laterality: N/A;   SALPINGOOPHORECTOMY  08/11/2011   Procedure: SALPINGO OOPHERECTOMY;  Surgeon: Norleen LULLA Server, MD;  Location: [facility];  Service: Gynecology;  Laterality: Bilateral;   SCAR REVISION  08/11/2011   Procedure: SCAR REVISION;  Surgeon: Norleen LULLA Server, MD;  Location: [facility];  Service: Gynecology;  Laterality: N/A;  Excision of Wide Cicatrix   SCLEROTHERAPY N/A 12/31/2021   Procedure: SCLEROTHERAPY;  Surgeon: Shaaron Lamar HERO, MD;  [facility]: [facility];  Service: Endoscopy;  Laterality: N/A;    SOCIAL HISTORY:  Social History   Socioeconomic History   Marital status: Widowed    Spouse name: Not on file   Number of children: Not on file   Years of education: Not on file   Highest education level: Not on file  Occupational History   Not on file  Tobacco Use   Smoking status: Never    Passive exposure: Never   Smokeless tobacco: Never  Vaping Use   Vaping status: Never Used  Substance and Sexual Activity   Alcohol use: No   Drug use: No   Sexual activity: Never  Other Topics Concern   Not on file  Social History Narrative   Husband passed away 8 years ago from [employer].    Social Drivers of Corporate investment banker Strain: Not on file  Food Insecurity: No Food Insecurity (12/30/2021)   Hunger Vital Sign    Worried About Running Out of Food in the Last Year: Never true    Ran Out of Food in the Last Year: Never true  Transportation Needs: No Transportation Needs (12/30/2021)   PRAPARE - Therapist, art (Medical): No    Lack of Transportation (Non-Medical): No  Physical Activity: Not on file  Stress: Not on file  Social Connections: Not on file  Intimate Partner Violence: Not At Risk (12/30/2021)   Humiliation, Afraid, Rape, and Kick questionnaire    Fear of Current or Ex-Partner: No    Emotionally Abused: No    Physically Abused: No    Sexually Abused: No    FAMILY HISTORY:  Family History  Problem Relation Age of Onset   Colon cancer Neg Hx     CURRENT MEDICATIONS:  Outpatient Encounter Medications as of 08/04/2023  Medication Sig   clobetasol cream (TEMOVATE) 0.05 % Apply 1 Application topically 2 (two) times daily.   triamcinolone cream (KENALOG) 0.1 % Apply 1 Application topically as needed.  ALPRAZolam  (XANAX ) 0.5 MG tablet Take 0.5 mg by mouth See admin instructions. Take 1 tablet (0.5 mg) by mouth scheduled at bedtime for sleep, may take an additional dose (0.5 mg) during the day, if needed for anxiety.   barrier cream (NON-SPECIFIED) CREA Apply 1 Application topically 2 (two) times daily as needed (discomfort/skin breakdown). Fannie barrier cream   bismuth subsalicylate (PEPTO BISMOL) 262 MG chewable tablet Chew 262-524 mg by mouth every 6 (six) hours as needed for diarrhea or loose stools or indigestion.   diphenhydrAMINE  (BENADRYL ) 25 mg capsule Take 50 mg by mouth in the morning and at bedtime.   levothyroxine  (SYNTHROID ) 100 MCG tablet Take 100 mcg by mouth daily before breakfast.   omeprazole  (PRILOSEC) 40 MG capsule Take 1 capsule (40 mg total) by mouth 2 (two) times daily.   Pediatric Multivitamins-Iron (FLINTSTONES COMPLETE) 18 MG CHEW Chew 2 tablets by mouth daily. With iron   promethazine  (PHENERGAN ) 25 MG tablet Take 25 mg by mouth 3 (three) times daily as needed for vomiting or nausea.   traMADol  (ULTRAM ) 50 MG tablet Take 50 mg by mouth every 4 (four) hours as needed for pain.   vitamin C (ASCORBIC ACID) 500 MG tablet Take 500 mg by mouth  daily in the afternoon.   No facility-administered encounter medications on file as of 08/04/2023.    ALLERGIES:  Allergies  Allergen Reactions   Bee Pollen Anaphylaxis   Bee Venom Anaphylaxis and Itching    Swelling and itching   Hydrocodone -Acetaminophen  Itching   Chocolate Other (See Comments)    Reaction: causes facial blistering,inside mouth   Nsaids Nausea And Vomiting   Synthroid  [Levothyroxine  Sodium] Itching and Swelling    Patient has a reaction to brand name synthroid  only   Latex Swelling and Rash   Neosporin [Neomycin-Bacitracin Zn-Polymyx] Rash    LABORATORY DATA:  I have reviewed the labs as listed.  CBC    Component Value Date/Time   WBC 5.5 07/28/2023 1523   RBC 4.72 07/28/2023 1523   HGB 13.9 07/28/2023 1523   HCT 44.6 07/28/2023 1523   PLT 215 07/28/2023 1523   MCV 94.5 07/28/2023 1523   MCH 29.4 07/28/2023 1523   MCHC 31.2 07/28/2023 1523   RDW 12.4 07/28/2023 1523   LYMPHSABS 1.5 07/28/2023 1523   MONOABS 0.4 07/28/2023 1523   EOSABS 0.1 07/28/2023 1523   BASOSABS 0.0 07/28/2023 1523      Latest Ref Rng & Units 01/08/2022   10:02 AM 01/02/2022    3:28 AM 01/01/2022    3:36 AM  CMP  Glucose 70 - 99 mg/dL  88  87   BUN 8 - 23 mg/dL  <5  5   Creatinine 9.55 - 1.00 mg/dL  9.39  9.43   Sodium 864 - 145 mmol/L  142  141   Potassium 3.5 - 5.1 mmol/L  3.5  3.1   Chloride 98 - 111 mmol/L  110  113   CO2 22 - 32 mmol/L  27  25   Calcium  8.9 - 10.3 mg/dL  8.6  8.0   Total Protein 6.0 - 8.5 g/dL 5.6     Total Bilirubin 0.0 - 1.2 mg/dL 0.2     Alkaline Phos 44 - 121 IU/L 221     AST 0 - 40 IU/L 14     ALT 0 - 32 IU/L 13       DIAGNOSTIC IMAGING:  I have independently reviewed the relevant imaging and discussed with the patient.  WRAP UP:  All questions were answered. The patient knows to call the clinic with any problems, questions or concerns.  Medical decision making: Moderate  Time spent on visit: I spent 20 minutes counseling the  patient face to face. The total time spent in the appointment was 30 minutes and more than 50% was on counseling.  Pleasant CHRISTELLA Barefoot, PA-C  08/04/23 3:10 PM

## 2023-08-04 ENCOUNTER — Inpatient Hospital Stay (HOSPITAL_BASED_OUTPATIENT_CLINIC_OR_DEPARTMENT_OTHER): Admitting: Physician Assistant

## 2023-08-04 VITALS — BP 145/77 | HR 84 | Temp 98.2°F | Resp 16 | Ht 70.0 in | Wt 175.0 lb

## 2023-08-04 DIAGNOSIS — E610 Copper deficiency: Secondary | ICD-10-CM | POA: Diagnosis not present

## 2023-08-04 DIAGNOSIS — K912 Postsurgical malabsorption, not elsewhere classified: Secondary | ICD-10-CM | POA: Diagnosis not present

## 2023-08-04 DIAGNOSIS — E538 Deficiency of other specified B group vitamins: Secondary | ICD-10-CM

## 2023-08-04 DIAGNOSIS — D509 Iron deficiency anemia, unspecified: Secondary | ICD-10-CM

## 2023-08-04 DIAGNOSIS — Z903 Acquired absence of stomach [part of]: Secondary | ICD-10-CM

## 2023-08-04 DIAGNOSIS — Z91014 Allergy to mammalian meats: Secondary | ICD-10-CM | POA: Insufficient documentation

## 2023-08-04 MED ORDER — B-12 1000 MCG SL SUBL: 1000.0000 ug | SUBLINGUAL_TABLET | SUBLINGUAL | 1 refills | Status: AC

## 2023-08-04 NOTE — Patient Instructions (Signed)
 [facility] at [facility] Discharge Instructions  You were seen today by Pleasant Barefoot PA-C for your history of anemia and your vitamin and mineral deficiencies.  - IRON DEFICIENCY: Your iron levels look great!  You do not need any additional IV iron at this time.  Continue taking your iron-containing multivitamin, but decrease this to take 2 tablets at the same time each morning (instead of taking 2 tablets in the morning and 2 tablets at night).  - COPPER  DEFICIENCY: Your copper  levels are currently normal.  You do not need to restart any copper  supplement.  - VITAMIN B-12 DEFICIENCY: Your vitamin B12 levels are low.  Please restart vitamin B12 1000 mcg sublingual (under the tongue) on Mondays, Wednesdays, and Fridays.  FOLLOW-UP APPOINTMENT: Labs and office visit in 6 months   Thank you for choosing [facility] at [facility] to provide your oncology and hematology care.  To afford each patient quality time with our provider, please arrive at least 15 minutes before your scheduled appointment time.   If you have a lab appointment with the Cancer Center please come in thru the Main Entrance and check in at the main information desk.  You need to re-schedule your appointment should you arrive 10 or more minutes late.  We strive to give you quality time with our providers, and arriving late affects you and other patients whose appointments are after yours.  Also, if you no show three or more times for appointments you may be dismissed from the clinic at the providers discretion.     Again, thank you for choosing [facility].  Our hope is that these requests will decrease the amount of time that you wait before being seen by our physicians.       _____________________________________________________________  Should you have questions after your visit to [facility], please contact our office at [phone] and follow  the prompts.  Our office hours are 8:00 a.m. and 4:30 p.m. Monday - Friday.  Please note that voicemails left after 4:00 p.m. may not be returned until the following business day.  We are closed weekends and major holidays.  You do have access to a nurse 24-7, just call the main number to the clinic [phone] and do not press any options, hold on the line and a nurse will answer the phone.    For prescription refill requests, have your pharmacy contact our office and allow 72 hours.    Due to Covid, you will need to wear a mask upon entering the hospital. If you do not have a mask, a mask will be given to you at the Main Entrance upon arrival. For doctor visits, patients may have 1 support person age 96 or older with them. For treatment visits, patients can not have anyone with them due to social distancing guidelines and our immunocompromised population.

## 2023-09-08 ENCOUNTER — Encounter (HOSPITAL_COMMUNITY): Payer: Self-pay | Admitting: Hematology

## 2023-10-06 ENCOUNTER — Encounter (INDEPENDENT_AMBULATORY_CARE_PROVIDER_SITE_OTHER): Payer: Self-pay | Admitting: Gastroenterology

## 2023-10-12 ENCOUNTER — Encounter (HOSPITAL_COMMUNITY): Payer: Self-pay

## 2023-10-12 ENCOUNTER — Other Ambulatory Visit: Payer: Self-pay

## 2023-10-12 ENCOUNTER — Emergency Department (HOSPITAL_COMMUNITY)

## 2023-10-12 ENCOUNTER — Emergency Department (HOSPITAL_COMMUNITY)
Admission: EM | Admit: 2023-10-12 | Discharge: 2023-10-13 | Disposition: A | Discharge status: Home / Self Care | Attending: Emergency Medicine | Admitting: Emergency Medicine

## 2023-10-12 DIAGNOSIS — K561 Intussusception: Secondary | ICD-10-CM | POA: Diagnosis not present

## 2023-10-12 DIAGNOSIS — R1110 Vomiting, unspecified: Secondary | ICD-10-CM | POA: Diagnosis present

## 2023-10-12 LAB — CBC
HCT: 43.4 % (ref 36.0–46.0)
Hemoglobin: 14.1 g/dL (ref 12.0–15.0)
MCH: 30.1 pg (ref 26.0–34.0)
MCHC: 32.5 g/dL (ref 30.0–36.0)
MCV: 92.5 fL (ref 80.0–100.0)
Platelets: 201 K/uL (ref 150–400)
RBC: 4.69 MIL/uL (ref 3.87–5.11)
RDW: 12.1 % (ref 11.5–15.5)
WBC: 5.6 K/uL (ref 4.0–10.5)
nRBC: 0 % (ref 0.0–0.2)

## 2023-10-12 LAB — COMPREHENSIVE METABOLIC PANEL WITH GFR
ALT: 180 U/L — ABNORMAL HIGH (ref 0–44)
AST: 299 U/L — ABNORMAL HIGH (ref 15–41)
Albumin: 3 g/dL — ABNORMAL LOW (ref 3.5–5.0)
Alkaline Phosphatase: 297 U/L — ABNORMAL HIGH (ref 38–126)
Anion gap: 9 (ref 5–15)
BUN: 7 mg/dL — ABNORMAL LOW (ref 8–23)
CO2: 22 mmol/L (ref 22–32)
Calcium: 8.9 mg/dL (ref 8.9–10.3)
Chloride: 103 mmol/L (ref 98–111)
Creatinine, Ser: 0.66 mg/dL (ref 0.44–1.00)
GFR, Estimated: >60 mL/min (ref >60)
Glucose, Bld: 145 mg/dL — ABNORMAL HIGH (ref 70–99)
Potassium: 3.5 mmol/L (ref 3.5–5.1)
Sodium: 134 mmol/L — ABNORMAL LOW (ref 135–145)
Total Bilirubin: 0.6 mg/dL (ref 0.0–1.2)
Total Protein: 6.1 g/dL — ABNORMAL LOW (ref 6.5–8.1)

## 2023-10-12 LAB — LIPASE, BLOOD: Lipase: 22 U/L (ref 11–51)

## 2023-10-12 MED ORDER — IOHEXOL 300 MG/ML  SOLN
100.0000 mL | Freq: Once | INTRAMUSCULAR | Status: AC | PRN
  Administered 2023-10-12: 100 mL via INTRAVENOUS

## 2023-10-12 MED ORDER — ONDANSETRON HCL 4 MG/2ML IJ SOLN
4.0000 mg | Freq: Once | INTRAMUSCULAR | Status: AC
  Administered 2023-10-12: 4 mg via INTRAVENOUS
  Filled 2023-10-12: qty 2

## 2023-10-12 MED ORDER — LACTATED RINGERS IV BOLUS
1000.0000 mL | Freq: Once | INTRAVENOUS | Status: AC
  Administered 2023-10-12: 1000 mL via INTRAVENOUS

## 2023-10-12 NOTE — ED Provider Notes (Signed)
 [place] EMERGENCY DEPARTMENT AT [facility] Provider Note   CSN: [id number] Arrival date & time: 10/12/23  1929     History No chief complaint on file.   HPI [name] is a 63 y.o. female presenting for chief complaint of vomitting. 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 Complicated by ulcers and obstructions/  Patient's recorded medical, surgical, social, medication list and allergies were reviewed in the Snapshot window as part of the initial history.   Review of Systems   Review of Systems  Constitutional:  Negative for chills and fever.  HENT:  Negative for ear pain and sore throat.   Eyes:  Negative for pain and visual disturbance.  Respiratory:  Negative for cough and shortness of breath.   Cardiovascular:  Negative for chest pain and palpitations.  Gastrointestinal:  Positive for abdominal pain, nausea and vomiting.  Genitourinary:  Negative for dysuria and hematuria.  Musculoskeletal:  Negative for arthralgias and back pain.  Skin:  Negative for color change and rash.  Neurological:  Negative for seizures and syncope.  All other systems reviewed and are negative.   Physical Exam Updated Vital Signs BP (!) 159/85 (BP Location: Right Arm)   Pulse 83   Temp 98.2 F (36.8 C) (Oral)   Resp 18   LMP 08/14/2011   SpO2 100%  Physical Exam Vitals and nursing note reviewed.  Constitutional:      General: She is not in acute distress.    Appearance: She is well-developed.  HENT:     Head: Normocephalic and atraumatic.  Eyes:     Conjunctiva/sclera: Conjunctivae normal.  Cardiovascular:     Rate and Rhythm: Normal rate and regular rhythm.     Heart sounds: No murmur heard. Pulmonary:     Effort: Pulmonary effort is normal. No respiratory distress.     Breath sounds: Normal breath sounds.  Abdominal:     General: There is no distension.     Palpations: Abdomen is soft.     Tenderness: There is abdominal tenderness. There is no  right CVA tenderness or left CVA tenderness.  Musculoskeletal:        General: No swelling or tenderness. Normal range of motion.     Cervical back: Neck supple.  Skin:    General: Skin is warm and dry.  Neurological:     General: No focal deficit present.     Mental Status: She is alert and oriented to person, place, and time. Mental status is at baseline.     Cranial Nerves: No cranial nerve deficit.      ED Course/ Medical Decision Making/ A&P    Procedures Procedures   Medications Ordered in ED Medications - No data to display Medical Decision Making:   [name] is a 63 y.o. female who presented to the ED today with abdominal pain, detailed above.    Patient placed on continuous vitals and telemetry monitoring while in ED which was reviewed periodically.  Complete initial physical exam performed, notably the patient  was HDS in NAD.     Reviewed and confirmed nursing documentation for past medical history, family history, social history.    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 Other diagnoses were considered including (but not limited to) gastroenteritis, colitis, small bowel obstruction, appendicitis, cholecystitis, pancreatitis, nephrolithiasis, UTI, pyleonephritis. These are considered less likely due to history of present illness and physical exam findings.   {  crccopa:27899}   Initial Plan:  CBC/CMP to evaluate for underlying infectious/metabolic etiology for patient's abdominal pain  Lipase to evaluate for pancreatitis  EKG to evaluate for cardiac source of pain  ***CTAB/Pelvis with contrast to evaluate for structural/surgical etiology of patients' severe abdominal pain.  ***CT Ab/pelvis without contrast due to favored nephrolithiasis over GI etiology for patient's abdominal pain  ***Testicular ***Pelvic US  to evaluate for torsion  Urinalysis and repeat physical assessment to  evaluate for UTI/Pyelonpehritis  Empiric management of symptoms with escalating pain control and antiemetics as needed.   Initial Study Results:   Laboratory  All laboratory results reviewed without evidence of clinically relevant pathology.   ***Exceptions include: ***    ***EKG ***EKG was reviewed independently. Rate, rhythm, axis, intervals all examined and without medically relevant abnormality. ST segments without concerns for elevations.    Radiology All images reviewed independently. ***Agree with radiology report at this time.   No results found.   Consults: Case discussed with ***.   Final Reassessment and Plan:   ***       Clinical Impression: No diagnosis found.   Data Unavailable   Final Clinical Impression(s) / ED Diagnoses Final diagnoses:  None    Rx / DC Orders ED Discharge Orders     None

## 2023-10-12 NOTE — ED Triage Notes (Signed)
 Pt c/o emesis x 2 hours ago. Endorses some abdominal pain as well.

## 2023-10-13 LAB — URINALYSIS, ROUTINE W REFLEX MICROSCOPIC
Bilirubin Urine: NEGATIVE
Glucose, UA: NEGATIVE mg/dL
Hgb urine dipstick: NEGATIVE
Ketones, ur: 5 mg/dL — AB
Leukocytes,Ua: NEGATIVE
Nitrite: NEGATIVE
Protein, ur: NEGATIVE mg/dL
Specific Gravity, Urine: 1.014 (ref 1.005–1.030)
pH: 6 (ref 5.0–8.0)

## 2023-10-13 MED ORDER — ONDANSETRON HCL 4 MG PO TABS: 4.0000 mg | ORAL_TABLET | Freq: Four times a day (QID) | ORAL | 0 refills | Status: AC

## 2023-10-13 NOTE — ED Notes (Signed)
 Pt given water for PO challenge.

## 2023-11-24 ENCOUNTER — Encounter (INDEPENDENT_AMBULATORY_CARE_PROVIDER_SITE_OTHER): Payer: Self-pay | Admitting: Gastroenterology

## 2023-12-07 ENCOUNTER — Other Ambulatory Visit (INDEPENDENT_AMBULATORY_CARE_PROVIDER_SITE_OTHER): Payer: Self-pay

## 2023-12-07 DIAGNOSIS — K284 Chronic or unspecified gastrojejunal ulcer with hemorrhage: Secondary | ICD-10-CM

## 2023-12-07 DIAGNOSIS — K9189 Other postprocedural complications and disorders of digestive system: Secondary | ICD-10-CM

## 2023-12-07 DIAGNOSIS — K289 Gastrojejunal ulcer, unspecified as acute or chronic, without hemorrhage or perforation: Secondary | ICD-10-CM

## 2023-12-07 DIAGNOSIS — K922 Gastrointestinal hemorrhage, unspecified: Secondary | ICD-10-CM

## 2023-12-07 MED ORDER — OMEPRAZOLE 40 MG PO CPDR
40.0000 mg | DELAYED_RELEASE_CAPSULE | Freq: Two times a day (BID) | ORAL | 3 refills | Status: AC
Start: 2023-12-07 — End: ?

## 2024-01-25 ENCOUNTER — Inpatient Hospital Stay: Attending: Oncology

## 2024-01-25 DIAGNOSIS — K648 Other hemorrhoids: Secondary | ICD-10-CM | POA: Diagnosis not present

## 2024-01-25 DIAGNOSIS — Z9884 Bariatric surgery status: Secondary | ICD-10-CM | POA: Insufficient documentation

## 2024-01-25 DIAGNOSIS — Z91040 Latex allergy status: Secondary | ICD-10-CM | POA: Insufficient documentation

## 2024-01-25 DIAGNOSIS — Z8711 Personal history of peptic ulcer disease: Secondary | ICD-10-CM | POA: Diagnosis not present

## 2024-01-25 DIAGNOSIS — K912 Postsurgical malabsorption, not elsewhere classified: Secondary | ICD-10-CM

## 2024-01-25 DIAGNOSIS — Z885 Allergy status to narcotic agent status: Secondary | ICD-10-CM | POA: Insufficient documentation

## 2024-01-25 DIAGNOSIS — Z881 Allergy status to other antibiotic agents status: Secondary | ICD-10-CM | POA: Insufficient documentation

## 2024-01-25 DIAGNOSIS — D509 Iron deficiency anemia, unspecified: Secondary | ICD-10-CM | POA: Insufficient documentation

## 2024-01-25 DIAGNOSIS — K909 Intestinal malabsorption, unspecified: Secondary | ICD-10-CM | POA: Insufficient documentation

## 2024-01-25 DIAGNOSIS — E538 Deficiency of other specified B group vitamins: Secondary | ICD-10-CM | POA: Diagnosis not present

## 2024-01-25 DIAGNOSIS — Z886 Allergy status to analgesic agent status: Secondary | ICD-10-CM | POA: Insufficient documentation

## 2024-01-25 DIAGNOSIS — Z9049 Acquired absence of other specified parts of digestive tract: Secondary | ICD-10-CM | POA: Insufficient documentation

## 2024-01-25 DIAGNOSIS — Z79890 Hormone replacement therapy (postmenopausal): Secondary | ICD-10-CM | POA: Diagnosis not present

## 2024-01-25 DIAGNOSIS — Z90721 Acquired absence of ovaries, unilateral: Secondary | ICD-10-CM | POA: Insufficient documentation

## 2024-01-25 DIAGNOSIS — Z91030 Bee allergy status: Secondary | ICD-10-CM | POA: Insufficient documentation

## 2024-01-25 DIAGNOSIS — Z809 Family history of malignant neoplasm, unspecified: Secondary | ICD-10-CM | POA: Insufficient documentation

## 2024-01-25 DIAGNOSIS — E610 Copper deficiency: Secondary | ICD-10-CM | POA: Insufficient documentation

## 2024-01-25 DIAGNOSIS — Z634 Disappearance and death of family member: Secondary | ICD-10-CM | POA: Insufficient documentation

## 2024-01-25 DIAGNOSIS — Z90710 Acquired absence of both cervix and uterus: Secondary | ICD-10-CM | POA: Diagnosis not present

## 2024-01-25 DIAGNOSIS — Z79899 Other long term (current) drug therapy: Secondary | ICD-10-CM | POA: Insufficient documentation

## 2024-01-25 DIAGNOSIS — I10 Essential (primary) hypertension: Secondary | ICD-10-CM | POA: Diagnosis not present

## 2024-01-25 DIAGNOSIS — Z803 Family history of malignant neoplasm of breast: Secondary | ICD-10-CM | POA: Diagnosis not present

## 2024-01-25 DIAGNOSIS — Z800 Family history of malignant neoplasm of digestive organs: Secondary | ICD-10-CM | POA: Insufficient documentation

## 2024-01-25 LAB — CBC WITH DIFFERENTIAL/PLATELET
Abs Immature Granulocytes: 0.01 K/uL (ref 0.00–0.07)
Basophils Absolute: 0 K/uL (ref 0.0–0.1)
Basophils Relative: 1 %
Eosinophils Absolute: 0.1 K/uL (ref 0.0–0.5)
Eosinophils Relative: 2 %
HCT: 43.8 % (ref 36.0–46.0)
Hemoglobin: 13.4 g/dL (ref 12.0–15.0)
Immature Granulocytes: 0 %
Lymphocytes Relative: 28 %
Lymphs Abs: 1.5 K/uL (ref 0.7–4.0)
MCH: 29.3 pg (ref 26.0–34.0)
MCHC: 30.6 g/dL (ref 30.0–36.0)
MCV: 95.6 fL (ref 80.0–100.0)
Monocytes Absolute: 0.4 K/uL (ref 0.1–1.0)
Monocytes Relative: 7 %
Neutro Abs: 3.3 K/uL (ref 1.7–7.7)
Neutrophils Relative %: 62 %
Platelets: 228 K/uL (ref 150–400)
RBC: 4.58 MIL/uL (ref 3.87–5.11)
RDW: 12.7 % (ref 11.5–15.5)
WBC: 5.2 K/uL (ref 4.0–10.5)
nRBC: 0 % (ref 0.0–0.2)

## 2024-01-25 LAB — IRON AND TIBC
Iron: 40 ug/dL (ref 28–170)
Saturation Ratios: 16 % (ref 10.4–31.8)
TIBC: 249 ug/dL — ABNORMAL LOW (ref 250–450)
UIBC: 209 ug/dL

## 2024-01-25 LAB — VITAMIN B12: Vitamin B-12: 601 pg/mL (ref 180–914)

## 2024-01-25 LAB — FERRITIN: Ferritin: 226 ng/mL (ref 11–307)

## 2024-01-26 LAB — COPPER, SERUM: Copper: 86 ug/dL (ref 80–158)

## 2024-01-30 LAB — METHYLMALONIC ACID, SERUM: Methylmalonic Acid, Quantitative: 111 nmol/L (ref 0–378)

## 2024-01-31 NOTE — Progress Notes (Unsigned)
 "  [facility] [address], [place] [address]   CLINIC:  Medical Oncology/Hematology  PCP:  Marvine Rush, MD [address] [place] [address] [phone]   REASON FOR VISIT:  Follow-up for normocytic anemia   PRIOR THERAPY: PRBC transfusion x4 (December 2021)   CURRENT THERAPY: IV iron, oral copper  supplement  INTERVAL HISTORY:   [name] 63 y.o. female returns for routine follow-up of normocytic anemia, iron deficiency, and copper  deficiency.  She was last seen by Pleasant Barefoot PA-C on 08/04/2023. Her most recent IV iron was with Feraheme  x 2 on 05/03/2023 and 05/10/2023.  At today's visit, she reports feeling fairly well.   No recent hospitalizations, surgeries, or major changes in baseline health status.  She has 75% energy and 25% appetite.  She endorses that she is maintaining a stable weight.  She reports fairly good energy, which had previously improved with IV iron. No ice pica. She denies any bright of blood per rectum, melena, or epistaxis. She denies any restless legs, headaches, chest pain, dyspnea on exertion, lightheadedness, or syncope. She takes iron-containing Flintstone vitamins x 2 once daily.    ASSESSMENT & PLAN:  1.  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 Marvine.  CBC on 11/18/2020 hemoglobin 10.3 with MCV 92.  Normal white count and platelet count. - History of gastric bypass in 2013, successfully lost 200+ pounds. - Received 4 units of PRBC in December 2021 when EGD showed anastomotic ulceration at the gastrojejunostomy site. - Hospitalized November 2023 for anastomotic ulceration causing GI bleed. - Most recent EGD (01/05/2023): Normal esophagus.  Gastric bypass with normal-sized pouch and intact staple.  Gastrojejunal anastomosis characterized by edema and mild  stenosis, s/p dilation.  Normal jejunum. - Most recent colonoscopy (07/14/2011): Mild diverticulosis.  Internal hemorrhoids. - Hematology work-up (03/28/2021): Moderate iron deficiency with ferritin 45 and iron saturation 11% Moderate copper  deficiency with copper  51 Normal folate.  Elevated B12. Normal reticulocytes, LDH, SPEP. Hemoccult stool x3 NEGATIVE - Current supplements: Takes iron-containing Flintstone vitamins x 2 once daily No longer on copper  or B12 supplements.   - Requires intermittent IV iron, most recently with Feraheme  x 2 in March 2025.  (PREMEDICATION due to history of multiple medication allergies) - Denies any bleeding per rectum or melena. - Suspect iron deficiency and copper  deficiency related anemia in the setting of malabsorption from gastric bypass, and prior GI bleeding from ulcer. - Most recent labs (01/25/2024): Hgb 13.4/MCV 95.6. Vitamin B12 normalized at 601, normal MMA. Normal copper  86 Ferritin 226/iron saturation 16% - PLAN: No indication for IV iron at this time. - SUPPLEMENTS: Continue iron/multivitamin ONCE daily (studies show decreased absorption of iron when taking more than once daily) No need to restart copper  or B12 supplements at this time - We will switch to annual visit at this time and consider discharge from clinic next year if overall stable.   2.  Social/family history: - She is independent of all ADLs and IADLs.  She worked in [employer].  She also worked at [employer] for few years and exposed to dyes.  No history of smoking. - Mother had breast cancer.  Maternal aunt had cancer.  Maternal cousin has colorectal cancer.  PLAN SUMMARY: >> Labs in 1 year = CBC/D, copper , B12, MMA, ferritin, iron/TIBC >> OFFICE visit in 1 year (  1 week after labs)     REVIEW OF SYSTEMS:   Review of Systems  Constitutional:  Negative for appetite change, chills, diaphoresis, fatigue, fever and unexpected weight change.  HENT:   Negative for lump/mass  and nosebleeds.   Eyes:  Negative for eye problems.  Respiratory:  Negative for cough, hemoptysis and shortness of breath.   Cardiovascular:  Negative for chest pain, leg swelling and palpitations.  Gastrointestinal:  Negative for abdominal pain, blood in stool, constipation, diarrhea, nausea and vomiting.  Genitourinary:  Negative for hematuria.   Skin: Negative.   Neurological:  Negative for dizziness, headaches and light-headedness.  Hematological:  Does not bruise/bleed easily.  Psychiatric/Behavioral:  Positive for sleep disturbance.     PHYSICAL EXAM:  ECOG PERFORMANCE STATUS: 1 - Symptomatic but completely ambulatory  Vitals:   02/01/24 1302 02/01/24 1307  BP: (!) 133/91 116/72  Pulse: (!) 122   Resp: 16   Temp: 98.1 F (36.7 C)   SpO2: 100%     Filed Weights   02/01/24 1302  Weight: 175 lb 7.8 oz (79.6 kg)    Physical Exam Constitutional:      Appearance: Normal appearance. She is normal weight.  Cardiovascular:     Heart sounds: Normal heart sounds.  Pulmonary:     Breath sounds: Normal breath sounds.  Neurological:     General: No focal deficit present.     Mental Status: Mental status is at baseline.  Psychiatric:        Behavior: Behavior normal. Behavior is cooperative.    PAST MEDICAL/SURGICAL HISTORY:  Past Medical History:  Diagnosis Date   Depression    started Lexapro  when husband passed away   Gastric ulcer 2022/01/26   Gastric ulcer 01/2020   Hypertension    Hypothyroidism    IBS (irritable bowel syndrome)    Iron deficiency anemia 04/11/2021   PONV (postoperative nausea and vomiting)    Past Surgical History:  Procedure Laterality Date   ABDOMINAL HYSTERECTOMY  08/11/2011   Procedure: HYSTERECTOMY ABDOMINAL;  Surgeon: Norleen LULLA Server, MD;  Location: [facility];  Service: Gynecology;  Laterality: N/A;   BALLOON DILATION  01/05/2023   Procedure: BALLOON DILATION;  Surgeon: Eartha Angelia Sieving, MD;  [facility];  Service:  Gastroenterology;;   BIOPSY  01/11/2020   Procedure: BIOPSY;  Surgeon: Shaaron Lamar HERO, MD;  [facility];  Service: Endoscopy;;   CESAREAN SECTION     x 2   CHOLECYSTECTOMY  2009   [facility]   ESOPHAGEAL DILATION  10/21/2022   Procedure: ESOPHAGEAL DILATION;  Surgeon: Eartha Angelia, Sieving, MD;  [facility];  Service: Gastroenterology;;   ESOPHAGOGASTRODUODENOSCOPY  Jan 2004   RMR: normal esophagus, small hiatal hernia, normal D1, D2   ESOPHAGOGASTRODUODENOSCOPY (EGD) WITH PROPOFOL  N/A 01/11/2020   Procedure: ESOPHAGOGASTRODUODENOSCOPY (EGD) WITH PROPOFOL ;  Surgeon: Shaaron Lamar HERO, MD;  [facility];  Service: Endoscopy;  Laterality: N/A;   ESOPHAGOGASTRODUODENOSCOPY (EGD) WITH PROPOFOL  N/A 06/05/2020   Procedure: ESOPHAGOGASTRODUODENOSCOPY (EGD) WITH PROPOFOL ;  Surgeon: Golda Claudis PENNER, MD;  [facility];  Service: Endoscopy;  Laterality: N/A;  am   ESOPHAGOGASTRODUODENOSCOPY (EGD) WITH PROPOFOL  N/A 12/31/2021   Procedure: ESOPHAGOGASTRODUODENOSCOPY (EGD) WITH PROPOFOL ;  Surgeon: Shaaron Lamar HERO, MD;  [facility];  Service: Endoscopy;  Laterality: N/A;   ESOPHAGOGASTRODUODENOSCOPY (EGD) WITH PROPOFOL  N/A 09/08/2022   Procedure: ESOPHAGOGASTRODUODENOSCOPY (EGD) WITH PROPOFOL ;  Surgeon: Eartha Angelia Sieving, MD;  [facility];  Service: Gastroenterology;  Laterality: N/A;  1:00pm;asa 3   ESOPHAGOGASTRODUODENOSCOPY (EGD) WITH PROPOFOL  N/A 10/21/2022   Procedure: ESOPHAGOGASTRODUODENOSCOPY (EGD) WITH PROPOFOL ;  Surgeon: Eartha Angelia Sieving, MD;  [facility];  Service: Gastroenterology;  Laterality: N/A;  8:15AM;ASA 1-2   ESOPHAGOGASTRODUODENOSCOPY (EGD) WITH PROPOFOL  N/A 01/05/2023   Procedure: ESOPHAGOGASTRODUODENOSCOPY (EGD) WITH PROPOFOL ;  Surgeon: Eartha Angelia Sieving, MD;  [facility];  Service: Gastroenterology;  Laterality: N/A;  8:45AM;ASA 2   FOOT SURGERY     x 5 right   GASTRIC  BYPASS     gastric stent  2020   HAND SURGERY     x2 right   HEMATOMA EVACUATION  08/14/2011   Procedure: EVACUATION HEMATOMA;  Surgeon: Norleen LULLA Server, MD;  Location: [facility];  Service: Gynecology;  Laterality: N/A;   SALPINGOOPHORECTOMY  08/11/2011   Procedure: SALPINGO OOPHERECTOMY;  Surgeon: Norleen LULLA Server, MD;  Location: [facility];  Service: Gynecology;  Laterality: Bilateral;   SCAR REVISION  08/11/2011   Procedure: SCAR REVISION;  Surgeon: Norleen LULLA Server, MD;  Location: [facility];  Service: Gynecology;  Laterality: N/A;  Excision of Wide Cicatrix   SCLEROTHERAPY N/A 12/31/2021   Procedure: SCLEROTHERAPY;  Surgeon: Shaaron Lamar HERO, MD;  [facility];  Service: Endoscopy;  Laterality: N/A;    SOCIAL HISTORY:  Social History   Socioeconomic History   Marital status: Widowed    Spouse name: Not on file   Number of children: Not on file   Years of education: Not on file   Highest education level: Not on file  Occupational History   Not on file  Tobacco Use   Smoking status: Never    Passive exposure: Never   Smokeless tobacco: Never  Vaping Use   Vaping status: Never Used  Substance and Sexual Activity   Alcohol use: No   Drug use: No   Sexual activity: Never  Other Topics Concern   Not on file  Social History Narrative   Husband passed away 8 years ago from [employer].    Social Drivers of Health   Tobacco Use: Low Risk (10/12/2023)   Patient History    Smoking Tobacco Use: Never    Smokeless Tobacco Use: Never    Passive Exposure: Never  Financial Resource Strain: Not on file  Food Insecurity: No Food Insecurity (12/30/2021)   Hunger Vital Sign    Worried About Running Out of Food in the Last Year: Never true    Ran Out of Food in the Last Year: Never true  Transportation Needs: No Transportation Needs (12/30/2021)   PRAPARE - Administrator, Civil Service (Medical): No    Lack of Transportation (Non-Medical): No  Physical Activity: Not on file   Stress: Not on file  Social Connections: Not on file  Intimate Partner Violence: Not At Risk (12/30/2021)   Humiliation, Afraid, Rape, and Kick questionnaire    Fear of Current or Ex-Partner: No    Emotionally Abused: No    Physically Abused: No    Sexually Abused: No  Depression (PHQ2-9): Low Risk (02/01/2024)   Depression (PHQ2-9)    PHQ-2 Score: 0  Alcohol Screen: Not on file  Housing: Low Risk (12/30/2021)   Housing    Last Housing Risk Score: 0  Utilities: Not At Risk (12/30/2021)   AHC Utilities    Threatened with loss of utilities: No  Health Literacy: Not on file    FAMILY HISTORY:  Family History  Problem Relation Age of Onset  Colon cancer Neg Hx     CURRENT MEDICATIONS:  Outpatient Encounter Medications as of 02/01/2024  Medication Sig   ALPRAZolam  (XANAX ) 0.5 MG tablet Take 0.5 mg by mouth See admin instructions. Take 1 tablet (0.5 mg) by mouth scheduled at bedtime for sleep, may take an additional dose (0.5 mg) during the day, if needed for anxiety.   barrier cream (NON-SPECIFIED) CREA Apply 1 Application topically 2 (two) times daily as needed (discomfort/skin breakdown). Fannie barrier cream   bismuth subsalicylate (PEPTO BISMOL) 262 MG chewable tablet Chew 262-524 mg by mouth every 6 (six) hours as needed for diarrhea or loose stools or indigestion.   clobetasol cream (TEMOVATE) 0.05 % Apply 1 Application topically 2 (two) times daily.   Cyanocobalamin  (B-12) 1000 MCG SUBL Place 1,000 mcg under the tongue every Monday, Wednesday, and Friday.   diphenhydrAMINE  (BENADRYL ) 25 mg capsule Take 50 mg by mouth in the morning and at bedtime.   levothyroxine  (SYNTHROID ) 100 MCG tablet Take 100 mcg by mouth daily before breakfast.   omeprazole  (PRILOSEC) 40 MG capsule Take 1 capsule (40 mg total) by mouth 2 (two) times daily.   ondansetron  (ZOFRAN ) 4 MG tablet Take 1 tablet (4 mg total) by mouth every 6 (six) hours.   Pediatric Multivitamins-Iron (FLINTSTONES COMPLETE)  18 MG CHEW Chew 2 tablets by mouth daily. With iron   promethazine  (PHENERGAN ) 25 MG tablet Take 25 mg by mouth 3 (three) times daily as needed for vomiting or nausea.   traMADol  (ULTRAM ) 50 MG tablet Take 50 mg by mouth every 4 (four) hours as needed for pain.   triamcinolone cream (KENALOG) 0.1 % Apply 1 Application topically as needed.   vitamin C (ASCORBIC ACID) 500 MG tablet Take 500 mg by mouth daily in the afternoon.   No facility-administered encounter medications on file as of 02/01/2024.    ALLERGIES:  Allergies  Allergen Reactions   Bee Pollen Anaphylaxis   Bee Venom Anaphylaxis and Itching    Swelling and itching   Hydrocodone -Acetaminophen  Itching   Chocolate Other (See Comments)    Reaction: causes facial blistering,inside mouth   Nsaids Nausea And Vomiting   Synthroid  [Levothyroxine  Sodium] Itching and Swelling    Patient has a reaction to brand name synthroid  only   Latex Swelling and Rash   Neosporin [Neomycin-Bacitracin Zn-Polymyx] Rash    LABORATORY DATA:  I have reviewed the labs as listed.  CBC    Component Value Date/Time   WBC 5.2 01/25/2024 1251   RBC 4.58 01/25/2024 1251   HGB 13.4 01/25/2024 1251   HCT 43.8 01/25/2024 1251   PLT 228 01/25/2024 1251   MCV 95.6 01/25/2024 1251   MCH 29.3 01/25/2024 1251   MCHC 30.6 01/25/2024 1251   RDW 12.7 01/25/2024 1251   LYMPHSABS 1.5 01/25/2024 1251   MONOABS 0.4 01/25/2024 1251   EOSABS 0.1 01/25/2024 1251   BASOSABS 0.0 01/25/2024 1251      Latest Ref Rng & Units 10/12/2023    8:01 PM 01/08/2022   10:02 AM 01/02/2022    3:28 AM  CMP  Glucose 70 - 99 mg/dL 854   88   BUN 8 - 23 mg/dL 7   <5   Creatinine 9.55 - 1.00 mg/dL 9.33   9.39   Sodium 864 - 145 mmol/L 134   142   Potassium 3.5 - 5.1 mmol/L 3.5   3.5   Chloride 98 - 111 mmol/L 103   110   CO2 22 - 32 mmol/L 22  27   Calcium  8.9 - 10.3 mg/dL 8.9   8.6   Total Protein 6.5 - 8.1 g/dL 6.1  5.6    Total Bilirubin 0.0 - 1.2 mg/dL 0.6  0.2     Alkaline Phos 38 - 126 U/L 297  221    AST 15 - 41 U/L 299  14    ALT 0 - 44 U/L 180  13      DIAGNOSTIC IMAGING:  I have independently reviewed the relevant imaging and discussed with the patient.   WRAP UP:  All questions were answered. The patient knows to call the clinic with any problems, questions or concerns.  Medical decision making: Moderate  Time spent on visit: I spent 20 minutes counseling the patient face to face. The total time spent in the appointment was 30 minutes and more than 50% was on counseling.  Pleasant CHRISTELLA Barefoot, PA-C  02/01/2024 1:51 PM  "

## 2024-02-01 ENCOUNTER — Inpatient Hospital Stay (HOSPITAL_BASED_OUTPATIENT_CLINIC_OR_DEPARTMENT_OTHER): Admitting: Physician Assistant

## 2024-02-01 VITALS — BP 116/72 | HR 122 | Temp 98.1°F | Resp 16 | Wt 175.5 lb

## 2024-02-01 DIAGNOSIS — K912 Postsurgical malabsorption, not elsewhere classified: Secondary | ICD-10-CM | POA: Diagnosis not present

## 2024-02-01 DIAGNOSIS — E610 Copper deficiency: Secondary | ICD-10-CM

## 2024-02-01 DIAGNOSIS — E538 Deficiency of other specified B group vitamins: Secondary | ICD-10-CM | POA: Diagnosis not present

## 2024-02-01 DIAGNOSIS — D509 Iron deficiency anemia, unspecified: Secondary | ICD-10-CM

## 2024-02-01 DIAGNOSIS — Z903 Acquired absence of stomach [part of]: Secondary | ICD-10-CM | POA: Diagnosis not present

## 2024-02-01 NOTE — Patient Instructions (Signed)
 [facility] at [facility] Discharge Instructions  You were seen today by Pleasant Barefoot PA-C for your history of anemia and your vitamin and mineral deficiencies.  - IRON DEFICIENCY: Your iron levels look great!  You do not need any additional IV iron at this time.  Continue taking your iron-containing multivitamin, but decrease this to take 2 tablets at the same time each morning.  - COPPER  & VITAMIN B12 DEFICIENCY: Your copper  and B12 levels are currently normal.  Continue to take your multivitamin as above.  FOLLOW-UP APPOINTMENT: Labs and office visit in 1 year   Thank you for choosing [facility] at [facility] to provide your oncology and hematology care.  To afford each patient quality time with our provider, please arrive at least 15 minutes before your scheduled appointment time.   If you have a lab appointment with the Cancer Center please come in thru the Main Entrance and check in at the main information desk.  You need to re-schedule your appointment should you arrive 10 or more minutes late.  We strive to give you quality time with our providers, and arriving late affects you and other patients whose appointments are after yours.  Also, if you no show three or more times for appointments you may be dismissed from the clinic at the providers discretion.     Again, thank you for choosing [facility].  Our hope is that these requests will decrease the amount of time that you wait before being seen by our physicians.       _____________________________________________________________  Should you have questions after your visit to [facility], please contact our office at [phone] and follow the prompts.  Our office hours are 8:00 a.m. and 4:30 p.m. Monday - Friday.  Please note that voicemails left after 4:00 p.m. may not be returned until the following business day.  We are closed weekends and major  holidays.  You do have access to a nurse 24-7, just call the main number to the clinic [phone] and do not press any options, hold on the line and a nurse will answer the phone.    For prescription refill requests, have your pharmacy contact our office and allow 72 hours.    Due to Covid, you will need to wear a mask upon entering the hospital. If you do not have a mask, a mask will be given to you at the Main Entrance upon arrival. For doctor visits, patients may have 1 support person age 72 or older with them. For treatment visits, patients can not have anyone with them due to social distancing guidelines and our immunocompromised population.

## 2024-02-07 ENCOUNTER — Encounter: Payer: Self-pay | Admitting: *Deleted

## 2025-02-05 ENCOUNTER — Inpatient Hospital Stay

## 2025-02-12 ENCOUNTER — Inpatient Hospital Stay: Admitting: Physician Assistant
